# Patient Record
Sex: Male | Born: 1935 | Race: White | Hispanic: No | Marital: Married | Smoking: Never smoker
Health system: Southern US, Community
[De-identification: ages and names within clinical notes are randomized; demographics above are authoritative.]

## PROBLEM LIST (undated history)

## (undated) DIAGNOSIS — IMO0001 Reserved for inherently not codable concepts without codable children: Secondary | ICD-10-CM

## (undated) DIAGNOSIS — I429 Cardiomyopathy, unspecified: Secondary | ICD-10-CM

## (undated) DIAGNOSIS — I351 Nonrheumatic aortic (valve) insufficiency: Secondary | ICD-10-CM

## (undated) DIAGNOSIS — E785 Hyperlipidemia, unspecified: Secondary | ICD-10-CM

## (undated) DIAGNOSIS — Z95810 Presence of automatic (implantable) cardiac defibrillator: Secondary | ICD-10-CM

## (undated) DIAGNOSIS — N289 Disorder of kidney and ureter, unspecified: Secondary | ICD-10-CM

## (undated) DIAGNOSIS — J3801 Paralysis of vocal cords and larynx, unilateral: Secondary | ICD-10-CM

## (undated) DIAGNOSIS — J45909 Unspecified asthma, uncomplicated: Secondary | ICD-10-CM

## (undated) DIAGNOSIS — E039 Hypothyroidism, unspecified: Secondary | ICD-10-CM

## (undated) DIAGNOSIS — I509 Heart failure, unspecified: Secondary | ICD-10-CM

## (undated) DIAGNOSIS — C801 Malignant (primary) neoplasm, unspecified: Secondary | ICD-10-CM

## (undated) DIAGNOSIS — I4891 Unspecified atrial fibrillation: Secondary | ICD-10-CM

## (undated) DIAGNOSIS — I950 Idiopathic hypotension: Secondary | ICD-10-CM

## (undated) HISTORY — PX: BLADDER SURGERY: SHX569

## (undated) HISTORY — PX: EYE SURGERY: SHX253

## (undated) HISTORY — PX: CARDIAC DEFIBRILLATOR PLACEMENT: SHX171

---

## 2003-10-07 ENCOUNTER — Other Ambulatory Visit: Payer: Self-pay

## 2003-10-12 ENCOUNTER — Other Ambulatory Visit: Payer: Self-pay

## 2005-06-28 ENCOUNTER — Inpatient Hospital Stay: Payer: Self-pay | Admitting: Cardiology

## 2005-06-28 ENCOUNTER — Other Ambulatory Visit: Payer: Self-pay

## 2005-06-29 ENCOUNTER — Other Ambulatory Visit: Payer: Self-pay

## 2005-09-18 ENCOUNTER — Inpatient Hospital Stay (HOSPITAL_COMMUNITY): Admission: AD | Admit: 2005-09-18 | Discharge: 2005-09-25 | Payer: Self-pay | Admitting: Internal Medicine

## 2005-09-18 ENCOUNTER — Ambulatory Visit: Payer: Self-pay | Admitting: Internal Medicine

## 2005-09-29 ENCOUNTER — Ambulatory Visit: Payer: Self-pay | Admitting: Internal Medicine

## 2005-09-29 ENCOUNTER — Ambulatory Visit (HOSPITAL_COMMUNITY): Admission: RE | Admit: 2005-09-29 | Discharge: 2005-09-29 | Payer: Self-pay | Admitting: Internal Medicine

## 2005-10-09 ENCOUNTER — Ambulatory Visit: Payer: Self-pay | Admitting: Internal Medicine

## 2005-10-18 ENCOUNTER — Ambulatory Visit: Payer: Self-pay | Admitting: Internal Medicine

## 2005-10-27 ENCOUNTER — Ambulatory Visit: Payer: Self-pay | Admitting: Internal Medicine

## 2006-08-25 ENCOUNTER — Other Ambulatory Visit: Payer: Self-pay

## 2006-08-25 ENCOUNTER — Inpatient Hospital Stay: Payer: Self-pay | Admitting: Internal Medicine

## 2007-08-10 IMAGING — CT CT CHEST W/ CM
1 series · 15 of 33 positions shown, 19 images · non-contrast
Comparison: none

REASON FOR EXAM: previous CXR in [HOSPITAL] showed possible lesion in the
lingula left side
COMMENTS:

[Series 2: soft tissue · axial · 0.67mm/px · z∈[-664,-324]mm · 15 of 80 slices shown, 19 images]
[im 6/80  mediastinal]
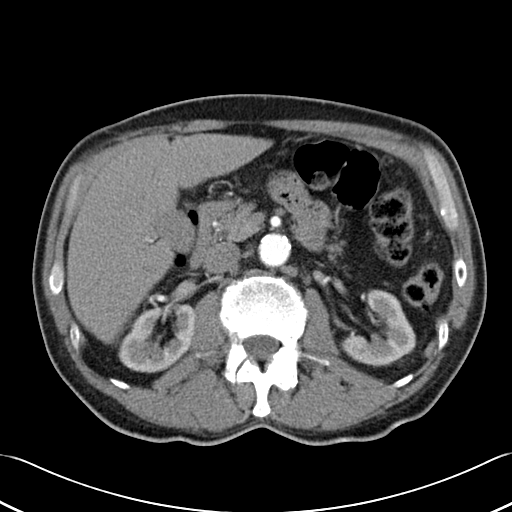
[im 6/80  lung]
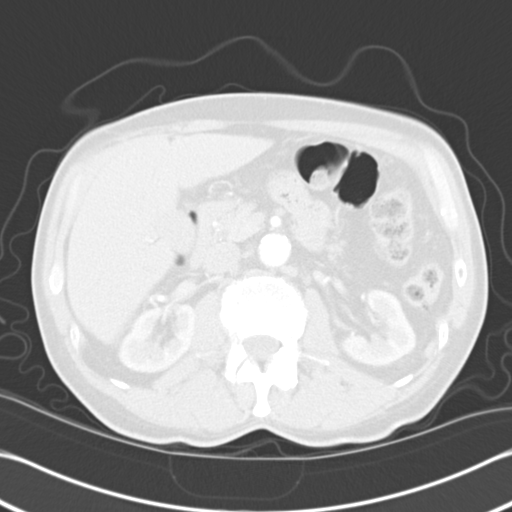
[im 12/80  lung]
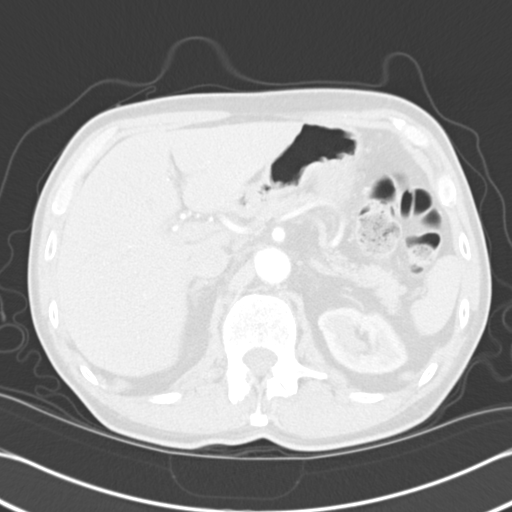
[im 16/80  lung]
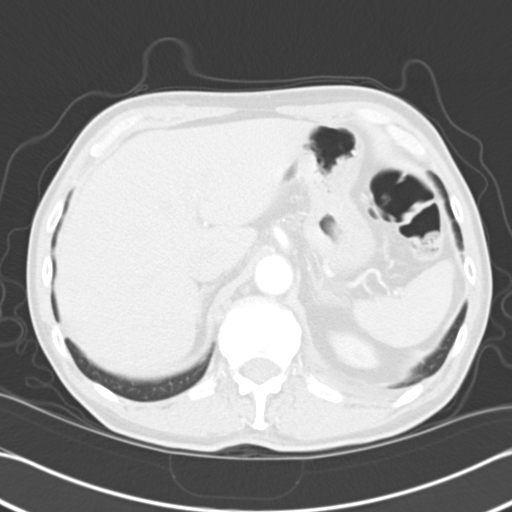
[im 21/80  lung]
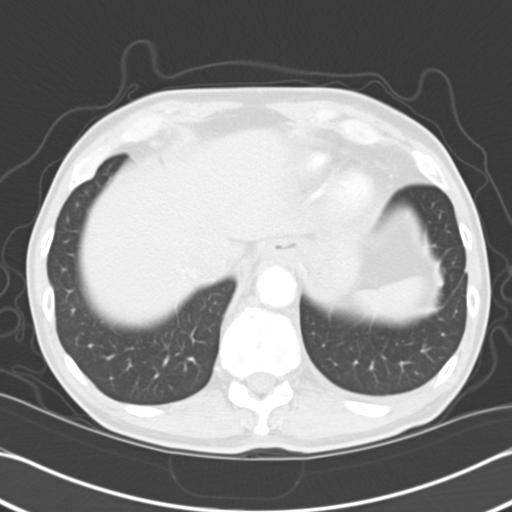
[im 27/80  mediastinal]
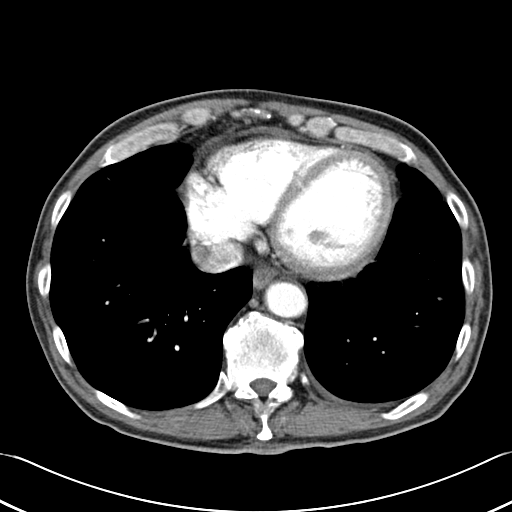
[im 27/80  lung]
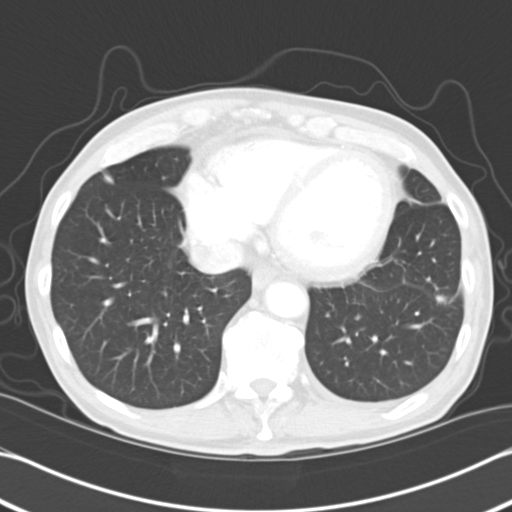
[im 32/80  lung]
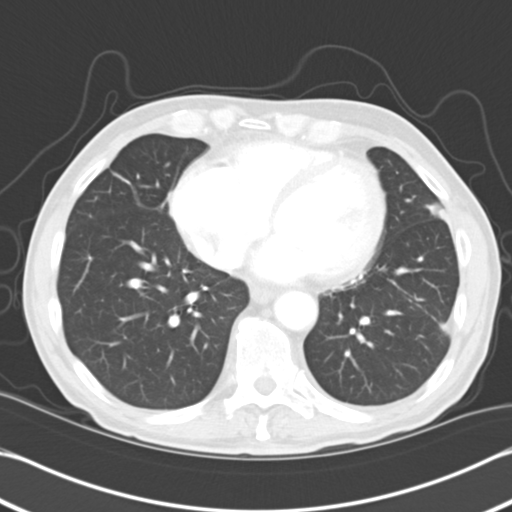
[im 36/80  lung]
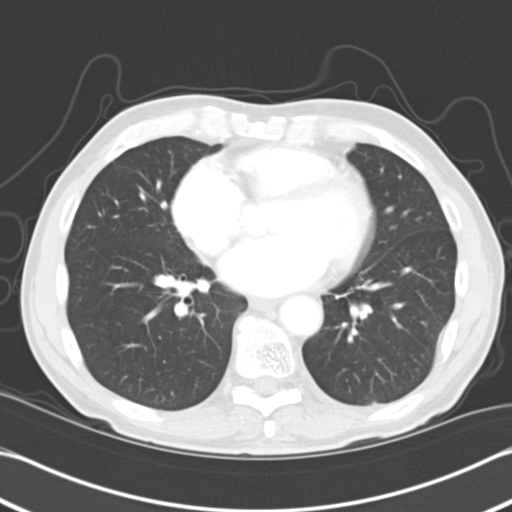
[im 41/80  lung]
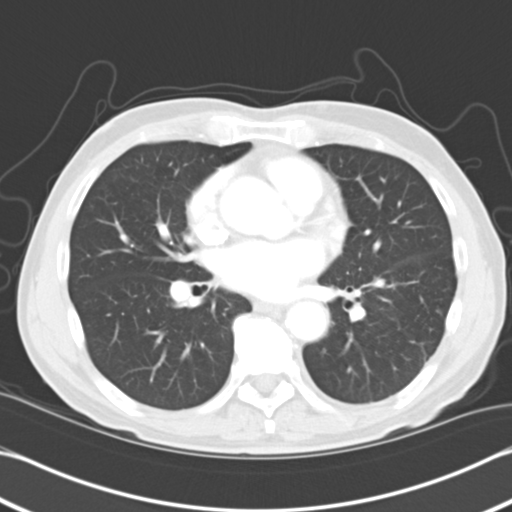
[im 44/80  mediastinal]
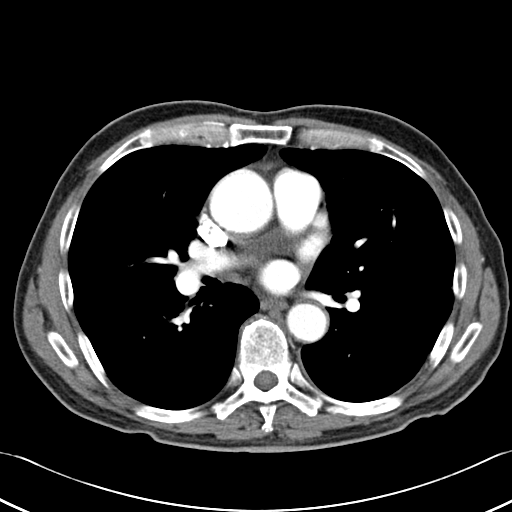
[im 44/80  lung]
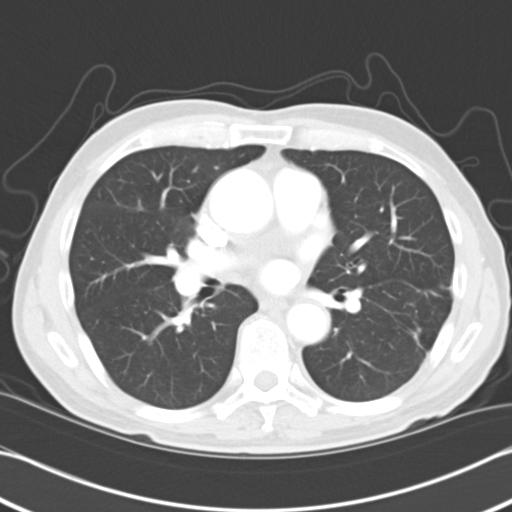
[im 48/80  lung]
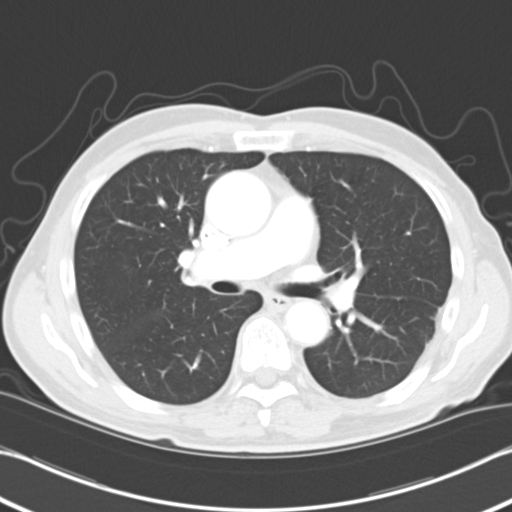
[im 53/80  lung]
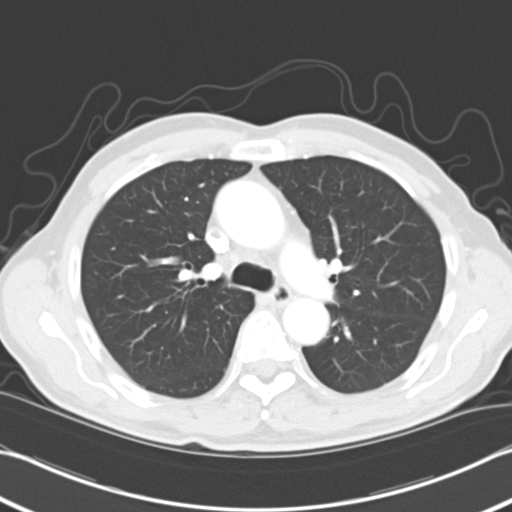
[im 59/80  lung]
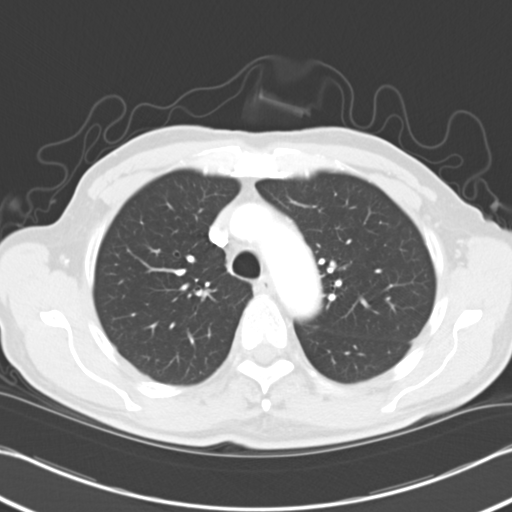
[im 64/80  mediastinal]
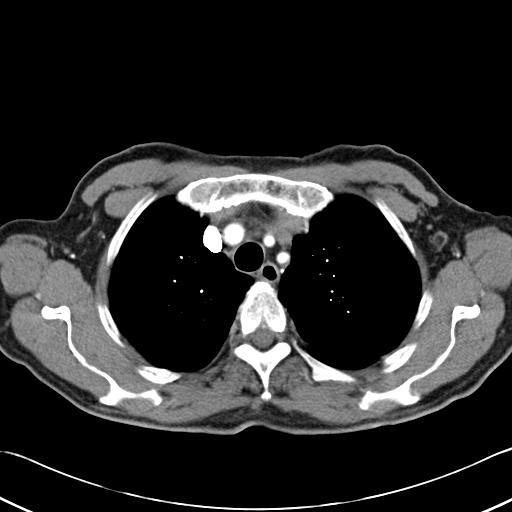
[im 64/80  lung]
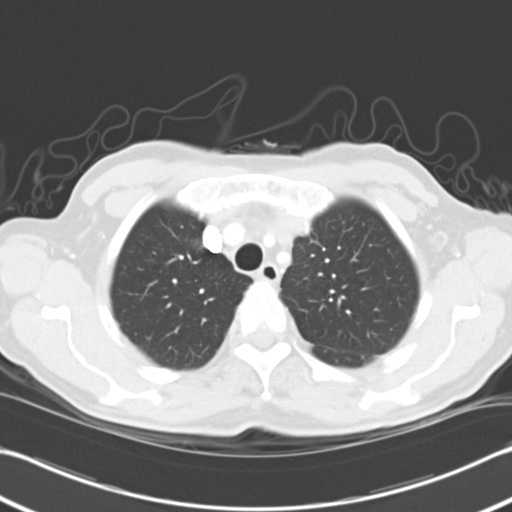
[im 68/80  lung]
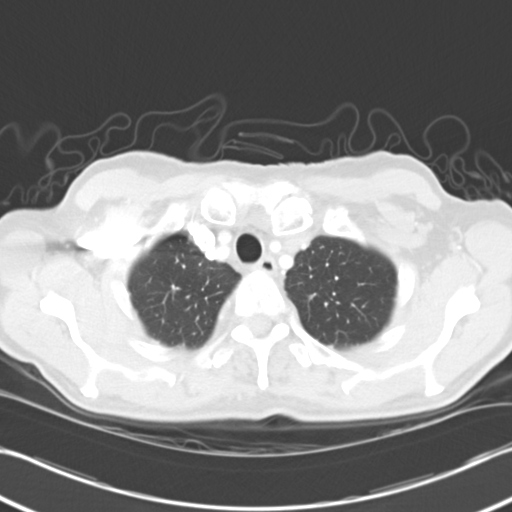
[im 74/80  lung]
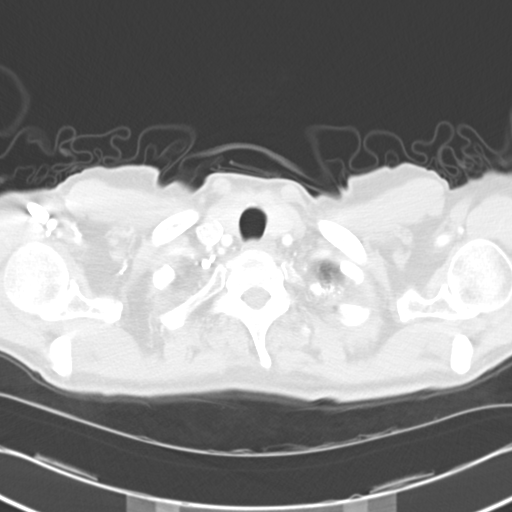

[15 of 33 positions shown; findings below may reference images not displayed]

PROCEDURE:     CT  - CT CHEST WITH CONTRAST  - October 09, 2005  [DATE]

RESULT:     IV contrast enhanced CT scan of the chest is performed.  The
previous chest x-ray from [HOSPITAL] is not available for comparison. There
are some areas of density at the lung apices suggestive of calcifications in
the LEFT lung apex.  Some degenerative spurring is seen in the thoracic
spine.  Some areas of thickening of the fissures are seen but a distinct
mass is not demonstrated. There is a low density area in the posterior
aspect of the RIGHT kidney on image #79, which is too small for further
characterization on this limited study, which is performed for evaluation of
the chest.  The other portions of the upper abdominal viscera appear to
within normal limits aside from some areas of enhancement on image #77 along
the periphery of the RIGHT lobe of the liver.  This could represent a focal
underlying lesion. Correlation with triphasic CT of the abdomen may be
beneficial.  The heart is mildly enlarged. There is no pericardial or
pleural effusion.  No axillary, mediastinal or hilar mass is present.

A small hiatal hernia is present.
IMPRESSION: 1)No definite focal pulmonary parenchymal mass.  Some linear areas of
fibrosis are noted near the base and there is some thickening in the
fissures.  A discrete pulmonary nodule is not identified.

2)Small hiatal hernia.

3)Some minimal low density in the RIGHT kidney, which could be further
evaluated with ultrasound or triphasic CT.

4)Some ill-defined enhancement laterally in the RIGHT lobe of the liver as
described on image #77, which could be further evaluated with triphasic CT.

## 2008-09-15 ENCOUNTER — Ambulatory Visit: Payer: Self-pay | Admitting: Internal Medicine

## 2008-09-27 ENCOUNTER — Emergency Department: Payer: Self-pay | Admitting: Emergency Medicine

## 2008-10-01 ENCOUNTER — Ambulatory Visit: Payer: Self-pay | Admitting: Urology

## 2012-01-29 ENCOUNTER — Ambulatory Visit: Payer: Self-pay | Admitting: Cardiology

## 2012-01-29 LAB — URINALYSIS, COMPLETE
Blood: NEGATIVE
Glucose,UR: NEGATIVE mg/dL (ref 0–75)
Ph: 5 (ref 4.5–8.0)
RBC,UR: 1 /HPF (ref 0–5)
Specific Gravity: 1.021 (ref 1.003–1.030)
WBC UR: 1 /HPF (ref 0–5)

## 2012-01-29 LAB — CBC WITH DIFFERENTIAL/PLATELET
Basophil %: 0.5 %
Eosinophil %: 0.5 %
HGB: 13.9 g/dL (ref 13.0–18.0)
Lymphocyte %: 22.8 %
MCV: 92 fL (ref 80–100)
Monocyte #: 0.5 x10 3/mm (ref 0.2–1.0)
Neutrophil %: 64.5 %
Platelet: 189 10*3/uL (ref 150–440)
WBC: 4 10*3/uL (ref 3.8–10.6)

## 2012-01-29 LAB — BASIC METABOLIC PANEL
Chloride: 106 mmol/L (ref 98–107)
Creatinine: 1.29 mg/dL (ref 0.60–1.30)
Glucose: 87 mg/dL (ref 65–99)
Osmolality: 281 (ref 275–301)

## 2012-02-02 ENCOUNTER — Ambulatory Visit: Payer: Self-pay | Admitting: Cardiology

## 2014-02-16 DIAGNOSIS — I5022 Chronic systolic (congestive) heart failure: Secondary | ICD-10-CM | POA: Diagnosis present

## 2014-02-16 DIAGNOSIS — I4891 Unspecified atrial fibrillation: Secondary | ICD-10-CM | POA: Diagnosis present

## 2014-02-16 DIAGNOSIS — I5023 Acute on chronic systolic (congestive) heart failure: Secondary | ICD-10-CM | POA: Diagnosis present

## 2014-02-16 DIAGNOSIS — I429 Cardiomyopathy, unspecified: Secondary | ICD-10-CM | POA: Insufficient documentation

## 2014-02-16 DIAGNOSIS — Z95810 Presence of automatic (implantable) cardiac defibrillator: Secondary | ICD-10-CM | POA: Insufficient documentation

## 2014-12-06 NOTE — Op Note (Signed)
PATIENT NAME:  Jack Harris, Jack Harris MR#:  [id number] DATE OF BIRTH:  May 09, 1934  DATE OF PROCEDURE:  02/02/2012  ATTENDING: Lennette Bihari L. Marcello Moores, MD   PROCEDURE: Removal and replacement of implantable cardioverter defibrillator.   INDICATION: Implantable cardioverter defibrillator at elective replacement indicator.   FLUOROSCOPY TIME: None utilized.   DETAILS OF PROCEDURE: The patient was brought to the operating room in a fasting, nonsedated state. Informed consent was obtained and placed in the patient's permanent medical record. The patient was prepped and draped in the usual manner. The area of the left infraclavicular fossa was scrubbed with chlorhexidine. Using a scalpel an incision was made over the old incision site. Using a combination of electrocautery and blunt dissection, the old ICD was extracted from the pocket. The pocket was then revised to accommodate the new device. The serial number and device information was obtained from the old device. At this juncture we took the opportunity to reconstruct the pocket to ensure that it would accommodate the new device. We then hooked the right atrial and right ventricular leads to the new device and placed this in the pocket. Testing was performed through the device and showed the following findings.   The atrial lead was tested and showed appropriate sensing and impedances and the thresholds were within normal limits. Right ventricular lead as well was tested and showed adequate sensing, impedances were within normal range and a threshold was unchanged from prior to device implant. The device was programmed as follows. The serial number for the device is as follows. The device was a [id number] [id number], serial number [id number]. The right atrial lead was a Medtronic [id number], model number [id number], implanted 04/22/2007. The right ventricular lead was a 6947 Sprint Quattro, serial number [id number] implanted 04/22/2007. No DFT testing was performed.   The new  device again was implanted 02/02/2012 and programming was as follows. The VF detection was set at 200 with 24 out of 32. There was a VT monitor zone programmed on at 150 beats per minute with no therapies. VF set for ATP during charging and max output 35 joules shocks.   At this juncture the pocket was irrigated with copious amounts of gentamicin solution and adequate hemostasis was obtained. The pocket was then closed with running layers of 2-0 and 3-0 Vicryl and the subcuticular layer with 4-0 Vicryl. Steri-Strips were applied to the incision as well as sterile gauze and OpSite. There were no complications at the conclusion of the procedure and the patient was set to be discharged two hours postprocedure based on the fact if the patient has normal vital signs and there's no significant bleeding.   ____________________________ Priscille Heidelberg. Marcello Moores, MD klt:drc D: 02/02/2012 16:14:54 ET T: 02/02/2012 17:11:27 ET JOB#: [id number]  cc: Lennette Bihari L. Marcello Moores, MD, <Dictator> Marzetta Board MD ELECTRONICALLY SIGNED 03/01/2012 11:12

## 2015-11-29 ENCOUNTER — Other Ambulatory Visit: Payer: Self-pay | Admitting: Unknown Physician Specialty

## 2015-11-29 DIAGNOSIS — J3800 Paralysis of vocal cords and larynx, unspecified: Secondary | ICD-10-CM

## 2015-12-03 ENCOUNTER — Ambulatory Visit: Admission: RE | Admit: 2015-12-03 | Payer: Medicare Other | Source: Ambulatory Visit

## 2015-12-03 ENCOUNTER — Ambulatory Visit: Payer: Medicare Other

## 2015-12-03 ENCOUNTER — Ambulatory Visit
Admission: RE | Admit: 2015-12-03 | Discharge: 2015-12-03 | Disposition: A | Discharge status: Home / Self Care | Payer: Medicare Other | Source: Ambulatory Visit | Attending: Unknown Physician Specialty | Admitting: Unknown Physician Specialty

## 2015-12-03 DIAGNOSIS — Z8551 Personal history of malignant neoplasm of bladder: Secondary | ICD-10-CM | POA: Insufficient documentation

## 2015-12-03 DIAGNOSIS — N200 Calculus of kidney: Secondary | ICD-10-CM | POA: Diagnosis not present

## 2015-12-03 DIAGNOSIS — I700 Atherosclerosis of aorta: Secondary | ICD-10-CM | POA: Insufficient documentation

## 2015-12-03 DIAGNOSIS — I2510 Atherosclerotic heart disease of native coronary artery without angina pectoris: Secondary | ICD-10-CM | POA: Insufficient documentation

## 2015-12-03 DIAGNOSIS — J3800 Paralysis of vocal cords and larynx, unspecified: Secondary | ICD-10-CM | POA: Diagnosis not present

## 2015-12-03 DIAGNOSIS — J90 Pleural effusion, not elsewhere classified: Secondary | ICD-10-CM | POA: Diagnosis not present

## 2015-12-03 HISTORY — DX: Malignant (primary) neoplasm, unspecified: C80.1

## 2015-12-03 HISTORY — DX: Heart failure, unspecified: I50.9

## 2015-12-03 LAB — POCT I-STAT CREATININE: CREATININE: 1.6 mg/dL — AB (ref 0.61–1.24)

## 2015-12-03 MED ORDER — IOPAMIDOL (ISOVUE-300) INJECTION 61%
60.0000 mL | Freq: Once | INTRAVENOUS | Status: AC | PRN
  Administered 2015-12-03: 60 mL via INTRAVENOUS

## 2016-02-29 DIAGNOSIS — Z01810 Encounter for preprocedural cardiovascular examination: Secondary | ICD-10-CM | POA: Diagnosis present

## 2016-02-29 DIAGNOSIS — Z01812 Encounter for preprocedural laboratory examination: Secondary | ICD-10-CM | POA: Diagnosis present

## 2016-02-29 LAB — POTASSIUM: Potassium: 4.5 mmol/L (ref 3.5–5.1)

## 2016-02-29 NOTE — Patient Instructions (Signed)
  Your procedure is scheduled on: March 02, 2016 (Thursday) Report to Same Day Surgery 2nd floor [facility] To find out your arrival time please call [phone] between 1PM - 3PM on March 01, 2016 (Wednesday)  Remember: Instructions that are not followed completely may result in serious medical risk, up to and including death, or upon the discretion of your surgeon and anesthesiologist your surgery may need to be rescheduled.    _x___ 1. Do not eat food or drink liquids after midnight. No gum chewing or hard candies.     _x__ 2. No Alcohol for 24 hours before or after surgery.   _x___3. No Smoking for 24 prior to surgery.   ____  4. Bring all medications with you on the day of surgery if instructed.    __x__ 5. Notify your doctor if there is any change in your medical condition     (cold, fever, infections).     Do not wear jewelry, make-up, hairpins, clips or nail polish.  Do not wear lotions, powders, or perfumes. You may wear deodorant.  Do not shave 48 hours prior to surgery. Men may shave face and neck.  Do not bring valuables to the hospital.    [facility] is not responsible for any belongings or valuables.               Contacts, dentures or bridgework may not be worn into surgery.  Leave your suitcase in the car. After surgery it may be brought to your room.  For patients admitted to the hospital, discharge time is determined by your treatment team.   Patients discharged the day of surgery will not be allowed to drive home.    Please read over the following fact sheets that you were given:   [facility]paring for [facility] and or MRSA Information   _x___ Take these medicines the morning of surgery with A SIP OF WATER:    1. Amiodarone  2.Metoprolol  3.  4.  5.  6.  ____ Fleet Enema (as directed)   ___ Use CHG Soap or sage wipes as directed on instruction sheet   ____ Use inhalers on the day of surgery and bring to hospital day of surgery  ____ Stop  metformin 2 days prior to surgery    ____ Take 1/2 of usual insulin dose the night before surgery and none on the morning of  Surgery          _x___ Stop aspirin or coumadin, or plavix (Patient stated has stopped warfarin on July 14)  _x__ Stop Anti-inflammatories such as Advil, Aleve, Ibuprofen, Motrin, Naproxen,          Naprosyn, Goodies powders or aspirin products. Ok to take Tylenol.   ____ Stop supplements until after surgery.    ____ Bring C-Pap to the hospital.

## 2016-02-29 NOTE — Pre-Procedure Instructions (Signed)
10/04/2015  [place]  Component Name Value Range  LV Ejection Fraction (%) 25   Aortic Valve Stenosis Grade none   Aortic Valve Regurgitation Grade moderate   Mitral Valve Stenosis Grade none   Mitral Valve Regurgitation Grade mild   Tricuspid Valve Regurgitation Grade mild   Tricuspid Valve Regurgitation Max Velocity (m/s) 3.3 m/sec   Right Ventricle Systolic Pressure (mmHg) XX123456 mmHg   LV End Diastolic Diameter (cm) 5.2 cm  LV End Systolic Diameter (cm) 4.4 cm  LV Septum Wall Thickness (cm) 0.7 cm  LV Posterior Wall Thickness (cm) 1 cm  Left Atrium Diameter (cm) 5.1 cm   Result Narrative                  INTERNAL MEDICINE DEPARTMENT                    [facility]                           [id number]                   [facility]                      Acct #: [id number]        [address], [place],  [place] [address]            Date: 10/04/2015 10:19 AM                                                                 Adult Male Age: 82 yrs                    ECHOCARDIOGRAM REPORT                        Outpatient          STUDY:CHEST WALL         TAPE:0000:00: 0:00:00         KC::KCWI           ECHO:Yes   DOPPLER:Yes  FILE:0000-000-000             MD1:  Gilford Rile, JOHN BARRETT          COLOR:Yes  CONTRAST:No       MACHINE:Philips           Height: 72 in      RV BIOPSY:No         3D:No    SOUND QLTY:Moderate          Weight: 165 lb         MEDIUM:None                                                                 BSA: 2.0  m2 ___________________________________________________________________________________________      HISTORY:DOE       REASON:Assess, LV function   INDICATION:Sleeps in sitting position due to orthopnea [R06.01 (ICD-10-CM)]  ___________________________________________________________________________________________ ECHOCARDIOGRAPHIC MEASUREMENTS 2D DIMENSIONS AORTA             Values       Normal Range      MAIN PA          Values      Normal Range           Annulus:  nm*       [2.3 - 2.9]                PA Main:  nm*       [1.5 - 2.1]         Aorta Sin:  nm*       [3.1 - 3.7]       RIGHT VENTRICLE       ST Junction:  nm*       [2.6 - 3.2]                RV Base:  nm*       [ < 4.2]         Asc.Aorta:  nm*       [2.6 - 3.4]                 RV Mid:  nm*       [ < 3.5]  LEFT VENTRICLE                                        RV Length:  nm*       [ < 8.6]             LVIDd:  5.2 cm    [4.2 - 5.9]       INFERIOR VENA CAVA             LVIDs:  4.4 cm                              Max. IVC:  nm*       [ <= 2.1]                FS:  15.4 %    [> 25]                    Min. IVC:  nm*               SWT:  0.70 cm   [0.6 - 1.0]                   ------------------               PWT:  1.0 cm    [0.6 - 1.0]                   nm* - not measured  LEFT ATRIUM           LA Diam:  5.1 cm    [3.0 - 4.0]       LA A4C Area:  nm*       [ < 20]         LA Volume:  nm*       Roice.Felt - 58]  ___________________________________________________________________________________________ ECHOCARDIOGRAPHIC DESCRIPTIONS  AORTIC ROOT         Size:Normal   Dissection:INDETERM FOR DISSECTION  AORTIC VALVE     Leaflets:Tricuspid            Morphology:Normal     Mobility:Fully mobile  LEFT VENTRICLE         Size:Normal                 Anterior:Normal  Contraction:Normal                  Lateral:Normal   Closest EF:25% (Estimated)          Septal:Normal    LV Masses:No Masses                Apical:Normal          [id number]                   Inferior:Normal                                    Posterior:Normal Dias.FxClass:can't be determined  MITRAL VALVE     Leaflets:Normal                 Mobility:Fully mobile   Morphology:Normal  LEFT ATRIUM         Size:MODERATELY ENLARGED   LA Masses:No masses    IA Septum:Normal IAS  MAIN PA         Size:Normal  PULMONIC VALVE   Morphology:Normal                  Mobility:Fully mobile  RIGHT VENTRICLE    RV Masses:No Masses                  Size:MODERATELY ENLARGED    Free Wall:HYPOCONTRACTILE     Contraction:Normal  TRICUSPID VALVE     Leaflets:Normal                 Mobility:Fully mobile   Morphology:Normal  RIGHT ATRIUM         Size:MODERATELY ENLARGED    RA Other:CATHETER      RA Mass:No masses  PERICARDIUM        Fluid:No effusion  INFERIOR VENACAVA         Size:DILATED Normal respiratory collapse   ________________________________________________________________ DOPPLER ECHO and OTHER SPECIAL PROCEDURES    Aortic:MODERATE AR                No AS     Mitral:MILD MR                    No MS           MV Inflow E Vel=nm*        MV Annulus E'Vel=nm*           E/E'Ratio=nm*  Tricuspid:MILD TR                    No TS           331.0 cm/sec peak TR vel   53.8 mmHg peak RV pressure  Pulmonary:TRIVIAL PR                 No PS     ___________________________________________________________________________________________ INTERPRETATION SEVERE LV SYSTOLIC DYSFUNCTION (See above) MILD RV SYSTOLIC DYSFUNCTION (See above) MODERATE VALVULAR REGURGITATION (See above) NO VALVULAR STENOSIS   ___________________________________________________________________________________________ Electronically signed  by: MD Miquel Dunn on 10/05/2015 04:16 PM             Performed By: Scherrie November, RCS       Ordering Physician: Myrla Halsted  ___________________________________________________________________________________________   Status Results Details   Encounter Summary   Copy of echo

## 2016-02-29 NOTE — Pre-Procedure Instructions (Signed)
PREOP EKG FAXED TO DR Nehemiah Massed FOR REVIEW. HAD ALREADY RECEIVED CLEARANCE

## 2016-03-01 ENCOUNTER — Encounter
Admission: RE | Admit: 2016-03-01 | Discharge: 2016-03-01 | Disposition: A | Discharge status: Home / Self Care | Payer: 59 | Source: Ambulatory Visit | Attending: Unknown Physician Specialty | Admitting: Unknown Physician Specialty

## 2016-03-01 DIAGNOSIS — Z01810 Encounter for preprocedural cardiovascular examination: Secondary | ICD-10-CM | POA: Insufficient documentation

## 2016-03-01 DIAGNOSIS — Z01812 Encounter for preprocedural laboratory examination: Secondary | ICD-10-CM | POA: Insufficient documentation

## 2016-03-01 HISTORY — DX: Nonrheumatic aortic (valve) insufficiency: I35.1

## 2016-03-01 HISTORY — DX: Hyperlipidemia, unspecified: E78.5

## 2016-03-01 HISTORY — DX: Paralysis of vocal cords and larynx, unilateral: J38.01

## 2016-03-01 HISTORY — DX: Hypothyroidism, unspecified: E03.9

## 2016-03-01 HISTORY — DX: Reserved for inherently not codable concepts without codable children: IMO0001

## 2016-03-01 HISTORY — DX: Unspecified atrial fibrillation: I48.91

## 2016-03-01 HISTORY — DX: Unspecified asthma, uncomplicated: J45.909

## 2016-03-01 HISTORY — DX: Presence of automatic (implantable) cardiac defibrillator: Z95.810

## 2016-03-01 HISTORY — DX: Cardiomyopathy, unspecified: I42.9

## 2016-03-01 LAB — PROTIME-INR
INR: 1.69
PROTHROMBIN TIME: 19.9 s — AB (ref 11.4–15.0)

## 2016-03-02 ENCOUNTER — Ambulatory Visit: Payer: Medicare Other | Admitting: Anesthesiology

## 2016-03-02 ENCOUNTER — Ambulatory Visit
Admission: RE | Admit: 2016-03-02 | Discharge: 2016-03-02 | Disposition: A | Discharge status: Home / Self Care | Payer: Medicare Other | Source: Ambulatory Visit | Attending: Unknown Physician Specialty | Admitting: Unknown Physician Specialty

## 2016-03-02 ENCOUNTER — Encounter: Payer: Self-pay | Admitting: *Deleted

## 2016-03-02 ENCOUNTER — Encounter
Admission: RE | Disposition: A | Discharge status: Home / Self Care | Payer: Self-pay | Source: Ambulatory Visit | Attending: Unknown Physician Specialty

## 2016-03-02 DIAGNOSIS — I429 Cardiomyopathy, unspecified: Secondary | ICD-10-CM | POA: Diagnosis not present

## 2016-03-02 DIAGNOSIS — I351 Nonrheumatic aortic (valve) insufficiency: Secondary | ICD-10-CM | POA: Insufficient documentation

## 2016-03-02 DIAGNOSIS — Z8709 Personal history of other diseases of the respiratory system: Secondary | ICD-10-CM | POA: Insufficient documentation

## 2016-03-02 DIAGNOSIS — I509 Heart failure, unspecified: Secondary | ICD-10-CM | POA: Diagnosis not present

## 2016-03-02 DIAGNOSIS — I4891 Unspecified atrial fibrillation: Secondary | ICD-10-CM | POA: Diagnosis not present

## 2016-03-02 DIAGNOSIS — Z95810 Presence of automatic (implantable) cardiac defibrillator: Secondary | ICD-10-CM | POA: Insufficient documentation

## 2016-03-02 DIAGNOSIS — E039 Hypothyroidism, unspecified: Secondary | ICD-10-CM | POA: Diagnosis not present

## 2016-03-02 DIAGNOSIS — R490 Dysphonia: Secondary | ICD-10-CM | POA: Insufficient documentation

## 2016-03-02 DIAGNOSIS — J3801 Paralysis of vocal cords and larynx, unilateral: Secondary | ICD-10-CM | POA: Diagnosis not present

## 2016-03-02 DIAGNOSIS — E785 Hyperlipidemia, unspecified: Secondary | ICD-10-CM | POA: Insufficient documentation

## 2016-03-02 DIAGNOSIS — Z85828 Personal history of other malignant neoplasm of skin: Secondary | ICD-10-CM | POA: Diagnosis not present

## 2016-03-02 DIAGNOSIS — R0602 Shortness of breath: Secondary | ICD-10-CM | POA: Diagnosis not present

## 2016-03-02 DIAGNOSIS — Z8551 Personal history of malignant neoplasm of bladder: Secondary | ICD-10-CM | POA: Diagnosis not present

## 2016-03-02 HISTORY — PX: THYROPLASTY: SHX2512

## 2016-03-02 SURGERY — THYROPLASTY
Anesthesia: General | Laterality: Left

## 2016-03-02 MED ORDER — FAMOTIDINE 20 MG PO TABS
20.0000 mg | ORAL_TABLET | Freq: Once | ORAL | Status: AC
  Administered 2016-03-02: 20 mg via ORAL

## 2016-03-02 MED ORDER — FENTANYL CITRATE (PF) 100 MCG/2ML IJ SOLN
INTRAMUSCULAR | Status: DC | PRN
  Administered 2016-03-02: 50 ug via INTRAVENOUS

## 2016-03-02 MED ORDER — EPINEPHRINE HCL 1 MG/ML IJ SOLN
INTRAMUSCULAR | Status: AC
  Filled 2016-03-02: qty 1

## 2016-03-02 MED ORDER — FENTANYL CITRATE (PF) 100 MCG/2ML IJ SOLN: 25.0000 ug | INTRAMUSCULAR | Status: DC | PRN

## 2016-03-02 MED ORDER — FAMOTIDINE 20 MG PO TABS
ORAL_TABLET | ORAL | Status: AC
  Filled 2016-03-02: qty 1

## 2016-03-02 MED ORDER — PROPOFOL 10 MG/ML IV BOLUS
INTRAVENOUS | Status: DC | PRN
  Administered 2016-03-02: 20 mg via INTRAVENOUS

## 2016-03-02 MED ORDER — OXYCODONE HCL 5 MG/5ML PO SOLN: 5.0000 mg | Freq: Once | ORAL | Status: DC | PRN

## 2016-03-02 MED ORDER — LIDOCAINE 2% (20 MG/ML) 5 ML SYRINGE
INTRAMUSCULAR | Status: DC | PRN
  Administered 2016-03-02: 50 mg via INTRAVENOUS

## 2016-03-02 MED ORDER — LACTATED RINGERS IV SOLN
INTRAVENOUS | Status: DC
  Administered 2016-03-02: 06:00:00 via INTRAVENOUS

## 2016-03-02 MED ORDER — OXYCODONE HCL 5 MG PO TABS: 5.0000 mg | ORAL_TABLET | Freq: Once | ORAL | Status: DC | PRN

## 2016-03-02 MED ORDER — LIDOCAINE-EPINEPHRINE 1 %-1:100000 IJ SOLN
INTRAMUSCULAR | Status: AC
  Filled 2016-03-02: qty 1

## 2016-03-02 MED ORDER — LIDOCAINE-EPINEPHRINE 1 %-1:100000 IJ SOLN
INTRAMUSCULAR | Status: DC | PRN
  Administered 2016-03-02: 4 mL

## 2016-03-02 MED ORDER — BACITRACIN ZINC 500 UNIT/GM EX OINT
TOPICAL_OINTMENT | CUTANEOUS | Status: AC
  Filled 2016-03-02: qty 28.35

## 2016-03-02 MED ORDER — OXYCODONE-ACETAMINOPHEN 5-325 MG PO TABS: 1.0000 | ORAL_TABLET | ORAL | Status: DC | PRN

## 2016-03-02 MED ORDER — CEPHALEXIN 500 MG PO CAPS: 500.0000 mg | ORAL_CAPSULE | Freq: Three times a day (TID) | ORAL | Status: DC

## 2016-03-02 MED ORDER — MIDAZOLAM HCL 5 MG/5ML IJ SOLN
INTRAMUSCULAR | Status: DC | PRN
  Administered 2016-03-02: 1 mg via INTRAVENOUS

## 2016-03-02 SURGICAL SUPPLY — 43 items
BLADE OSC/SAGITTAL MD 5.5X18 (BLADE) ×2 IMPLANT
BLADE SAW 2.5X18X0.5 (BLADE) ×2 IMPLANT
BLADE SURG 15 STRL LF DISP TIS (BLADE) ×1 IMPLANT
BLADE SURG 15 STRL SS (BLADE) ×3
CANISTER SUCT 1200ML W/VALVE (MISCELLANEOUS) ×3 IMPLANT
CORD BIP STRL DISP 12FT (MISCELLANEOUS) ×3 IMPLANT
DRAPE MAG INST 16X20 L/F (DRAPES) ×3 IMPLANT
ELECT REM PT RETURN 9FT ADLT (ELECTROSURGICAL) ×3
ELECTRODE REM PT RTRN 9FT ADLT (ELECTROSURGICAL) ×1 IMPLANT
FORCEPS JEWEL BIP 4-3/4 STR (INSTRUMENTS) ×3 IMPLANT
GLOVE BIO SURGEON STRL SZ7.5 (GLOVE) ×3 IMPLANT
GOWN STRL REUS W/ TWL LRG LVL3 (GOWN DISPOSABLE) ×2 IMPLANT
GOWN STRL REUS W/TWL LRG LVL3 (GOWN DISPOSABLE) ×6
HARMONIC SCALPEL FOCUS (MISCELLANEOUS) ×3 IMPLANT
JACKSON PRATT 10 (INSTRUMENTS) ×1 IMPLANT
LABEL OR SOLS (LABEL) ×1 IMPLANT
LIQUID BAND (GAUZE/BANDAGES/DRESSINGS) ×3 IMPLANT
LOOP RED MAXI  1X406MM (MISCELLANEOUS)
LOOP VESSEL MAXI 1X406 RED (MISCELLANEOUS) ×1 IMPLANT
MARKER SKIN DUAL TIP RULER LAB (MISCELLANEOUS) ×3 IMPLANT
MONTOGOMERY THYROPLASTY IMPLANT SYSTEM MEASURING DEVICE KIT ×4 IMPLANT
NDL FILTER BLUNT 18X1 1/2 (NEEDLE) ×1 IMPLANT
NEEDLE FILTER BLUNT 18X 1/2SAF (NEEDLE) ×2
NEEDLE FILTER BLUNT 18X1 1/2 (NEEDLE) ×1 IMPLANT
NS IRRIG 500ML POUR BTL (IV SOLUTION) ×3 IMPLANT
PACK HEAD/NECK (MISCELLANEOUS) ×3 IMPLANT
PATTIES SURGICAL .5 X.5 (GAUZE/BANDAGES/DRESSINGS) ×3 IMPLANT
RETRACTOR STAY HOOK 5MM (MISCELLANEOUS) ×1 IMPLANT
SPONGE XRAY 4X4 16PLY STRL (MISCELLANEOUS) ×3 IMPLANT
STAPLER SKIN PROX 35W (STAPLE) ×1 IMPLANT
SUCTION FRAZIER HANDLE 10FR (MISCELLANEOUS) ×2
SUCTION TUBE FRAZIER 10FR DISP (MISCELLANEOUS) ×1 IMPLANT
SUT SILK 0 (SUTURE)
SUT SILK 0 30XBRD TIE 6 (SUTURE) ×1 IMPLANT
SUT SILK 2 0 (SUTURE) ×3
SUT SILK 2-0 30XBRD TIE 12 (SUTURE) ×1 IMPLANT
SUT VIC AB 3-0 SH 27 (SUTURE)
SUT VIC AB 3-0 SH 27X BRD (SUTURE) ×2 IMPLANT
SUT VIC AB 4-0 RB1 18 (SUTURE) ×6 IMPLANT
SYR 3ML LL SCALE MARK (SYRINGE) ×3 IMPLANT
SYSTEM CHEST DRAIN TLS 7FR (DRAIN) ×3 IMPLANT
THYROPLASTY /INST/S (MISCELLANEOUS) ×3 IMPLANT
Thyroplasty Implant Male 9 ×2 IMPLANT

## 2016-03-02 NOTE — Anesthesia Postprocedure Evaluation (Signed)
Anesthesia Post Note  Patient: Jack Harris [facility]  Procedure(s) Performed: Procedure(s) (LRB): THYROPLASTY (Left)  Patient location during evaluation: PACU Anesthesia Type: General Level of consciousness: awake and alert Pain management: pain level controlled Vital Signs Assessment: post-procedure vital signs reviewed and stable Respiratory status: spontaneous breathing, nonlabored ventilation, respiratory function stable and patient connected to nasal cannula oxygen Cardiovascular status: blood pressure returned to baseline and stable Postop Assessment: no signs of nausea or vomiting Anesthetic complications: no    Last Vitals:  Filed Vitals:   03/02/16 0852 03/02/16 0900  BP: 116/91 118/96  Pulse: 91 91  Temp: 36.6 C 36.4 C  Resp: 22 18    Last Pain:  Filed Vitals:   03/02/16 0906  PainSc: 0-No pain                 Jack Harris

## 2016-03-02 NOTE — H&P (Signed)
  H+P  Reviewed and will be scanned in later. No changes noted. 

## 2016-03-02 NOTE — Op Note (Signed)
03/02/2016  8:23 AM    Latanya Maudlin  AS:[id number]   Pre-Op Dx: HOARSENESS, VOCAL CORD PARALYSIS  Post-op Dx: SAME  Proc: Left thyroplasty using the [employer] thyroplasty implant system   Surg:  Tamanna Whitson T   Asst.: Vaught  Anes:  GOT  EBL:  Less than 10 cc  Comp:  None  Findings:  Normal neck anatomy  Procedure: Mr. Kump was identified in the holding area taken the operating room placed in supine position. The anterior neck was prepped and draped sterilely. Incision line was marked over the left lateral aspect of the thyroid cartilage. A local anesthetic of 1% lidocaine with 1 100,000 epinephrine was used to inject along the incision line a total of 4 cc was used. Approximate 5 minutes a 15 blade was used to incise down to and through the platysma muscle. Hemostasis was achieved using the bipolar cautery. The strap muscles were divided in the midline and retracted over the left lateral aspect of the thyroid cartilage the thyrohyoid muscle was identified and transected inferiorly allowing access to the left lateral alae of the thyroid cartilage. Using the [facility] thyroplasty implant system the key point was identified on the lateral aspect of the thyroid cartilage. The window was then marked and then using the Stryker saw the window was created. The perichondrium on the inner surface of the thyroid a left was gently elevated. Using the sizing tools starting at a #8 and going up to a #12 were all inserted into the window it was felt that the #9 gave the best voice with the least stridor. Therefore #9 implant was placed through the window and snapped nicely into position. This gave excellent voice with minimal stridor. The wounds and copiously irrigated with saline with no active bleeding a #7 TLS drain was brought of the wound inferiorly the thyrohyoid muscle was reattached using 4-0 Vicryl strap muscles were reapproximated in the midline platysma layer was closed using 4-0  Vicryl subcutaneous later closed using 4-0 Vicryl and the skin closed using skin glue. The patient was in return anesthesia where he was awakened in the operating room taken recovery room in stable condition  Cultures: None  Specimen: None  Dispo:   Good  Plan:  Discharge to home follow-up 1 day for drain removal  Latacha Texeira T  03/02/2016 8:23 AM

## 2016-03-02 NOTE — Discharge Instructions (Signed)
Change Drain every 6 to 8 hours   AMBULATORY SURGERY  DISCHARGE INSTRUCTIONS   1) The drugs that you were given will stay in your system until tomorrow so for the next 24 hours you should not:  A) Drive an automobile B) Make any legal decisions C) Drink any alcoholic beverage   2) You may resume regular meals tomorrow.  Today it is better to start with liquids and gradually work up to solid foods.  You may eat anything you prefer, but it is better to start with liquids, then soup and crackers, and gradually work up to solid foods.   3) Please notify your doctor immediately if you have any unusual bleeding, trouble breathing, redness and pain at the surgery site, drainage, fever, or pain not relieved by medication.    4) Additional Instructions:        Please contact your physician with any problems or Same Day Surgery at [phone], Monday through Friday 6 am to 4 pm, or [place] at [facility] number at [phone].

## 2016-03-02 NOTE — Transfer of Care (Signed)
Immediate Anesthesia Transfer of Care Note  Patient: Jack Harris [facility]  Procedure(s) Performed: Procedure(s): THYROPLASTY (Left)  Patient Location: PACU  Anesthesia Type:MAC  Level of Consciousness: awake and alert   Airway & Oxygen Therapy: Patient Spontanous Breathing  Post-op Assessment: Report given to RN and Post -op Vital signs reviewed and stable  Post vital signs: Reviewed  Last Vitals:  Filed Vitals:   03/02/16 0610 03/02/16 0823  BP: 120/79 119/88  Pulse: 96 91  Temp: 36.6 C 36.7 C  Resp: 16 10    Last Pain: There were no vitals filed for this visit.       Complications: No apparent anesthesia complications

## 2016-03-02 NOTE — Anesthesia Preprocedure Evaluation (Signed)
Anesthesia Evaluation  Patient identified by MRN, date of birth, ID band Patient awake    Reviewed: Allergy & Precautions, H&P , NPO status , Patient's Chart, lab work & pertinent test results  History of Anesthesia Complications Negative for: history of anesthetic complications  Airway Mallampati: II  TM Distance: >3 FB Neck ROM: limited    Dental  (+) Poor Dentition, Chipped, Missing, Upper Dentures, Lower Dentures   Pulmonary shortness of breath, asthma ,    Pulmonary exam normal breath sounds clear to auscultation       Cardiovascular Exercise Tolerance: Poor (-) angina+CHF and + DOE  (-) Past MI Normal cardiovascular exam+ pacemaker + Cardiac Defibrillator III Rhythm:regular Rate:Normal     Neuro/Psych negative neurological ROS  negative psych ROS   GI/Hepatic negative GI ROS, Neg liver ROS, neg GERD  ,  Endo/Other  Hypothyroidism   Renal/GU negative Renal ROS  negative genitourinary   Musculoskeletal   Abdominal   Peds  Hematology negative hematology ROS (+)   Anesthesia Other Findings Past Medical History:   CHF (congestive heart failure) (HCC)                         Aortic insufficiency                                         Cardiomyopathy (HCC)                                         Atrial fibrillation (HCC)                                    Shortness of breath dyspnea                                  Asthma                                                         Comment:childhood asthma   Hypothyroidism                                               Paralysis of vocal cords and larynx, unilateral              Cancer ([place])                                                   Comment:bladder, 10 yrs ago   Cancer ([place])  Comment:squamous cell carcinoma   Hyperlipidemia                                               AICD (automatic  cardioverter/defibrillator) pr*                Comment:MEDTRONIC  Past Surgical History:   BLADDER SURGERY                                                 Comment:Surgeon:Dr. Bernardo Heater, Location [facility]    EYE SURGERY                                     Bilateral                Comment:Cataract Extraction with [place]     Reproductive/Obstetrics negative OB ROS                             Anesthesia Physical Anesthesia Plan  ASA: IV  Anesthesia Plan: MAC   Post-op Pain Management:    Induction:   Airway Management Planned:   Additional Equipment:   Intra-op Plan:   Post-operative Plan:   Informed Consent: I have reviewed the patients History and Physical, chart, labs and discussed the procedure including the risks, benefits and alternatives for the proposed anesthesia with the patient or authorized representative who has indicated his/her understanding and acceptance.   Dental Advisory Given  Plan Discussed with: Anesthesiologist, CRNA and Surgeon  Anesthesia Plan Comments:         Anesthesia Quick Evaluation

## 2016-03-06 ENCOUNTER — Encounter: Payer: Self-pay | Admitting: Unknown Physician Specialty

## 2016-05-02 ENCOUNTER — Ambulatory Visit: Payer: 59 | Attending: Unknown Physician Specialty | Admitting: Speech Pathology

## 2016-05-02 DIAGNOSIS — R490 Dysphonia: Secondary | ICD-10-CM | POA: Insufficient documentation

## 2016-05-03 ENCOUNTER — Encounter: Payer: Self-pay | Admitting: Speech Pathology

## 2016-05-03 NOTE — Therapy (Signed)
[place] [facility] MAIN [facility] SERVICES [address] [place], [place], [address] Phone: [phone]   Fax:  [phone]  Speech Language Pathology Evaluation  Patient Details  Name: Jack Harris MRN: [id number] Date of Birth: April 03, 1934 Referring Provider: Dr. Jenne Campus  Encounter Date: 05/02/2016      End of Session - 05/03/16 1317    Visit Number 1   Number of Visits 17   Date for SLP Re-Evaluation 07/02/16   SLP Start Time 1400   SLP Stop Time  1500   SLP Time Calculation (min) 60 min   Activity Tolerance Patient tolerated treatment well      Past Medical History:  Diagnosis Date  . AICD (automatic cardioverter/defibrillator) present    MEDTRONIC  . Aortic insufficiency   . Asthma    childhood asthma  . Atrial fibrillation (HCC)   . Cancer (HCC)    bladder, 10 yrs ago  . Cancer (HCC)    squamous cell carcinoma  . Cardiomyopathy (HCC)   . CHF (congestive heart failure) (HCC)   . Hyperlipidemia   . Hypothyroidism   . Paralysis of vocal cords and larynx, unilateral   . Shortness of breath dyspnea     Past Surgical History:  Procedure Laterality Date  . BLADDER SURGERY     Surgeon:Dr. Lonna Cobb, Location [facility]   . CARDIAC DEFIBRILLATOR PLACEMENT     [facility]  . EYE SURGERY Bilateral    Cataract Extraction with IOL  . THYROPLASTY Left 03/02/2016   Procedure: THYROPLASTY;  Surgeon: Linus Salmons, MD;  Location: [facility] ORS;  Service: ENT;  Laterality: Left;    There were no vitals filed for this visit.          SLP Evaluation [facility] - 05/03/16 0001      SLP Visit Information   SLP Received On 05/02/16   Referring Provider Dr. Jenne Campus   Onset Date 04/19/2016   Medical Diagnosis Left vocal cord paralysis     Subjective   Subjective "I just want to be able to talk"     General Information   HPI 11/08/2015 the patient was seen by his internist secondary "lost his voice back in mid feb. states he was given lasix for  increased fluid and it affected his voice. states it has never returned. denies cough or cold symptoms"  He was referred to ENT (Dr. Jenne Campus) and seen 12/20/2015 and was found to have left vocal cord paralysis.  Dr. Jenne Campus performed left thyroplasty 03/02/2016.  The patient states he voice has never returned.     Prior Functional Status   Cognitive/Linguistic Baseline Within functional limits     Oral Motor/Sensory Function   Overall Oral Motor/Sensory Function Appears within functional limits for tasks assessed     Motor Speech   Overall Motor Speech Impaired   Respiration Impaired   Level of Impairment Conversation   Phonation Aphonic;Breathy;Hoarse;Low vocal intensity   Resonance Within functional limits   Articulation Within functional limitis   Intelligibility Intelligible   Phonation Impaired   Vocal Abuses --  Chronic whispering   Tension Present Jaw;Neck;Shoulder   Volume Soft   Pitch High     Standardized Assessments   Standardized Assessments  Other Assessment  Perceptual Voice Evaluation      Perceptual Voice Evaluation  Voice checklist: . Health risks: does not identify health risks . Characteristic voice use: speaks in large groups (Sunday School, giving reports to groups he belongs to) . Environmental risks: wife is  hard of hearing with loud TV . Misuse: strained voice . Abuse: whsipering . Vocal characteristics: breathy, aphonic, hypophonic, limited voice range  Maximum phonation time for sustained "ah": 4 seconds  Average fundamental frequency during sustained "ah": 170 Hz (1.5 STD above average for age and gender)   Average time patient was able to sustain /s/: 10 seconds  Average time patient was able to sustain /z/: Not able to generate voiced fricative  s/z ratio : N/A  Visi-Pitch: Multi-Dimensional Voice Program (MDVP)  MDVPT extracts objective quantitative values (Relative Average Perturbation, Shimmer, Voice Turbulence Index, and Noise to Harmonic  Ratio) on sustained phonation, which are displayed graphically and numerically in comparison to a built-in normative database.  The patient exhibited values outside the norm for Relative Average Perturbation, Shimmer, Voice Turbulence Index, and Noise to Harmonic Ratio.  Average fundamental frequency was 1.5 STD above the average for age and gender. The patient improved all parameters when cued to alter voicing (whooping, siren sound)   Stimulability: Improved voicing with whooping/siren sound; strong voluntary cough      SLP Education - May 17, 2016 1315    Education provided Yes   Education Details Role of speech therapy in voice remediation   Person(s) Educated Patient   Methods Explanation   Comprehension Verbalized understanding            SLP Long Term Goals - May 17, 2016 1320      SLP LONG TERM GOAL #1   Title The patient will demonstrate independent understanding of vocal hygiene concepts and neck, shoulder, lingual stretching exercises.   Time 8   Period Weeks   Status New     SLP LONG TERM GOAL #2   Title The patient will be independent for abdominal breathing and breath support exercises.   Time 8   Period Weeks   Status New     SLP LONG TERM GOAL #3   Title The patient will minimize vocal tension via Yawn-Sigh approach (or comparable technique) with min SLP cues with 80% accuracy.   Time 8   Period Weeks   Status New     SLP LONG TERM GOAL #4   Title The patient will maintain relaxed phonation / oral resonance for paragraph length recitation with 80% accuracy.   Time 8   Period Weeks   Status New          Plan - 2016-05-17 1318    Clinical Impression Statement This 82 year old man with left vocal fold paralysis, S/P left thyroplasty 03/02/2016, is presenting with severe dysphonia.  The patient demonstrates hoarse/breathy vocal quality, reduced breath control for speech, strained/tense phonation, limited pitch range, and laryngeal tension. He will benefit from voice  therapy for education, to improve breath support, improve tone focus, promote easy flow phonation, and learn techniques to increase loudness and pitch range without strain.   Speech Therapy Frequency 2x / week   Duration Other (comment)  8 weeks   Treatment/Interventions Patient/family education;SLP instruction and feedback;Other (comment)  Voice therapy   Potential to Achieve Goals Good   Potential Considerations Ability to learn/carryover information;Co-morbidities;Cooperation/participation level;Previous level of function;Severity of impairments;Family/community support   SLP Home Exercise Plan Breath support exercises, trills   Consulted and Agree with Plan of Care Patient      Patient will benefit from skilled therapeutic intervention in order to improve the following deficits and impairments:   Dysphonia - Plan: SLP plan of care cert/re-cert      G-Codes - May 17, 2016 1322  Functional Assessment Tool Used Perceptual Voice Evaluation, clinical judgment   Functional Limitations Voice   Voice Current Status [phone]) At least 60 percent but less than 80 percent impaired, limited or restricted   Voice Goal Status ([id number]) At least 20 percent but less than 40 percent impaired, limited or restricted      Problem List There are no active problems to display for this patient.  Dollene Primrose, MS/CCC- SLP  Leandrew Koyanagi 05/03/2016, Reece Leader PM  [place] [facility] MAIN [facility] SERVICES [address] [place], [place], [address] Phone: [phone]   Fax:  [phone]  Name: MERCURY GATCHELL MRN: [id number] Date of Birth: April 19, 1934

## 2016-05-09 ENCOUNTER — Ambulatory Visit: Payer: 59 | Admitting: Speech Pathology

## 2016-05-18 ENCOUNTER — Ambulatory Visit: Payer: 59 | Attending: Unknown Physician Specialty | Admitting: Speech Pathology

## 2016-05-18 ENCOUNTER — Encounter: Payer: Self-pay | Admitting: Speech Pathology

## 2016-05-18 DIAGNOSIS — R490 Dysphonia: Secondary | ICD-10-CM | POA: Diagnosis present

## 2016-05-18 NOTE — Therapy (Signed)
[place] MAIN [facility] SERVICES [address] [place], [place], [address] Phone: [phone]   Fax:  [phone]  Speech Language Pathology Treatment  Patient Details  Name: Jack Harris MRN: [id number] Date of Birth: August 14, 1934 Referring Provider: Dr. Tami Ribas  Encounter Date: 05/18/2016      End of Session - 05/18/16 1017    Visit Number 2   Number of Visits 17   Date for SLP Re-Evaluation 07/02/16   SLP Start Time 0900   SLP Stop Time  0957   SLP Time Calculation (min) 57 min      Past Medical History:  Diagnosis Date  . AICD (automatic cardioverter/defibrillator) present    MEDTRONIC  . Aortic insufficiency   . Asthma    childhood asthma  . Atrial fibrillation ([place])   . Cancer ([place])    bladder, 10 yrs ago  . Cancer (HCC)    squamous cell carcinoma  . Cardiomyopathy ([place])   . CHF (congestive heart failure) ([place])   . Hyperlipidemia   . Hypothyroidism   . Paralysis of vocal cords and larynx, unilateral   . Shortness of breath dyspnea     Past Surgical History:  Procedure Laterality Date  . BLADDER SURGERY     Surgeon:Dr. Bernardo Heater, Location [place]   . [place]  . EYE SURGERY Bilateral    Cataract Extraction with IOL  . THYROPLASTY Left 03/02/2016   Procedure: THYROPLASTY;  Surgeon: Beverly Gust, MD;  Location: [facility] ORS;  Service: ENT;  Laterality: Left;    There were no vitals filed for this visit.      Subjective Assessment - 05/18/16 1016    Subjective Patient reports practicing the exercises given at the time of his evaluation   Currently in Pain? No/denies               ADULT SLP TREATMENT - 05/18/16 0001      General Information   Behavior/Cognition Alert;Cooperative;Pleasant mood     Treatment Provided   Treatment provided Cognitive-Linquistic     Pain Assessment   Pain Assessment No/denies pain     Cognitive-Linquistic Treatment   Treatment  focused on Voice   Skilled Treatment The patient was provided with written and verbal teaching for supplement vocal tract relaxation exercises (tongue trills).  The patient achieves true voice at elevated pitch with tongue trill and is able to alter pitch (limited).  Patient was provided with written and verbal teaching regarding breath support exercises.  The patient demonstrates improving strength of abdominal breathing and breath support.  Patient instructed in relaxed phonation / oral resonance.  Patient able to achieve more relaxed phonation with hum and hum+vowel production.     Assessment / Recommendations / Plan   Plan Continue with current plan of care     Progression Toward Goals   Progression toward goals Progressing toward goals          SLP Education - 05/18/16 1016    Education provided Yes   Education Details Voice production   Person(s) Educated Patient   Methods Explanation   Comprehension Verbalized understanding            SLP Long Term Goals - 05/03/16 1320      SLP LONG TERM GOAL #1   Title The patient will demonstrate independent understanding of vocal hygiene concepts and neck, shoulder, lingual stretching exercises.   Time 8   Period Weeks   Status  New     SLP LONG TERM GOAL #2   Title The patient will be independent for abdominal breathing and breath support exercises.   Time 8   Period Weeks   Status New     SLP LONG TERM GOAL #3   Title The patient will minimize vocal tension via Yawn-Sigh approach (or comparable technique) with min SLP cues with 80% accuracy.   Time 8   Period Weeks   Status New     SLP LONG TERM GOAL #4   Title The patient will maintain relaxed phonation / oral resonance for paragraph length recitation with 80% accuracy.   Time 8   Period Weeks   Status New          Plan - 05/18/16 1017    Clinical Impression Statement Patient able to improve vocal quality with nasality to improve oral resonance but inconsistent  loudness to decrease laryngeal strain.   Speech Therapy Frequency 2x / week   Duration Other (comment)   Treatment/Interventions Patient/family education;SLP instruction and feedback;Other (comment)  Voice therapy   Potential to Achieve Goals Good   Potential Considerations Ability to learn/carryover information;Co-morbidities;Cooperation/participation level;Previous level of function;Severity of impairments;Family/community support   SLP Home Exercise Plan Breath support exercises, trills, oral resoance / relaxed phonation   Consulted and Agree with Plan of Care Patient      Patient will benefit from skilled therapeutic intervention in order to improve the following deficits and impairments:   Dysphonia    Problem List There are no active problems to display for this patient.  [name], MS/CCC- SLP  Lou Miner 05/18/2016, 10:18 AM  [place] MAIN [facility] SERVICES [address] [place], [place], [address] Phone: [phone]   Fax:  [phone]   Name: Jack Harris MRN: [id number] Date of Birth: 09-02-1933

## 2016-05-25 ENCOUNTER — Ambulatory Visit: Payer: 59 | Admitting: Speech Pathology

## 2016-05-25 ENCOUNTER — Encounter: Payer: Self-pay | Admitting: Speech Pathology

## 2016-05-25 DIAGNOSIS — R490 Dysphonia: Secondary | ICD-10-CM

## 2016-05-25 NOTE — Therapy (Signed)
[place] MAIN [facility] SERVICES [address] [place], [place], [address] Phone: [phone]   Fax:  [phone]  Speech Language Pathology Treatment  Patient Details  Name: Jack Harris MRN: [id number] Date of Birth: 08-11-1934 Referring Provider: Dr. Tami Ribas  Encounter Date: 05/25/2016      End of Session - 05/25/16 1002    Visit Number 3   Number of Visits 17   Date for SLP Re-Evaluation 07/02/16   SLP Start Time 0900   SLP Stop Time  0947   SLP Time Calculation (min) 47 min   Activity Tolerance Patient tolerated treatment well      Past Medical History:  Diagnosis Date  . AICD (automatic cardioverter/defibrillator) present    MEDTRONIC  . Aortic insufficiency   . Asthma    childhood asthma  . Atrial fibrillation ([place])   . Cancer ([place])    bladder, 10 yrs ago  . Cancer (HCC)    squamous cell carcinoma  . Cardiomyopathy ([place])   . CHF (congestive heart failure) ([place])   . Hyperlipidemia   . Hypothyroidism   . Paralysis of vocal cords and larynx, unilateral   . Shortness of breath dyspnea     Past Surgical History:  Procedure Laterality Date  . BLADDER SURGERY     Surgeon:Dr. Bernardo Heater, Location [place]   . [place]  . EYE SURGERY Bilateral    Cataract Extraction with IOL  . THYROPLASTY Left 03/02/2016   Procedure: THYROPLASTY;  Surgeon: Beverly Gust, MD;  Location: [facility] ORS;  Service: ENT;  Laterality: Left;    There were no vitals filed for this visit.      Subjective Assessment - 05/25/16 1001    Subjective The patient reports that he and his family have noted that his voice is stronger.   Currently in Pain? No/denies               ADULT SLP TREATMENT - 05/25/16 0001      General Information   Behavior/Cognition Alert;Cooperative;Pleasant mood     Treatment Provided   Treatment provided Cognitive-Linquistic     Pain Assessment   Pain Assessment  No/denies pain     Cognitive-Linquistic Treatment   Treatment focused on Voice   Skilled Treatment The patient was provided with written and verbal teaching for supplement vocal tract relaxation exercises (tongue trills).  The patient achieves true voice at elevated pitch with tongue trill and demonstrates improved pitch control with the trill.  Patient was provided with written and verbal teaching regarding breath support exercises.  Added loudness practice using voiceless fricatives (s,f,th).  The patient demonstrates improving strength of abdominal breathing and breath support.  Patient instructed in relaxed phonation / oral resonance.  Patient able to achieve more relaxed phonation with hum and f-initial words.     Assessment / Recommendations / Plan   Plan Continue with current plan of care     Progression Toward Goals   Progression toward goals Progressing toward goals          SLP Education - 05/25/16 1001    Education provided Yes   Education Details Loudness via breath support   Person(s) Educated Patient   Methods Explanation   Comprehension Verbalized understanding            SLP Long Term Goals - 05/03/16 1320      SLP LONG TERM GOAL #1   Title The patient will demonstrate independent  understanding of vocal hygiene concepts and neck, shoulder, lingual stretching exercises.   Time 8   Period Weeks   Status New     SLP LONG TERM GOAL #2   Title The patient will be independent for abdominal breathing and breath support exercises.   Time 8   Period Weeks   Status New     SLP LONG TERM GOAL #3   Title The patient will minimize vocal tension via Yawn-Sigh approach (or comparable technique) with min SLP cues with 80% accuracy.   Time 8   Period Weeks   Status New     SLP LONG TERM GOAL #4   Title The patient will maintain relaxed phonation / oral resonance for paragraph length recitation with 80% accuracy.   Time 8   Period Weeks   Status New           Plan - 05/25/16 1002    Clinical Impression Statement Patient able to improve vocal quality with nasality and increased pitch with some carry over into words.  Patient has inconsistent control of loudness via increased breath support.   Speech Therapy Frequency 2x / week   Duration Other (comment)   Treatment/Interventions Patient/family education;SLP instruction and feedback;Other (comment)  Voice therapy   Potential to Achieve Goals Good   Potential Considerations Ability to learn/carryover information;Co-morbidities;Cooperation/participation level;Previous level of function;Severity of impairments;Family/community support   SLP Home Exercise Plan Breath support exercises, trills, oral resonance / relaxed phonation, loudness exercises   Consulted and Agree with Plan of Care Patient      Patient will benefit from skilled therapeutic intervention in order to improve the following deficits and impairments:   Dysphonia    Problem List There are no active problems to display for this patient.  [name], MS/CCC- SLP  Lou Miner 05/25/2016, 10:04 AM  [place] MAIN [facility] SERVICES [address] [place], [place], [address] Phone: [phone]   Fax:  [phone]   Name: MEKI SUSTAITA MRN: [id number] Date of Birth: [birth year]

## 2016-05-29 ENCOUNTER — Encounter: Payer: Self-pay | Admitting: Speech Pathology

## 2016-05-29 ENCOUNTER — Ambulatory Visit: Payer: 59 | Admitting: Speech Pathology

## 2016-05-29 DIAGNOSIS — R490 Dysphonia: Secondary | ICD-10-CM | POA: Diagnosis not present

## 2016-05-29 NOTE — Therapy (Signed)
[place] MAIN [facility] SERVICES [address] [place], [place], [address] Phone: [phone]   Fax:  [phone]  Speech Language Pathology Treatment  Patient Details  Name: Jack Harris MRN: [id number] Date of Birth: 01-Aug-1934 Referring Provider: Dr. Tami Ribas  Encounter Date: 05/29/2016      End of Session - 05/29/16 1249    Visit Number 4   Number of Visits 17   Date for SLP Re-Evaluation 07/02/16   SLP Start Time 1000   SLP Stop Time  1055   SLP Time Calculation (min) 55 min   Activity Tolerance Patient tolerated treatment well      Past Medical History:  Diagnosis Date  . AICD (automatic cardioverter/defibrillator) present    MEDTRONIC  . Aortic insufficiency   . Asthma    childhood asthma  . Atrial fibrillation ([place])   . Cancer ([place])    bladder, 10 yrs ago  . Cancer (HCC)    squamous cell carcinoma  . Cardiomyopathy ([place])   . CHF (congestive heart failure) ([place])   . Hyperlipidemia   . Hypothyroidism   . Paralysis of vocal cords and larynx, unilateral   . Shortness of breath dyspnea     Past Surgical History:  Procedure Laterality Date  . BLADDER SURGERY     Surgeon:Dr. Bernardo Heater, Location [place]   . [place]  . EYE SURGERY Bilateral    Cataract Extraction with IOL  . THYROPLASTY Left 03/02/2016   Procedure: THYROPLASTY;  Surgeon: Beverly Gust, MD;  Location: [facility] ORS;  Service: ENT;  Laterality: Left;    There were no vitals filed for this visit.      Subjective Assessment - 05/29/16 1248    Subjective The patient reports that others have commented on the improved quality/audibility of his voice.    Currently in Pain? No/denies               ADULT SLP TREATMENT - 05/29/16 0001      General Information   Behavior/Cognition Alert;Cooperative;Pleasant mood     Treatment Provided   Treatment provided Cognitive-Linquistic     Pain Assessment   Pain  Assessment No/denies pain     Cognitive-Linquistic Treatment   Treatment focused on Voice   Skilled Treatment The patient education regarding breath support was reviewed and demonstrated. Patient demonstrated independence in breath support exercises. Average maximum /s/ production time was 16 sec. Average trill duration was 6 sec. Patient required models and feedback during loudness practice with voiceless fricative (/s/, /f/, th); the cue "tongue out" was helpful for "th." With a high pitched voice, the patient was able to produce words and phrases with improved vocal quality at an average of 70 dB. The patient demonstrated improved ability to maintain good voicing while altering pitch during hum. After lowering target pitch in humming, the patient was able to maintain this pitch in phrases while maintaining improved vocal quality. Moderate cues and models to maintain good breath support were required during reading aloud of phrases. Patient education was provided regarding throat clearing and cough distracting techniques. At this time, the patient demonstrates emerging awareness of his own frequent throat-clearing and benefits from clinician drawing his attention to his throat clearing so that he can choose an alternative behavior.        Assessment / Recommendations / Plan   Plan Continue with current plan of care     Progression Toward Goals   Progression toward  goals Progressing toward goals          SLP Education - 05/29/16 1249    Education provided Yes   Education Details Breath support, cough distraction   Person(s) Educated Patient   Methods Explanation;Demonstration   Comprehension Verbalized understanding;Returned demonstration            SLP Long Term Goals - 05/03/16 1320      SLP LONG TERM GOAL #1   Title The patient will demonstrate independent understanding of vocal hygiene concepts and neck, shoulder, lingual stretching exercises.   Time 8   Period Weeks   Status  New     SLP LONG TERM GOAL #2   Title The patient will be independent for abdominal breathing and breath support exercises.   Time 8   Period Weeks   Status New     SLP LONG TERM GOAL #3   Title The patient will minimize vocal tension via Yawn-Sigh approach (or comparable technique) with min SLP cues with 80% accuracy.   Time 8   Period Weeks   Status New     SLP LONG TERM GOAL #4   Title The patient will maintain relaxed phonation / oral resonance for paragraph length recitation with 80% accuracy.   Time 8   Period Weeks   Status New          Plan - 05/29/16 1249    Clinical Impression Statement Patient able to improve vocal quality with improved breath support and warm-up humming/trilling to adjust pitch. Patient demonstrates growing awareness of breath support and vocal quality and emerging awareness of throat clearing behavior.    Speech Therapy Frequency 2x / week   Duration Other (comment)   Treatment/Interventions Patient/family education;SLP instruction and feedback;Other (comment)   Potential to Achieve Goals Good   SLP Home Exercise Plan Breath support exercises, trills, oral resonance / relaxed phonation, loudness exercises, cough distraction   Consulted and Agree with Plan of Care Patient      Patient will benefit from skilled therapeutic intervention in order to improve the following deficits and impairments:   Dysphonia    Problem List There are no active problems to display for this patient.   Rickard Rhymes  Graduate Student Clinician 05/29/2016, 12:53 PM  [place] MAIN [facility] SERVICES [address] [place], [place], [address] Phone: [phone]   Fax:  [phone]   Name: Jack Harris MRN: [id number] Date of Birth: [birth year]

## 2016-05-31 ENCOUNTER — Ambulatory Visit: Payer: 59 | Admitting: Speech Pathology

## 2016-06-05 ENCOUNTER — Ambulatory Visit: Payer: 59 | Admitting: Speech Pathology

## 2016-06-05 ENCOUNTER — Encounter: Payer: Self-pay | Admitting: Speech Pathology

## 2016-06-05 DIAGNOSIS — R490 Dysphonia: Secondary | ICD-10-CM | POA: Diagnosis not present

## 2016-06-05 NOTE — Therapy (Signed)
[place] MAIN [facility] SERVICES [address] [place], [place], [address] Phone: [phone]   Fax:  [phone]  Speech Language Pathology Treatment  Patient Details  Name: Jack Harris MRN: [id number] Date of Birth: 1934/05/09 Referring Provider: Dr. Tami Ribas  Encounter Date: 06/05/2016      End of Session - 06/05/16 1234    Visit Number 5   Number of Visits 17   Date for SLP Re-Evaluation 07/02/16   SLP Start Time 11   SLP Stop Time  1200   SLP Time Calculation (min) 60 min   Activity Tolerance Patient tolerated treatment well      Past Medical History:  Diagnosis Date  . AICD (automatic cardioverter/defibrillator) present    MEDTRONIC  . Aortic insufficiency   . Asthma    childhood asthma  . Atrial fibrillation ([place])   . Cancer ([place])    bladder, 10 yrs ago  . Cancer (HCC)    squamous cell carcinoma  . Cardiomyopathy ([place])   . CHF (congestive heart failure) ([place])   . Hyperlipidemia   . Hypothyroidism   . Paralysis of vocal cords and larynx, unilateral   . Shortness of breath dyspnea     Past Surgical History:  Procedure Laterality Date  . BLADDER SURGERY     Surgeon:Dr. Bernardo Heater, Location [place]   . [place]  . EYE SURGERY Bilateral    Cataract Extraction with IOL  . THYROPLASTY Left 03/02/2016   Procedure: THYROPLASTY;  Surgeon: Beverly Gust, MD;  Location: [facility] ORS;  Service: ENT;  Laterality: Left;    There were no vitals filed for this visit.      Subjective Assessment - 06/05/16 1232    Subjective The patient reports that he did not practice breath support exercises over the weekend.    Currently in Pain? No/denies               ADULT SLP TREATMENT - 06/05/16 0001      General Information   Behavior/Cognition Alert;Cooperative;Pleasant mood     Treatment Provided   Treatment provided Cognitive-Linquistic     Pain Assessment   Pain  Assessment No/denies pain     Cognitive-Linquistic Treatment   Treatment focused on Voice   Skilled Treatment The patient education regarding breath support was reviewed and demonstrated. Patient requested some support in models/feedback during breath support exercises. Patient required models during loudness practice with voiceless fricative (/s/, /f/, th). With a high pitched voice, the patient was able to produce words and phrases with improved vocal quality at an average of 70 dB. The patient demonstrated ability to maintain good voicing while altering pitch during hum. After lowering target pitch in humming, the patient was able to maintain this pitch in phrases while maintaining improved vocal quality. During two-syllable word and short phrase intonation exercises, the patient was consistently able to transition pitch from A3/B3 (~220-245Hz ) to G3 (~195 Hz) while maintaining good vocal quality and intensity of 70 dB. The patient self-identified and corrected errors with min-mod need for models and cues to help him correct errors. When vocal quality deteriorated, improving breath support and practicing pitch targets while humming helped regain target vocal quality. Moderate cues to maintain good breath support were required during reading aloud of phrases. The patient demonstrated less throat clearing than in previous session and succeeded in independently implementing cough distraction techniques on several occasions. Moderate cues still required to alert him to throat-clearing  behavior.      Assessment / Recommendations / Plan   Plan Continue with current plan of care     Progression Toward Goals   Progression toward goals Progressing toward goals          SLP Education - 06/05/16 1233    Education provided Yes   Education Details breath support, intonation exercises   Person(s) Educated Patient   Methods Explanation;Demonstration   Comprehension Verbalized understanding;Returned  demonstration            SLP Long Term Goals - 05/03/16 1320      SLP LONG TERM GOAL #1   Title The patient will demonstrate independent understanding of vocal hygiene concepts and neck, shoulder, lingual stretching exercises.   Time 8   Period Weeks   Status New     SLP LONG TERM GOAL #2   Title The patient will be independent for abdominal breathing and breath support exercises.   Time 8   Period Weeks   Status New     SLP LONG TERM GOAL #3   Title The patient will minimize vocal tension via Yawn-Sigh approach (or comparable technique) with min SLP cues with 80% accuracy.   Time 8   Period Weeks   Status New     SLP LONG TERM GOAL #4   Title The patient will maintain relaxed phonation / oral resonance for paragraph length recitation with 80% accuracy.   Time 8   Period Weeks   Status New          Plan - 06/05/16 1235    Clinical Impression Statement Patient able to improve vocal quality with improved breath support and warm-up humming/trilling to adjust pitch. Patient demonstrates growing awareness of breath support and vocal quality and emerging ability to monitor & correct throat clearing behavior.    Speech Therapy Frequency 2x / week   Duration Other (comment)  8 weeks   Treatment/Interventions Patient/family education;SLP instruction and feedback;Other (comment)   Potential to Achieve Goals Good   Potential Considerations Ability to learn/carryover information;Co-morbidities;Cooperation/participation level;Previous level of function;Severity of impairments;Family/community support   SLP Home Exercise Plan breath support exercises, trills, intonation exercises, oral resonance / relaxed phonation, loudness exercises, cough distraction   Consulted and Agree with Plan of Care Patient      Patient will benefit from skilled therapeutic intervention in order to improve the following deficits and impairments:   Dysphonia    Problem List There are no active  problems to display for this patient.   Rickard Rhymes  Graduate Student Clinician 06/05/2016, 12:37 PM  [place] MAIN [facility] SERVICES [address] [place], [place], [address] Phone: [phone]   Fax:  [phone]   Name: Jack Harris MRN: [id number] Date of Birth: [birth year]

## 2016-06-06 ENCOUNTER — Encounter: Payer: 59 | Admitting: Speech Pathology

## 2016-06-09 ENCOUNTER — Encounter: Payer: Self-pay | Admitting: Speech Pathology

## 2016-06-09 ENCOUNTER — Ambulatory Visit: Payer: 59 | Admitting: Speech Pathology

## 2016-06-09 DIAGNOSIS — R490 Dysphonia: Secondary | ICD-10-CM

## 2016-06-09 NOTE — Therapy (Signed)
[place] MAIN [facility] SERVICES [address] [place], [place], [address] Phone: [phone]   Fax:  [phone]  Speech Language Pathology Treatment  Patient Details  Name: Jack Harris MRN: [id number] Date of Birth: 1934/03/26 Referring Provider: Dr. Tami Ribas  Encounter Date: 06/09/2016      End of Session - 06/09/16 1303    Visit Number 6   Number of Visits 17   Date for SLP Re-Evaluation 07/02/16   SLP Start Time 0900   SLP Stop Time  1000   SLP Time Calculation (min) 60 min   Activity Tolerance Patient tolerated treatment well      Past Medical History:  Diagnosis Date  . AICD (automatic cardioverter/defibrillator) present    MEDTRONIC  . Aortic insufficiency   . Asthma    childhood asthma  . Atrial fibrillation ([place])   . Cancer ([place])    bladder, 10 yrs ago  . Cancer (HCC)    squamous cell carcinoma  . Cardiomyopathy ([place])   . CHF (congestive heart failure) ([place])   . Hyperlipidemia   . Hypothyroidism   . Paralysis of vocal cords and larynx, unilateral   . Shortness of breath dyspnea     Past Surgical History:  Procedure Laterality Date  . BLADDER SURGERY     Surgeon:Dr. Bernardo Heater, Location [place]   . [place]  . EYE SURGERY Bilateral    Cataract Extraction with IOL  . THYROPLASTY Left 03/02/2016   Procedure: THYROPLASTY;  Surgeon: Beverly Gust, MD;  Location: [facility] ORS;  Service: ENT;  Laterality: Left;    There were no vitals filed for this visit.      Subjective Assessment - 06/09/16 1302    Subjective The patient reports that he did not practice breath support exercises at home   Currently in Pain? No/denies               ADULT SLP TREATMENT - 06/09/16 0001      General Information   Behavior/Cognition Alert;Cooperative;Pleasant mood     Treatment Provided   Treatment provided Cognitive-Linquistic     Pain Assessment   Pain Assessment No/denies  pain     Cognitive-Linquistic Treatment   Treatment focused on Voice   Skilled Treatment Instruction, modeling, and verbal and tactile feedback provided regarding diaphragmatic breathing. Patient demonstrated improved awareness of correct/incorrect behaviors for breath support by end of session and began to self-correct. Education provided regarding neck tension and retroflection contributing to poor vocal quality; clinician provided verbal cues when patient engaged in these behaviors. When humming descending pitch glides, the patient succeeded in maintaining good vocal quality to 87 Hz. The patient was able to provide nasal words with good vocal quality at around 100 Hz & 58 dB and easy onset words at approximately 145 Hz, 68 dB. The patient was instructed and practiced using descending pitch glide hum and deep breathing exercises to regain better vocal quality when speech became gravelly or resumed high pitch.     Assessment / Recommendations / Plan   Plan Continue with current plan of care     Progression Toward Goals   Progression toward goals Progressing toward goals          SLP Education - 06/09/16 1302    Education provided Yes   Education Details breath support and tension reduction   Person(s) Educated Patient   Methods Explanation   Comprehension Verbalized understanding  SLP Long Term Goals - 05/03/16 1320      SLP LONG TERM GOAL #1   Title The patient will demonstrate independent understanding of vocal hygiene concepts and neck, shoulder, lingual stretching exercises.   Time 8   Period Weeks   Status New     SLP LONG TERM GOAL #2   Title The patient will be independent for abdominal breathing and breath support exercises.   Time 8   Period Weeks   Status New     SLP LONG TERM GOAL #3   Title The patient will minimize vocal tension via Yawn-Sigh approach (or comparable technique) with min SLP cues with 80% accuracy.   Time 8   Period Weeks   Status  New     SLP LONG TERM GOAL #4   Title The patient will maintain relaxed phonation / oral resonance for paragraph length recitation with 80% accuracy.   Time 8   Period Weeks   Status New          Plan - 06/09/16 1303    Clinical Impression Statement The patient demonstrated marked improvements in control and awareness of breath support during treatment session. He expressed that he was pleased with his ability to produce a lower pitch, good quality voice during the session today. He also demonstrated better awareness of throat clearing behavior.    Speech Therapy Frequency 2x / week   Duration Other (comment)  8 weeks   Treatment/Interventions Patient/family education;SLP instruction and feedback;Other (comment)   Potential to Achieve Goals Good   Potential Considerations Ability to learn/carryover information;Co-morbidities;Cooperation/participation level;Previous level of function;Severity of impairments;Family/community support   SLP Home Exercise Plan Write 10 short, functional phrases for use in therapy. Practice breath support, relaxation, and descending pitch glides with humming.    Consulted and Agree with Plan of Care Patient      Patient will benefit from skilled therapeutic intervention in order to improve the following deficits and impairments:   Dysphonia    Problem List There are no active problems to display for this patient.   Rickard Rhymes  Graduate Student Clinician 06/09/2016, 1:06 PM  [place] MAIN [facility] SERVICES [address] [place], [place], [address] Phone: [phone]   Fax:  [phone]   Name: Jack Harris MRN: [id number] Date of Birth: [birth year]

## 2016-06-12 ENCOUNTER — Ambulatory Visit: Payer: 59 | Admitting: Speech Pathology

## 2016-06-12 ENCOUNTER — Encounter: Payer: Self-pay | Admitting: Speech Pathology

## 2016-06-12 DIAGNOSIS — R490 Dysphonia: Secondary | ICD-10-CM | POA: Diagnosis not present

## 2016-06-12 NOTE — Therapy (Signed)
[place] MAIN [facility] SERVICES [address] [place], [place], [address] Phone: [phone]   Fax:  [phone]  Speech Language Pathology Treatment  Patient Details  Name: ANTHEM BUTIKOFER MRN: [id number] Date of Birth: 02-Sep-1933 Referring Provider: Dr. Tami Ribas  Encounter Date: 06/12/2016      End of Session - 06/12/16 1215    Visit Number 7   Number of Visits 17   Date for SLP Re-Evaluation 07/02/16   SLP Start Time 72   SLP Stop Time  1155   SLP Time Calculation (min) 55 min   Activity Tolerance Patient tolerated treatment well      Past Medical History:  Diagnosis Date  . AICD (automatic cardioverter/defibrillator) present    MEDTRONIC  . Aortic insufficiency   . Asthma    childhood asthma  . Atrial fibrillation ([place])   . Cancer ([place])    bladder, 10 yrs ago  . Cancer (HCC)    squamous cell carcinoma  . Cardiomyopathy ([place])   . CHF (congestive heart failure) ([place])   . Hyperlipidemia   . Hypothyroidism   . Paralysis of vocal cords and larynx, unilateral   . Shortness of breath dyspnea     Past Surgical History:  Procedure Laterality Date  . BLADDER SURGERY     Surgeon:Dr. Bernardo Heater, Location [place]   . [place]  . EYE SURGERY Bilateral    Cataract Extraction with IOL  . THYROPLASTY Left 03/02/2016   Procedure: THYROPLASTY;  Surgeon: Beverly Gust, MD;  Location: [facility];  Service: ENT;  Laterality: Left;    There were no vitals filed for this visit.      Subjective Assessment - 06/12/16 1214    Subjective The patient reports practicing breath support exercises in car, shower, and lying down. He completed list of 10 functional phrases. During greeting and conversation, his pitch was notably lower than in previous weeks.    Currently in Pain? No/denies               ADULT SLP TREATMENT - 06/12/16 0001      General Information   Behavior/Cognition  Alert;Cooperative;Pleasant mood     Treatment Provided   Treatment provided Cognitive-Linquistic     Pain Assessment   Pain Assessment No/denies pain     Cognitive-Linquistic Treatment   Treatment focused on Voice   Skilled Treatment Patient completed breath support exercises without retroflexing, arching, elevating shoulders, or demonstrating neck or jaw tension independently with only 2-3 verbal cues - the most independence in this exercise to date. Patient then performed decreasing pitch glides with humming with min cues. Lowest pitch attained using glide was 98 Hz. Patient was able to produce /m/ plus vowel ("ma," "may," "me," etc.) with good vocal quality at approximately 120 Hz. Patient used hum as needed to lead into one-word spontaneous responses and two-word functional phrases with good vocal quality and targeting lower pitch. Intensity of two-word phrases was 67-70 dB. Patient increased intensity using good breath support and relaxed neck and jaw musculature while maintained constant pitch with moderate cues and models - maximum intensity achieved 73 dB.  Throughout session, patient demonstrated awareness of throat clearing and implemented distraction strategies.      Assessment / Recommendations / Plan   Plan Continue with current plan of care     Progression Toward Goals   Progression toward goals Progressing toward goals  SLP Education - 06/12/16 1215    Education provided Yes   Education Details breath support, tension reduction   Person(s) Educated Patient   Methods Explanation   Comprehension Verbalized understanding            SLP Long Term Goals - 05/03/16 1320      SLP LONG TERM GOAL #1   Title The patient will demonstrate independent understanding of vocal hygiene concepts and neck, shoulder, lingual stretching exercises.   Time 8   Period Weeks   Status New     SLP LONG TERM GOAL #2   Title The patient will be independent for abdominal  breathing and breath support exercises.   Time 8   Period Weeks   Status New     SLP LONG TERM GOAL #3   Title The patient will minimize vocal tension via Yawn-Sigh approach (or comparable technique) with min SLP cues with 80% accuracy.   Time 8   Period Weeks   Status New     SLP LONG TERM GOAL #4   Title The patient will maintain relaxed phonation / oral resonance for paragraph length recitation with 80% accuracy.   Time 8   Period Weeks   Status New          Plan - 06/12/16 1215    Clinical Impression Statement The patient demonstrated increased independence and awareness in implementation of breath support. Throat clearing was reduced and patient demonstrated monitoring & distraction techniques. The patient is able to easily achieve a pitch that was the low end of his range a week ago; his range has increased considerably. Requires humming, breathing for relaxation, and cues to consistently achieve target lower pitches with good vocal quality and without tension.   Speech Therapy Frequency 2x / week   Duration Other (comment)  8 weeks   Treatment/Interventions Patient/family education;SLP instruction and feedback;Other (comment)   Potential to Achieve Goals Good   Potential Considerations Ability to learn/carryover information;Co-morbidities;Cooperation/participation level;Previous level of function;Severity of impairments;Family/community support   SLP Home Exercise Plan practice breath support, relaxation, descending pitch glides with humming, increasing loudness with humming, 2 shortest functional phrases   Consulted and Agree with Plan of Care Patient      Patient will benefit from skilled therapeutic intervention in order to improve the following deficits and impairments:   Dysphonia    Problem List There are no active problems to display for this patient.   Rickard Rhymes  Graduate Student Clinician 06/12/2016, 12:20 PM  [place] MAIN [facility] SERVICES [address] [place], [place], [address] Phone: [phone]   Fax:  [phone]   Name: SION GINSBURG MRN: [id number] Date of Birth: [birth year]

## 2016-06-15 ENCOUNTER — Ambulatory Visit: Payer: 59 | Attending: Unknown Physician Specialty | Admitting: Speech Pathology

## 2016-06-15 DIAGNOSIS — R490 Dysphonia: Secondary | ICD-10-CM | POA: Insufficient documentation

## 2016-06-16 ENCOUNTER — Encounter: Payer: Self-pay | Admitting: Speech Pathology

## 2016-06-16 NOTE — Therapy (Signed)
[place] MAIN [facility] SERVICES [address] [place], [place], [address] Phone: [phone]   Fax:  [phone]  Speech Language Pathology Treatment  Patient Details  Name: Jack Harris MRN: [id number] Date of Birth: Jan 30, 1934 Referring Provider: Dr. Tami Ribas  Encounter Date: 06/15/2016      End of Session - 06/16/16 0912    Visit Number 8   Number of Visits 17   Date for SLP Re-Evaluation 07/02/16   SLP Start Time 74   SLP Stop Time  1200   SLP Time Calculation (min) 60 min   Activity Tolerance Patient tolerated treatment well      Past Medical History:  Diagnosis Date  . AICD (automatic cardioverter/defibrillator) present    MEDTRONIC  . Aortic insufficiency   . Asthma    childhood asthma  . Atrial fibrillation ([place])   . Cancer ([place])    bladder, 10 yrs ago  . Cancer (HCC)    squamous cell carcinoma  . Cardiomyopathy ([place])   . CHF (congestive heart failure) ([place])   . Hyperlipidemia   . Hypothyroidism   . Paralysis of vocal cords and larynx, unilateral   . Shortness of breath dyspnea     Past Surgical History:  Procedure Laterality Date  . BLADDER SURGERY     Surgeon:Dr. Bernardo Heater, Location [place]   . [place]  . EYE SURGERY Bilateral    Cataract Extraction with IOL  . THYROPLASTY Left 03/02/2016   Procedure: THYROPLASTY;  Surgeon: Beverly Gust, MD;  Location: [facility];  Service: ENT;  Laterality: Left;    There were no vitals filed for this visit.      Subjective Assessment - 06/16/16 0909    Subjective The patient reports that more people are telling his "I can hear you"   Currently in Pain? No/denies               ADULT SLP TREATMENT - 06/16/16 0001      General Information   Behavior/Cognition Alert;Cooperative;Pleasant mood     Treatment Provided   Treatment provided Cognitive-Linquistic     Pain Assessment   Pain Assessment No/denies pain      Cognitive-Linquistic Treatment   Treatment focused on Voice   Skilled Treatment The patient was provided with written and verbal teaching for supplement vocal tract relaxation exercises (tongue trills).  The patient achieves true voice at elevated pitch with tongue trill and demonstrates improved pitch control with the trill.  Patient was provided with written and verbal teaching regarding breath support exercises.  Continues with loudness practice using voiceless fricatives (s,f,th).  The patient demonstrates improving strength of abdominal breathing and breath support, as well as awareness of poor breathing patterns.  Patient instructed in relaxed phonation / oral resonance.  Patient given written and verbal teaching in "chant talk" technique to promote relaxed phonation / oral resonance.     Assessment / Recommendations / Plan   Plan Continue with current plan of care     Progression Toward Goals   Progression toward goals Progressing toward goals          SLP Education - 06/16/16 0910    Education provided Yes   Education Details Jenean [place] talk and techniques to lower pitch while practicing.  Reminded patient to spend frequent short periods of practice to reduce vocal fatigue   Person(s) Educated Patient   Methods Explanation   Comprehension Verbalized understanding  SLP Long Term Goals - 05/03/16 1320      SLP LONG TERM GOAL #1   Title The patient will demonstrate independent understanding of vocal hygiene concepts and neck, shoulder, lingual stretching exercises.   Time 8   Period Weeks   Status New     SLP LONG TERM GOAL #2   Title The patient will be independent for abdominal breathing and breath support exercises.   Time 8   Period Weeks   Status New     SLP LONG TERM GOAL #3   Title The patient will minimize vocal tension via Yawn-Sigh approach (or comparable technique) with min SLP cues with 80% accuracy.   Time 8   Period Weeks   Status New     SLP  LONG TERM GOAL #4   Title The patient will maintain relaxed phonation / oral resonance for paragraph length recitation with 80% accuracy.   Time 8   Period Weeks   Status New          Plan - 06/16/16 0912    Clinical Impression Statement Patient able to improve vocal quality with nasality and chanting.  The patient continues to have difficulty with maintaining the clarity when lowering pitch.       Speech Therapy Frequency 2x / week   Duration Other (comment)   Treatment/Interventions Patient/family education;SLP instruction and feedback;Other (comment)   Potential to Achieve Goals Good   Potential Considerations Ability to learn/carryover information;Co-morbidities;Cooperation/participation level;Previous level of function;Severity of impairments;Family/community support   SLP Home Exercise Plan practice breath support, relaxation, descending pitch glides with humming, increasing loudness with humming, 2 shortest functional phrases, chant talk   Consulted and Agree with Plan of Care Patient      Patient will benefit from skilled therapeutic intervention in order to improve the following deficits and impairments:   Dysphonia    Problem List There are no active problems to display for this patient.  [name], MS/CCC- SLP  Lou Miner 06/16/2016, 9:14 AM  [place] MAIN [facility] SERVICES [address] [place], [place], [address] Phone: [phone]   Fax:  [phone]   Name: Jack Harris MRN: [id number] Date of Birth: 02-01-1934

## 2016-06-19 ENCOUNTER — Ambulatory Visit: Payer: 59 | Admitting: Speech Pathology

## 2016-06-19 ENCOUNTER — Encounter: Payer: Self-pay | Admitting: Speech Pathology

## 2016-06-19 DIAGNOSIS — R490 Dysphonia: Secondary | ICD-10-CM | POA: Diagnosis not present

## 2016-06-19 NOTE — Therapy (Signed)
[place] MAIN [facility] SERVICES [address] [place], [place], [address] Phone: [phone]   Fax:  [phone]  Speech Language Pathology Treatment  Patient Details  Name: Jack Harris MRN: [id number] Date of Birth: 1933/12/19 Referring Provider: Dr. Tami Ribas  Encounter Date: 06/19/2016      End of Session - 06/19/16 1507    Visit Number 9   Number of Visits 17   Date for SLP Re-Evaluation 07/02/16   SLP Start Time 1055   SLP Stop Time  1155   SLP Time Calculation (min) 60 min   Activity Tolerance Patient tolerated treatment well      Past Medical History:  Diagnosis Date  . AICD (automatic cardioverter/defibrillator) present    MEDTRONIC  . Aortic insufficiency   . Asthma    childhood asthma  . Atrial fibrillation ([place])   . Cancer ([place])    bladder, 10 yrs ago  . Cancer (HCC)    squamous cell carcinoma  . Cardiomyopathy ([place])   . CHF (congestive heart failure) ([place])   . Hyperlipidemia   . Hypothyroidism   . Paralysis of vocal cords and larynx, unilateral   . Shortness of breath dyspnea     Past Surgical History:  Procedure Laterality Date  . BLADDER SURGERY     Surgeon:Dr. Bernardo Heater, Location [place]   . [place]  . EYE SURGERY Bilateral    Cataract Extraction with IOL  . THYROPLASTY Left 03/02/2016   Procedure: THYROPLASTY;  Surgeon: Beverly Gust, MD;  Location: [facility] ORS;  Service: ENT;  Laterality: Left;    There were no vitals filed for this visit.      Subjective Assessment - 06/19/16 1506    Subjective The clinician commented on patient's improved intensity / audibility at the beginning of the session. The patient reported that friends had also commented on his improved audibility, both in person and on the phone. He also reported being able to sing at a lower frequency that "sounded good" at church.    Currently in Pain? No/denies               ADULT SLP  TREATMENT - 06/19/16 0001      General Information   Behavior/Cognition Alert;Cooperative;Pleasant mood     Treatment Provided   Treatment provided Cognitive-Linquistic     Pain Assessment   Pain Assessment No/denies pain     Cognitive-Linquistic Treatment   Treatment focused on Voice   Skilled Treatment The patient completed diaphragmatic breathing exercises with minimal cues. Initial intensity in spontaneous conversation 56-63 dB. In hummed, descending pitch glides, frequency ranged from 200 Hz to 100 Hz with good vocal quality; intensity ranged from 76 dB at highest frequency to 65 dB at lowest frequency. Instruction provided in neck stretches; clinician completed stretches with clinician model. Patient completed chant talk at average 185 Hz; inconsistently achieved intensity up to 76-77 dB with good vocal quality. Patient produced functional phrases with decreasing intonation, highest intensity achieved with good vocal quality 72 dB. The patient then produced short spontaneous responses - frequent cues required to maintain good vocal quality. The patient also received instruction in and practiced straw phonation exercises targeting simultaneous control of breath and phonation - able to complete straw air blowing in water with and without phonation.      Assessment / Recommendations / Plan   Plan Continue with current plan of care     Progression Toward Goals  Progression toward goals Progressing toward goals          SLP Education - 06/19/16 1506    Education provided Yes   Education Details Jenean [place] talk, techniques to lower pitch while practicing, humming before speaking to "warm up" good vocal quality   Person(s) Educated Patient   Methods Explanation   Comprehension Verbalized understanding            SLP Long Term Goals - 05/03/16 1320      SLP LONG TERM GOAL #1   Title The patient will demonstrate independent understanding of vocal hygiene concepts and neck, shoulder,  lingual stretching exercises.   Time 8   Period Weeks   Status New     SLP LONG TERM GOAL #2   Title The patient will be independent for abdominal breathing and breath support exercises.   Time 8   Period Weeks   Status New     SLP LONG TERM GOAL #3   Title The patient will minimize vocal tension via Yawn-Sigh approach (or comparable technique) with min SLP cues with 80% accuracy.   Time 8   Period Weeks   Status New     SLP LONG TERM GOAL #4   Title The patient will maintain relaxed phonation / oral resonance for paragraph length recitation with 80% accuracy.   Time 8   Period Weeks   Status New          Plan - 06/19/16 1508    Clinical Impression Statement Patient gaining independence in diaphragmatic breathing in structured activities. Patient able to speak with improved vocal quality and intensity in short, structured activities. Requires reminders to use strategies for better vocal quality during spontaneous speech. Continues to have difficulty maintaining good vocal quality and adequate intensity at lower pitches in speech.    Speech Therapy Frequency 2x / week   Duration Other (comment)  8 weeks   Treatment/Interventions Patient/family education;SLP instruction and feedback;Other (comment)   Potential to Achieve Goals Good   Potential Considerations Ability to learn/carryover information;Co-morbidities;Cooperation/participation level;Previous level of function;Severity of impairments;Family/community support   SLP Home Exercise Plan practice breath support, relaxation, descending pitch glides with humming, increasing loudness with humming, functional phrases, chant talk, neck stretches, straw phonation   Consulted and Agree with Plan of Care Patient      Patient will benefit from skilled therapeutic intervention in order to improve the following deficits and impairments:   Dysphonia    Problem List There are no active problems to display for this  patient.   Rickard Rhymes  Graduate Student Clinician 06/19/2016, 3:14 PM  [place] MAIN [facility] SERVICES [address] [place], [place], [address] Phone: [phone]   Fax:  [phone]   Name: VITOLD LAPIETRA MRN: [id number] Date of Birth: [birth year]

## 2016-06-23 ENCOUNTER — Encounter: Payer: Self-pay | Admitting: Speech Pathology

## 2016-06-23 ENCOUNTER — Ambulatory Visit: Payer: 59 | Admitting: Speech Pathology

## 2016-06-23 DIAGNOSIS — R490 Dysphonia: Secondary | ICD-10-CM | POA: Diagnosis not present

## 2016-06-23 NOTE — Therapy (Signed)
[place] MAIN [facility] SERVICES [address] [place], [place], [address] Phone: [phone]   Fax:  [phone]  Speech Language Pathology Treatment  Patient Details  Name: Jack Harris MRN: [id number] Date of Birth: 11-26-1933 Referring Provider: Dr. Tami Ribas  Encounter Date: 06/23/2016      End of Session - 06/23/16 1555    Visit Number 10   Number of Visits 17   Date for SLP Re-Evaluation 07/02/16   SLP Start Time 1410   SLP Stop Time  1500   SLP Time Calculation (min) 50 min   Activity Tolerance Patient tolerated treatment well      Past Medical History:  Diagnosis Date  . AICD (automatic cardioverter/defibrillator) present    MEDTRONIC  . Aortic insufficiency   . Asthma    childhood asthma  . Atrial fibrillation ([place])   . Cancer ([place])    bladder, 10 yrs ago  . Cancer (HCC)    squamous cell carcinoma  . Cardiomyopathy ([place])   . CHF (congestive heart failure) ([place])   . Hyperlipidemia   . Hypothyroidism   . Paralysis of vocal cords and larynx, unilateral   . Shortness of breath dyspnea     Past Surgical History:  Procedure Laterality Date  . BLADDER SURGERY     Surgeon:Dr. Bernardo Heater, Location [place]   . [place]  . EYE SURGERY Bilateral    Cataract Extraction with IOL  . THYROPLASTY Left 03/02/2016   Procedure: THYROPLASTY;  Surgeon: Beverly Gust, MD;  Location: [facility] ORS;  Service: ENT;  Laterality: Left;    There were no vitals filed for this visit.      Subjective Assessment - 06/23/16 1554    Subjective The patient reports that friends have remarked on the continuous improvement in his voice. He reports that singing helps expand his lower pitch range.   Patient is accompained by: Family member   Currently in Pain? No/denies               ADULT SLP TREATMENT - 06/23/16 0001      General Information   Behavior/Cognition Alert;Cooperative;Pleasant mood      Treatment Provided   Treatment provided Cognitive-Linquistic     Pain Assessment   Pain Assessment No/denies pain     Cognitive-Linquistic Treatment   Treatment focused on Voice   Skilled Treatment The patient reports practicing breath support; descending, hummed pitch glides; and /m/-loaded phrases with chant-like talk. Instruction provided in straw phonation; the patient was encouraged to practice maintaining breath stream while phonating using straw at home - it is currently difficult for him to complete this task. The patient achieved intensity of 79 dB and good phonatory quality while humming at approximately 195 Hz. The patient executed hummed, descending pitch glides from approx. 220 Hz, 77 dB to 115 Hz, 55 dB with no cues. The patient read sentences aloud while implementing breath support, continuous phonation, and maintaining a lower pitch without compromising vocal quality. The patient demonstrated consistent, independent use of good breath support in this structured activity and self-monitored and self-corrected for vocal quality. The patient was consistently able to achieve an intensity of 68 dB at 175-195 Hz with fairly stable vocal quality; he was inconsistently able to maintain good vocal quality at 165-175 Hz and 67 dB. The patient demonstrated frequent throat-clearing behavior; although he was aware of the behavior after the fact, he was infrequently successful in implementing distraction techniques to  prevent it.       Assessment / Recommendations / Plan   Plan Continue with current plan of care     Progression Toward Goals   Progression toward goals Progressing toward goals          SLP Education - 06/23/16 1555    Education provided Yes   Education Details straw phonation   Person(s) Educated Patient   Methods Explanation;Demonstration   Comprehension Verbalized understanding;Returned demonstration            SLP Long Term Goals - 06/23/16 1558      SLP  LONG TERM GOAL #1   Title The patient will demonstrate independent understanding of vocal hygiene concepts and neck, shoulder, lingual stretching exercises.   Time 8   Period Weeks   Status Achieved     SLP LONG TERM GOAL #2   Title The patient will be independent for abdominal breathing and breath support exercises.   Time 8   Period Weeks   Status Partially Met  Met in structured activities     SLP LONG TERM GOAL #3   Title The patient will minimize vocal tension via Yawn-Sigh approach (or comparable technique) with min SLP cues with 80% accuracy.   Time 8   Period Weeks   Status Partially Met     SLP LONG TERM GOAL #4   Title The patient will maintain relaxed phonation / oral resonance for paragraph length recitation with 80% accuracy.   Time 8   Period Weeks   Status Partially Met  Successful at certain pitches in structured activity, sentence length          Plan - 06/23/16 1556    Clinical Impression Statement The patient is demonstrating independence in diaphragmatic breathing in structured activities. He is able to achieve phonatory intensity of up to 79 dB in humming at 195 Hz, a pitch that was at the low end of his range when he began treatment and which is now easily achievable for him. In hummed, descending pitch glides, the patient can maintain good vocal quality while descending to 115 Hz, although intensity dwindles to 55 dB at the low end of his range. Speaking at lower pitches while maintaining good vocal quality and audible intensity is more difficult, but the patient is making gradual progress. Currently in structured speech activities at the sentence level, the patient consistently maintains an intensity of 68 dB at 175-195 Hz with fairly stable vocal quality. He would like to achieve a slightly lower pitch in speaking while maintaining good vocal quality and adequate intensity. The patient recognizes the influence of breath support on vocal quality and reports  moderately consistent practice of breathing and voice exercises at home. Although the patient continues throat clearing behavior in response to moments of "growly" vocal quality, he is aware of the behavior and demonstrates emerging ability to implement distraction techniques. The patient reports that friends have commented on the improvements in his voice.     Speech Therapy Frequency 2x / week   Duration Other (comment)  8 weeks   Treatment/Interventions Patient/family education;SLP instruction and feedback;Other (comment)   Potential to Achieve Goals Good   Potential Considerations Ability to learn/carryover information;Co-morbidities;Cooperation/participation level;Previous level of function;Severity of impairments;Family/community support   SLP Home Exercise Plan practice breath support, relaxation, descending pitch glides with humming, increasing loudness with humming, functional phrases, chant talk, neck stretches, straw phonation   Consulted and Agree with Plan of Care Patient      Patient  will benefit from skilled therapeutic intervention in order to improve the following deficits and impairments:   Dysphonia    Problem List There are no active problems to display for this patient.   Rickard Rhymes  Graduate Student Clinician 06/23/2016, 4:01 PM  [place] [facility] MAIN [facility] SERVICES [address] [place], [place], [address] Phone: [phone]   Fax:  [phone]   Name: Jack Harris MRN: [id number] Date of Birth: May 14, 1934

## 2016-06-26 ENCOUNTER — Encounter: Payer: Self-pay | Admitting: Speech Pathology

## 2016-06-26 ENCOUNTER — Ambulatory Visit: Payer: 59 | Admitting: Speech Pathology

## 2016-06-26 DIAGNOSIS — R490 Dysphonia: Secondary | ICD-10-CM | POA: Diagnosis not present

## 2016-06-26 NOTE — Therapy (Signed)
[place] MAIN [facility] SERVICES [address] [place], [place], [address] Phone: [phone]   Fax:  [phone]  Speech Language Pathology Treatment  Patient Details  Name: Jack Harris MRN: [id number] Date of Birth: 1934/01/10 Referring Provider: Dr. Tami Ribas  Encounter Date: 06/26/2016      End of Session - 06/26/16 1153    Visit Number 11   Number of Visits 17   Date for SLP Re-Evaluation 07/02/16   SLP Start Time 1000   SLP Stop Time  1100   SLP Time Calculation (min) 60 min   Activity Tolerance Patient tolerated treatment well      Past Medical History:  Diagnosis Date  . AICD (automatic cardioverter/defibrillator) present    MEDTRONIC  . Aortic insufficiency   . Asthma    childhood asthma  . Atrial fibrillation ([place])   . Cancer ([place])    bladder, 10 yrs ago  . Cancer (HCC)    squamous cell carcinoma  . Cardiomyopathy ([place])   . CHF (congestive heart failure) ([place])   . Hyperlipidemia   . Hypothyroidism   . Paralysis of vocal cords and larynx, unilateral   . Shortness of breath dyspnea     Past Surgical History:  Procedure Laterality Date  . BLADDER SURGERY     Surgeon:Dr. Bernardo Heater, Location [place]   . [place]  . EYE SURGERY Bilateral    Cataract Extraction with IOL  . THYROPLASTY Left 03/02/2016   Procedure: THYROPLASTY;  Surgeon: Beverly Gust, MD;  Location: [facility] ORS;  Service: ENT;  Laterality: Left;    There were no vitals filed for this visit.      Subjective Assessment - 06/26/16 1152    Subjective The patient reports practicing breath support exercises and straw phonation at home. He reports improvement in ability to maintain both air stream and phonation simultaneously when using straw.    Currently in Pain? No/denies               ADULT SLP TREATMENT - 06/26/16 0001      General Information   Behavior/Cognition Alert;Cooperative;Pleasant mood      Treatment Provided   Treatment provided Cognitive-Linquistic     Pain Assessment   Pain Assessment No/denies pain     Cognitive-Linquistic Treatment   Treatment focused on Voice   Skilled Treatment The patient completed neck stretches and warmed up with humming. He achieved approximately 75 dB in humming at approximately 195 Hz. The patient then completed /m/ plus vowels at same pitch using forward resonance, achieving average 73-74 dB. He continued this pattern with chant-talk while practicing functional phrases and generating brief descriptions of pictures. During these tasks, he achieved 72-74 dB at approximately 195 Hz and required intermittent cues and models to implement continuous phonation. Without continuous phonation / chant-like quality, vocal quality deteriorated. The patient practiced descending pitch glides in humming, attaining lowest pitch of 98 Hz with good vocal quality. The patient was able to consistently maintain good vocal quality at 175 Hz when implementing chant-talk / continuous phonation; intensity at this pitch was approximately 67 dB. The lowest pitch the patient was able to attain in speech was 138 Hz, intensity 60-61 dB. Education was provided regarding sounds that the patient can prolong and focus on to help maintain good vocal quality in chant-talk: /m, n, j, w/ and vowels. The cues "use the vowels" and "keep it sing-song" and intermittent models were helpful today.  Assessment / Recommendations / Plan   Plan Continue with current plan of care     Progression Toward Goals   Progression toward goals Progressing toward goals          SLP Education - 06/26/16 1152    Education provided Yes   Education Details Education was provided regarding sounds that the patient can prolong and focus on to help maintain good vocal quality in chant-talk: /m, n, j, w/ and vowels. Helpful strategies were reviewed.    Person(s) Educated Patient   Methods Explanation    Comprehension Verbalized understanding            SLP Long Term Goals - 06/23/16 1558      SLP LONG TERM GOAL #1   Title The patient will demonstrate independent understanding of vocal hygiene concepts and neck, shoulder, lingual stretching exercises.   Time 8   Period Weeks   Status Achieved     SLP LONG TERM GOAL #2   Title The patient will be independent for abdominal breathing and breath support exercises.   Time 8   Period Weeks   Status Partially Met  Met in structured activities     SLP LONG TERM GOAL #3   Title The patient will minimize vocal tension via Yawn-Sigh approach (or comparable technique) with min SLP cues with 80% accuracy.   Time 8   Period Weeks   Status Partially Met     SLP LONG TERM GOAL #4   Title The patient will maintain relaxed phonation / oral resonance for paragraph length recitation with 80% accuracy.   Time 8   Period Weeks   Status Partially Met  Successful at certain pitches in structured activity, sentence length          Plan - 06/26/16 1153    Clinical Impression Statement The patient continues to demonstrate gradual but steady improvement in intensity and lower pitch range while maintaining good vocal quality. He is demonstrating independent and consistent use of breath support in structured activities and beginning to generalize to structured conversation. The patient demonstrates generalization of strategies to conversation in structured activities with intermittent cues.    Speech Therapy Frequency 2x / week   Duration Other (comment)  8 weeks   Treatment/Interventions Patient/family education;SLP instruction and feedback;Other (comment)   Potential to Achieve Goals Good   Potential Considerations Ability to learn/carryover information;Co-morbidities;Cooperation/participation level;Previous level of function;Severity of impairments;Family/community support   SLP Home Exercise Plan practice breath support, relaxation, descending  pitch glides with humming, increasing loudness with humming, functional phrases, chant talk, neck stretches, straw phonation   Consulted and Agree with Plan of Care Patient      Patient will benefit from skilled therapeutic intervention in order to improve the following deficits and impairments:   Dysphonia    Problem List There are no active problems to display for this patient.   Rickard Rhymes  Graduate Student Clinician 06/26/2016, 11:56 AM  [place] MAIN [facility] SERVICES [address] [place], [place], [address] Phone: [phone]   Fax:  [phone]   Name: BOWEN KIA MRN: [id number] Date of Birth: 11-05-1933

## 2016-06-30 ENCOUNTER — Ambulatory Visit: Payer: 59 | Admitting: Speech Pathology

## 2016-06-30 ENCOUNTER — Encounter: Payer: Self-pay | Admitting: Speech Pathology

## 2016-06-30 DIAGNOSIS — R490 Dysphonia: Secondary | ICD-10-CM

## 2016-06-30 NOTE — Therapy (Signed)
[place] MAIN [facility] SERVICES [address] [place], [place], [address] Phone: [phone]   Fax:  [phone]  Speech Language Pathology Treatment  Patient Details  Name: Jack Harris MRN: [id number] Date of Birth: 02-22-1934 Referring Provider: Dr. Tami Ribas  Encounter Date: 06/30/2016      End of Session - 06/30/16 1621    Visit Number 12   Number of Visits 17   Date for SLP Re-Evaluation 07/02/16   SLP Start Time 6962   SLP Stop Time  1540   SLP Time Calculation (min) 55 min   Activity Tolerance Patient tolerated treatment well      Past Medical History:  Diagnosis Date  . AICD (automatic cardioverter/defibrillator) present    MEDTRONIC  . Aortic insufficiency   . Asthma    childhood asthma  . Atrial fibrillation ([place])   . Cancer ([place])    bladder, 10 yrs ago  . Cancer (HCC)    squamous cell carcinoma  . Cardiomyopathy ([place])   . CHF (congestive heart failure) ([place])   . Hyperlipidemia   . Hypothyroidism   . Paralysis of vocal cords and larynx, unilateral   . Shortness of breath dyspnea     Past Surgical History:  Procedure Laterality Date  . BLADDER SURGERY     Surgeon:Dr. Bernardo Heater, Location [place]   . [place]  . EYE SURGERY Bilateral    Cataract Extraction with IOL  . THYROPLASTY Left 03/02/2016   Procedure: THYROPLASTY;  Surgeon: Beverly Gust, MD;  Location: [facility] ORS;  Service: ENT;  Laterality: Left;    There were no vitals filed for this visit.      Subjective Assessment - 06/30/16 1620    Subjective The patient reports daily practice of his exercises and believes that he is improving. The patient used his strategies to make his voice audible in a comment to his spouse.   Patient is accompained by: Family member   Currently in Pain? No/denies               ADULT SLP TREATMENT - 06/30/16 0001      General Information   Behavior/Cognition  Alert;Cooperative;Pleasant mood     Treatment Provided   Treatment provided Cognitive-Linquistic     Pain Assessment   Pain Assessment No/denies pain     Cognitive-Linquistic Treatment   Treatment focused on Voice   Skilled Treatment The patient warmed up with humming, descending pitch glides with hum, and /m/ plus vowel. He was able to produce lowest pitch 123 Hz with good vocal quality and 62 dB intensity. The patient read short phrases at ~205 Hz/average intensity 73 dB and at ~185 Hz/average intensity 72 dB. The patient read sentences and produced short spontaneous responses at average 69 dB, using sing-song intonation at ~205 Hz. He maintained good vocal quality in conversation when provided moderate cues. The patient demonstrated excellent breath support in all structured activities.      Assessment / Recommendations / Plan   Plan Continue with current plan of care     Progression Toward Goals   Progression toward goals Progressing toward goals          SLP Education - 06/30/16 1621    Education provided Yes   Education Details Use /h/ after /t, k, p/ to aid in transition to vowel.   Person(s) Educated Patient   Methods Explanation;Demonstration   Comprehension Verbalized understanding;Returned demonstration  SLP Long Term Goals - 06/23/16 1558      SLP LONG TERM GOAL #1   Title The patient will demonstrate independent understanding of vocal hygiene concepts and neck, shoulder, lingual stretching exercises.   Time 8   Period Weeks   Status Achieved     SLP LONG TERM GOAL #2   Title The patient will be independent for abdominal breathing and breath support exercises.   Time 8   Period Weeks   Status Partially Met  Met in structured activities     SLP LONG TERM GOAL #3   Title The patient will minimize vocal tension via Yawn-Sigh approach (or comparable technique) with min SLP cues with 80% accuracy.   Time 8   Period Weeks   Status Partially Met      SLP LONG TERM GOAL #4   Title The patient will maintain relaxed phonation / oral resonance for paragraph length recitation with 80% accuracy.   Time 8   Period Weeks   Status Partially Met  Successful at certain pitches in structured activity, sentence length          Plan - 06/30/16 1622    Clinical Impression Statement The patient is demonstrating consistent use of breath support in structured activities. His pitch range has increased in the lower pitches, and his max intensity improves slightly each session. The patient requires frequent cues and models in less structured activities, but shows emerging generalization of strategies to maintain intensity and good vocal quality in conversational speech.    Speech Therapy Frequency 2x / week   Duration Other (comment)  8 weeks   Treatment/Interventions Patient/family education;SLP instruction and feedback;Other (comment)   Potential to Achieve Goals Good   Potential Considerations Ability to learn/carryover information;Co-morbidities;Cooperation/participation level;Previous level of function;Severity of impairments;Family/community support   SLP Home Exercise Plan practice breath support, relaxation, descending pitch glides with humming, increasing loudness with humming, functional phrases, chant talk, neck stretches, straw phonation. Use good vocal quality in conversation!   Consulted and Agree with Plan of Care Patient      Patient will benefit from skilled therapeutic intervention in order to improve the following deficits and impairments:   Dysphonia    Problem List There are no active problems to display for this patient.   Rickard Rhymes  Graduate Student Clinician 06/30/2016, 4:23 PM  [place] MAIN [facility] SERVICES [address] [place], [place], [address] Phone: [phone]   Fax:  [phone]   Name: ERMON SAGAN MRN: [id number] Date of Birth: 1934-07-26

## 2016-07-03 ENCOUNTER — Ambulatory Visit: Payer: 59 | Admitting: Speech Pathology

## 2016-07-03 ENCOUNTER — Encounter: Payer: Self-pay | Admitting: Speech Pathology

## 2016-07-03 DIAGNOSIS — R490 Dysphonia: Secondary | ICD-10-CM

## 2016-07-03 NOTE — Therapy (Signed)
[place] MAIN [facility] SERVICES [address] [place], [place], [address] Phone: [phone]   Fax:  [phone]  Speech Language Pathology Treatment  Patient Details  Name: Jack Harris MRN: [id number] Date of Birth: 02/29/1934 Referring Provider: Dr. Tami Ribas  Encounter Date: 07/03/2016      End of Session - 07/03/16 1321    Visit Number 13   Number of Visits 17   Date for SLP Re-Evaluation 07/02/16   SLP Start Time 1000   SLP Stop Time  1055   SLP Time Calculation (min) 55 min   Activity Tolerance Patient tolerated treatment well      Past Medical History:  Diagnosis Date  . AICD (automatic cardioverter/defibrillator) present    MEDTRONIC  . Aortic insufficiency   . Asthma    childhood asthma  . Atrial fibrillation ([place])   . Cancer ([place])    bladder, 10 yrs ago  . Cancer (HCC)    squamous cell carcinoma  . Cardiomyopathy ([place])   . CHF (congestive heart failure) ([place])   . Hyperlipidemia   . Hypothyroidism   . Paralysis of vocal cords and larynx, unilateral   . Shortness of breath dyspnea     Past Surgical History:  Procedure Laterality Date  . BLADDER SURGERY     Surgeon:Dr. Bernardo Heater, Location [place]   . [place]  . EYE SURGERY Bilateral    Cataract Extraction with IOL  . THYROPLASTY Left 03/02/2016   Procedure: THYROPLASTY;  Surgeon: Beverly Gust, MD;  Location: [facility] ORS;  Service: ENT;  Laterality: Left;    There were no vitals filed for this visit.      Subjective Assessment - 07/03/16 1320    Subjective The patient reported some cold symptoms over the weekend and the possibility that they might affect his vocal performance today. The patient agrees with the observation that he uses good breath support consistently in structured activities but forgets in conversation.     Currently in Pain? No/denies               ADULT SLP TREATMENT - 07/03/16 0001       General Information   Behavior/Cognition Alert;Cooperative;Pleasant mood     Treatment Provided   Treatment provided Cognitive-Linquistic     Pain Assessment   Pain Assessment No/denies pain     Cognitive-Linquistic Treatment   Treatment focused on Voice   Skilled Treatment The patient warmed up with humming, 77 dB. /m/ plus vowel produced intensity up to 79 dB with good vocal quality and no tension. He completed descending glides with humming, from 220 Hz at 77 dB to 139 Hz at 67 dB. He completed ascending glides with humming, from 131 Hz at 65 dB to 220 Hz at 70 dB. His vocal quality when beginning at the low pitch of 131 Hz was good - great progress! The patient completed extended straw phonation exercise with the following results: maintain steady bubble stream & phonation at 175 Hz, maintain steady bubble stream & phonation during descending pitch glide from 196 Hz to 104 Hz (great!), maintain steady ripple & phonation during descending pitch glide from 196 Hz to 110 Hz (great!), maintain steady airflow through partially occluded straw during descending pitch glide from 196 Hz to 110 Hz, maintain steady airflow through unoccluded straw during descending pitch glide from 196 Hz to 100 Hz, produce continuous air flow and phonation through rounded lips ("w-v") at 220 Hz.  Overall, the patient's performance in straw phonation tasks demonstrated great progress in simultaneous control of breath support and phonation over the last few weeks. In reading phrases, the patient regularly achieved 71-73 dB when using sing-song technique ("chant talk"). In longer spontaneous responses moving toward more natural speech quality, the patient maintained good vocal quality with moderate cueing; intensity was usually 65-67 dB but occasionally up to 73 dB. The patient independently identified 11 of 17 throat clearing events during the session.       Assessment / Recommendations / Plan   Plan Continue with current  plan of care     Progression Toward Goals   Progression toward goals Progressing toward goals          SLP Education - 07/03/16 1320    Education Details Extended straw phonation exercises, using improved vocal quality and intensity in reading headlines aloud, use /h/ before phrase beginning with vowel    Person(s) Educated Patient   Methods Explanation;Demonstration   Comprehension Verbalized understanding            SLP Long Term Goals - 06/23/16 1558      SLP LONG TERM GOAL #1   Title The patient will demonstrate independent understanding of vocal hygiene concepts and neck, shoulder, lingual stretching exercises.   Time 8   Period Weeks   Status Achieved     SLP LONG TERM GOAL #2   Title The patient will be independent for abdominal breathing and breath support exercises.   Time 8   Period Weeks   Status Partially Met  Met in structured activities     SLP LONG TERM GOAL #3   Title The patient will minimize vocal tension via Yawn-Sigh approach (or comparable technique) with min SLP cues with 80% accuracy.   Time 8   Period Weeks   Status Partially Met     SLP LONG TERM GOAL #4   Title The patient will maintain relaxed phonation / oral resonance for paragraph length recitation with 80% accuracy.   Time 8   Period Weeks   Status Partially Met  Successful at certain pitches in structured activity, sentence length          Plan - 07/03/16 1322    Clinical Impression Statement The patient is demonstrating significantly improved control of breath support and phonation in structured activities. His pitch range has increased in the lower pitches, and his max intensity improves slightly each session. The patient requires frequent cues and models in less structured activities, but shows emerging generalization of strategies to maintain intensity and good vocal quality in conversational speech.    Speech Therapy Frequency 2x / week   Duration Other (comment)  8 weeks    Treatment/Interventions Patient/family education;SLP instruction and feedback;Other (comment)   Potential to Achieve Goals Good   Potential Considerations Ability to learn/carryover information;Co-morbidities;Cooperation/participation level;Previous level of function;Severity of impairments;Family/community support   SLP Home Exercise Plan practice breath support, relaxation, descending pitch glides with humming, increasing loudness with humming, functional phrases, chant talk, neck stretches, straw phonation. Use good vocal quality in conversation!   Consulted and Agree with Plan of Care Patient      Patient will benefit from skilled therapeutic intervention in order to improve the following deficits and impairments:   Dysphonia    Problem List There are no active problems to display for this patient.   Rickard Rhymes  Graduate Student Clinician 07/03/2016, 1:23 PM  [place] MAIN [facility] SERVICES [phone]  [place], [place], [address] Phone: [phone]   Fax:  [phone]   Name: Jack Harris MRN: [id number] Date of Birth: 06-16-1934

## 2016-07-10 ENCOUNTER — Ambulatory Visit: Payer: 59 | Admitting: Speech Pathology

## 2016-07-10 ENCOUNTER — Encounter: Payer: Self-pay | Admitting: Speech Pathology

## 2016-07-10 DIAGNOSIS — R490 Dysphonia: Secondary | ICD-10-CM

## 2016-07-10 NOTE — Therapy (Signed)
[place] MAIN [facility] SERVICES [address] [place], [place], [address] Phone: [phone]   Fax:  [phone]  Speech Language Pathology Treatment  Patient Details  Name: Jack Harris MRN: [id number] Date of Birth: 04/14/1934 Referring Provider: Dr. Tami Ribas  Encounter Date: 07/10/2016      End of Session - 07/10/16 1151    Visit Number 14   Number of Visits 17   Date for SLP Re-Evaluation 07/02/16   SLP Start Time 1007   SLP Stop Time  1100   SLP Time Calculation (min) 53 min   Activity Tolerance Patient tolerated treatment well      Past Medical History:  Diagnosis Date  . AICD (automatic cardioverter/defibrillator) present    MEDTRONIC  . Aortic insufficiency   . Asthma    childhood asthma  . Atrial fibrillation ([place])   . Cancer ([place])    bladder, 10 yrs ago  . Cancer (HCC)    squamous cell carcinoma  . Cardiomyopathy ([place])   . CHF (congestive heart failure) ([place])   . Hyperlipidemia   . Hypothyroidism   . Paralysis of vocal cords and larynx, unilateral   . Shortness of breath dyspnea     Past Surgical History:  Procedure Laterality Date  . BLADDER SURGERY     Surgeon:Dr. Bernardo Heater, Location [place]   . [place]  . EYE SURGERY Bilateral    Cataract Extraction with IOL  . THYROPLASTY Left 03/02/2016   Procedure: THYROPLASTY;  Surgeon: Beverly Gust, MD;  Location: [facility] ORS;  Service: ENT;  Laterality: Left;    There were no vitals filed for this visit.      Subjective Assessment - 07/10/16 1149    Subjective The patient reported that family members were able to hear and understand him over Thanksgiving meal and time spent together.    Currently in Pain? No/denies               ADULT SLP TREATMENT - 07/10/16 0001      General Information   Behavior/Cognition Alert;Cooperative;Pleasant mood     Treatment Provided   Treatment provided Cognitive-Linquistic      Pain Assessment   Pain Assessment No/denies pain     Cognitive-Linquistic Treatment   Treatment focused on Voice   Skilled Treatment The patient produced intensity of 77 dB while maintaining good vocal quality in humming at 175-196 Hz. He achieved 70 dB while humming at 147 Hz. The patient completed neck stretches and descending pitch glides. He produced short phrases at 175-185 Hz and up to 70 dB while maintaining good vocal quality. The patient self-monitored and self-corrected vocal quality with moderate cues while reading sentences aloud and producing short to medium-length spontaneous responses. Average intensity during these exercises was mid 60s dB, occasionally up to 70 dB. The patient required frequent cues to apply his "good voice" to spontaneous conversation but was able to succeed in generalizing when cued and when encouraged to pause to correct poor vocal quality.      Assessment / Recommendations / Plan   Plan Continue with current plan of care     Progression Toward Goals   Progression toward goals Progressing toward goals          SLP Education - 07/10/16 1150    Education provided Yes   Education Details generalizing behaviors to all speaking contexts, use /h/ before vowels to help maintain good vocal quality   Person(s) Educated  Patient   Methods Explanation   Comprehension Verbalized understanding            SLP Long Term Goals - 06/23/16 1558      SLP LONG TERM GOAL #1   Title The patient will demonstrate independent understanding of vocal hygiene concepts and neck, shoulder, lingual stretching exercises.   Time 8   Period Weeks   Status Achieved     SLP LONG TERM GOAL #2   Title The patient will be independent for abdominal breathing and breath support exercises.   Time 8   Period Weeks   Status Partially Met  Met in structured activities     SLP LONG TERM GOAL #3   Title The patient will minimize vocal tension via Yawn-Sigh approach (or  comparable technique) with min SLP cues with 80% accuracy.   Time 8   Period Weeks   Status Partially Met     SLP LONG TERM GOAL #4   Title The patient will maintain relaxed phonation / oral resonance for paragraph length recitation with 80% accuracy.   Time 8   Period Weeks   Status Partially Met  Successful at certain pitches in structured activity, sentence length          Plan - 07/10/16 1151    Clinical Impression Statement The patient is demonstrating significantly improved control of breath support and phonation in structured activities. His pitch range has increased in the lower pitches, and his max intensity improves slightly each session. The patient requires frequent cues and models in less structured activities, but shows emerging generalization of strategies to maintain intensity and good vocal quality in conversational speech.  The patient demonstrated markedly reduced throat clearing today - 5 or fewer instances throughout therapy session.   Speech Therapy Frequency 2x / week   Duration Other (comment)  8 weeks   Treatment/Interventions Patient/family education;SLP instruction and feedback;Other (comment)   Potential to Achieve Goals Good   Potential Considerations Ability to learn/carryover information;Co-morbidities;Cooperation/participation level;Previous level of function;Severity of impairments;Family/community support   SLP Home Exercise Plan practice breath support, relaxation, descending pitch glides with humming, increasing loudness with humming, functional phrases, chant talk, neck stretches, straw phonation. Use good vocal quality in conversation!   Consulted and Agree with Plan of Care Patient      Patient will benefit from skilled therapeutic intervention in order to improve the following deficits and impairments:   Dysphonia    Problem List There are no active problems to display for this patient.   Rickard Rhymes  Graduate Student  Clinician 07/10/2016, 11:53 AM  [place] MAIN [facility] SERVICES [address] [place], [place], [address] Phone: [phone]   Fax:  [phone]   Name: Jack Harris MRN: [id number] Date of Birth: [birth year]

## 2016-07-13 ENCOUNTER — Ambulatory Visit: Payer: 59 | Admitting: Speech Pathology

## 2016-07-17 ENCOUNTER — Ambulatory Visit: Payer: 59 | Admitting: Speech Pathology

## 2016-07-19 ENCOUNTER — Ambulatory Visit: Payer: 59 | Admitting: Speech Pathology

## 2016-07-21 ENCOUNTER — Ambulatory Visit: Payer: 59 | Admitting: Speech Pathology

## 2016-07-26 ENCOUNTER — Ambulatory Visit: Payer: 59 | Admitting: Speech Pathology

## 2016-07-27 ENCOUNTER — Ambulatory Visit: Payer: 59 | Attending: Unknown Physician Specialty | Admitting: Speech Pathology

## 2016-07-27 DIAGNOSIS — R490 Dysphonia: Secondary | ICD-10-CM | POA: Diagnosis present

## 2016-07-28 ENCOUNTER — Encounter: Payer: Self-pay | Admitting: Speech Pathology

## 2016-07-28 NOTE — Therapy (Signed)
[place] MAIN [facility] SERVICES [address] [place], [place], [address] Phone: [phone]   Fax:  [phone]  Speech Language Pathology Treatment  Patient Details  Name: Jack Harris MRN: [id number] Date of Birth: [birth year] Referring Provider: Dr. Tami Ribas  Encounter Date: 07/27/2016      End of Session - 07/28/16 0751    Visit Number 15   Number of Visits 17   Date for SLP Re-Evaluation 07/02/16   SLP Start Time 1415   SLP Stop Time  1500   SLP Time Calculation (min) 45 min      Past Medical History:  Diagnosis Date  . AICD (automatic cardioverter/defibrillator) present    MEDTRONIC  . Aortic insufficiency   . Asthma    childhood asthma  . Atrial fibrillation ([place])   . Cancer ([place])    bladder, 10 yrs ago  . Cancer (HCC)    squamous cell carcinoma  . Cardiomyopathy ([place])   . CHF (congestive heart failure) ([place])   . Hyperlipidemia   . Hypothyroidism   . Paralysis of vocal cords and larynx, unilateral   . Shortness of breath dyspnea     Past Surgical History:  Procedure Laterality Date  . BLADDER SURGERY     Surgeon:Dr. Bernardo Heater, Location [place]   . [place]  . EYE SURGERY Bilateral    Cataract Extraction with IOL  . THYROPLASTY Left 03/02/2016   Procedure: THYROPLASTY;  Surgeon: Beverly Gust, MD;  Location: [facility] ORS;  Service: ENT;  Laterality: Left;    There were no vitals filed for this visit.      Subjective Assessment - 07/28/16 0749    Subjective The patient agrees that his voice is much improved but he would like a lower pitch and more clarity               ADULT SLP TREATMENT - 07/28/16 0001      General Information   Behavior/Cognition Alert;Cooperative;Pleasant mood     Treatment Provided   Treatment provided Cognitive-Linquistic     Pain Assessment   Pain Assessment No/denies pain     Cognitive-Linquistic Treatment   Treatment focused  on Voice   Skilled Treatment The patient was provided with written and verbal teaching for supplement vocal tract relaxation exercises (tongue trills).  The patient achieves true voice at elevated pitch with tongue trill and demonstrates improved pitch control with the trill.  Patient was provided with written and verbal teaching regarding breath support exercises.  Continues with loudness practice using voiceless fricatives (s,f,th).  The patient demonstrates improving strength of abdominal breathing and breath support, as well as awareness of poor breathing patterns.  Patient instructed in relaxed phonation / oral resonance.  Patient instructed in resonant voice therapy techniques with variable success.  Patient was able to generate true oral focus on "ah" using a lax tongue, slightly forward.     Assessment / Recommendations / Plan   Plan Continue with current plan of care     Progression Toward Goals   Progression toward goals Progressing toward goals          SLP Education - 07/28/16 0750    Education provided Yes   Education Details Resonant voice therapy   Person(s) Educated Patient   Methods Explanation   Comprehension Verbalized understanding            SLP Long Term Goals - 06/23/16 1558  SLP LONG TERM GOAL #1   Title The patient will demonstrate independent understanding of vocal hygiene concepts and neck, shoulder, lingual stretching exercises.   Time 8   Period Weeks   Status Achieved     SLP LONG TERM GOAL #2   Title The patient will be independent for abdominal breathing and breath support exercises.   Time 8   Period Weeks   Status Partially Met  Met in structured activities     SLP LONG TERM GOAL #3   Title The patient will minimize vocal tension via Yawn-Sigh approach (or comparable technique) with min SLP cues with 80% accuracy.   Time 8   Period Weeks   Status Partially Met     SLP LONG TERM GOAL #4   Title The patient will maintain relaxed  phonation / oral resonance for paragraph length recitation with 80% accuracy.   Time 8   Period Weeks   Status Partially Met  Successful at certain pitches in structured activity, sentence length          Plan - 07/28/16 0751    Clinical Impression Statement Patient was able to generate true oral focus on "ah" using a lax tongue, slightly forward.  The patient continues to have difficulty with maintaining the clarity when lowering pitch.       Speech Therapy Frequency 2x / week   Duration Other (comment)   Treatment/Interventions Patient/family education;SLP instruction and feedback;Other (comment)   Potential to Achieve Goals Good   Potential Considerations Ability to learn/carryover information;Co-morbidities;Cooperation/participation level;Previous level of function;Severity of impairments;Family/community support   SLP Home Exercise Plan practice breath support, relaxation, descending pitch glides with humming, increasing loudness with humming, functional phrases, chant talk, neck stretches, straw phonation. Use good vocal quality in conversation!   Consulted and Agree with Plan of Care Patient      Patient will benefit from skilled therapeutic intervention in order to improve the following deficits and impairments:   Dysphonia    Problem List There are no active problems to display for this patient.  Jack Sea, MS/CCC- SLP  Lou Miner 07/28/2016, 7:53 AM  [place] MAIN [facility] SERVICES [address] [place], [place], [address] Phone: [phone]   Fax:  [phone]   Name: Jack Harris MRN: [id number] Date of Birth: 1934-04-19

## 2016-08-02 ENCOUNTER — Ambulatory Visit: Payer: 59 | Admitting: Speech Pathology

## 2016-08-04 ENCOUNTER — Ambulatory Visit: Payer: 59 | Admitting: Speech Pathology

## 2016-08-09 ENCOUNTER — Encounter: Payer: Self-pay | Admitting: Speech Pathology

## 2016-08-09 ENCOUNTER — Ambulatory Visit: Payer: 59 | Admitting: Speech Pathology

## 2016-08-09 DIAGNOSIS — R490 Dysphonia: Secondary | ICD-10-CM

## 2016-08-09 NOTE — Therapy (Signed)
[place] MAIN [facility] SERVICES [address] [place], [place], [address] Phone: [phone]   Fax:  [phone]  Speech Language Pathology Treatment  Patient Details  Name: Jack Harris MRN: [id number] Date of Birth: 25-Oct-1933 Referring Provider: Dr. Tami Ribas  Encounter Date: 08/09/2016      End of Session - 08/09/16 1144    Visit Number 16   Number of Visits 17   Date for SLP Re-Evaluation 07/02/16   SLP Start Time 0900   SLP Stop Time  1000   SLP Time Calculation (min) 60 min      Past Medical History:  Diagnosis Date  . AICD (automatic cardioverter/defibrillator) present    MEDTRONIC  . Aortic insufficiency   . Asthma    childhood asthma  . Atrial fibrillation ([place])   . Cancer ([place])    bladder, 10 yrs ago  . Cancer (HCC)    squamous cell carcinoma  . Cardiomyopathy ([place])   . CHF (congestive heart failure) ([place])   . Hyperlipidemia   . Hypothyroidism   . Paralysis of vocal cords and larynx, unilateral   . Shortness of breath dyspnea     Past Surgical History:  Procedure Laterality Date  . BLADDER SURGERY     Surgeon:Dr. Bernardo Heater, Location [place]   . [place]  . EYE SURGERY Bilateral    Cataract Extraction with IOL  . THYROPLASTY Left 03/02/2016   Procedure: THYROPLASTY;  Surgeon: Beverly Gust, MD;  Location: [facility] ORS;  Service: ENT;  Laterality: Left;    There were no vitals filed for this visit.      Subjective Assessment - 08/09/16 1143    Subjective The patient agrees that his voice is much improved but he would like a lower pitch and more clarity   Currently in Pain? No/denies               ADULT SLP TREATMENT - 08/09/16 0001      General Information   Behavior/Cognition Alert;Cooperative;Pleasant mood     Treatment Provided   Treatment provided Cognitive-Linquistic     Pain Assessment   Pain Assessment No/denies pain     Cognitive-Linquistic  Treatment   Treatment focused on Voice   Skilled Treatment The patient was provided with written and verbal teaching for supplement vocal tract relaxation exercises (tongue trills).  Patient was provided with written and verbal teaching regarding breath support exercises.  Continues with loudness practice using voiceless fricatives (s,f,th).  Overall, the patient demonstrates improving strength of abdominal breathing and breath support, as well as awareness of poor breathing patterns.  He benefited from re-education of abdominal breathing.  Patient instructed in relaxed phonation / oral resonance.  Patient instructed in resonant voice therapy techniques with success using s/z contrast to maintain oral focus.  Patient was able to generate improved oral focus on initial /z/ words after practice of /z/ in isolation.     Assessment / Recommendations / Plan   Plan Continue with current plan of care     Progression Toward Goals   Progression toward goals Progressing toward goals          SLP Education - 08/09/16 1143    Education provided Yes   Education Details Slowly/thoughtfully  complete exercises, but do not dwell on errors   Person(s) Educated Patient   Methods Explanation   Comprehension Verbalized understanding            SLP Long  Term Goals - 06/23/16 1558      SLP LONG TERM GOAL #1   Title The patient will demonstrate independent understanding of vocal hygiene concepts and neck, shoulder, lingual stretching exercises.   Time 8   Period Weeks   Status Achieved     SLP LONG TERM GOAL #2   Title The patient will be independent for abdominal breathing and breath support exercises.   Time 8   Period Weeks   Status Partially Met  Met in structured activities     SLP LONG TERM GOAL #3   Title The patient will minimize vocal tension via Yawn-Sigh approach (or comparable technique) with min SLP cues with 80% accuracy.   Time 8   Period Weeks   Status Partially Met     SLP  LONG TERM GOAL #4   Title The patient will maintain relaxed phonation / oral resonance for paragraph length recitation with 80% accuracy.   Time 8   Period Weeks   Status Partially Met  Successful at certain pitches in structured activity, sentence length          Plan - 08/09/16 1145    Clinical Impression Statement Patient was able to generate improved oral focus using s/z contrast to maintain oral focus.  The patient was instructed to go through his exercises without trying to repair errors (he tends to simply practice the wrong technique rather than improve).  The patient continues to have difficulty with maintaining the clarity when lowering pitch.       Speech Therapy Frequency 2x / week   Duration Other (comment)   Treatment/Interventions Patient/family education;SLP instruction and feedback;Other (comment)   Potential to Achieve Goals Good   Potential Considerations Ability to learn/carryover information;Co-morbidities;Cooperation/participation level;Previous level of function;Severity of impairments;Family/community support   SLP Home Exercise Plan practice breath support, relaxation, descending pitch glides with humming, increasing loudness with humming, functional phrases, chant talk, neck stretches, straw phonation. Use good vocal quality in conversation!   Consulted and Agree with Plan of Care Patient      Patient will benefit from skilled therapeutic intervention in order to improve the following deficits and impairments:   Dysphonia    Problem List There are no active problems to display for this patient.  [name], MS/CCC- SLP  Lou Miner 08/09/2016, 11:45 AM  [place] MAIN [facility] SERVICES [address] [place], [place], [address] Phone: [phone]   Fax:  [phone]   Name: Jack Harris MRN: [id number] Date of Birth: [birth year]

## 2016-08-17 ENCOUNTER — Ambulatory Visit: Payer: Medicare Other | Admitting: Speech Pathology

## 2016-08-21 ENCOUNTER — Ambulatory Visit: Payer: Medicare Other | Attending: Unknown Physician Specialty | Admitting: Speech Pathology

## 2016-08-21 DIAGNOSIS — R490 Dysphonia: Secondary | ICD-10-CM | POA: Insufficient documentation

## 2016-08-22 ENCOUNTER — Encounter: Payer: Self-pay | Admitting: Speech Pathology

## 2016-08-22 NOTE — Therapy (Signed)
[place] MAIN [facility] SERVICES [address] [place], [place], [address] Phone: [phone]   Fax:  [phone]  Speech Language Pathology Treatment  Patient Details  Name: Jack Harris MRN: [id number] Date of Birth: 1934/03/26 Referring Provider: Dr. Tami Ribas  Encounter Date: 08/21/2016      End of Session - 08/22/16 0835    Visit Number 17   Number of Visits 17   Date for SLP Re-Evaluation 07/02/16   SLP Start Time 1100   SLP Stop Time  1200   SLP Time Calculation (min) 60 min      Past Medical History:  Diagnosis Date  . AICD (automatic cardioverter/defibrillator) present    MEDTRONIC  . Aortic insufficiency   . Asthma    childhood asthma  . Atrial fibrillation ([place])   . Cancer ([place])    bladder, 10 yrs ago  . Cancer (HCC)    squamous cell carcinoma  . Cardiomyopathy ([place])   . CHF (congestive heart failure) ([place])   . Hyperlipidemia   . Hypothyroidism   . Paralysis of vocal cords and larynx, unilateral   . Shortness of breath dyspnea     Past Surgical History:  Procedure Laterality Date  . BLADDER SURGERY     Surgeon:Dr. Bernardo Heater, Location [place]   . [place]  . EYE SURGERY Bilateral    Cataract Extraction with IOL  . THYROPLASTY Left 03/02/2016   Procedure: THYROPLASTY;  Surgeon: Beverly Gust, MD;  Location: [facility] OR[facility];  Service: ENT;  Laterality: Left;    There were no vitals filed for this visit.      Subjective Assessment - 08/22/16 0834    Subjective The patient agrees that his voice is much improved but he would like a lower pitch and more clarity               ADULT SLP TREATMENT - 08/22/16 0001      General Information   Behavior/Cognition Alert;Cooperative;Pleasant mood     Treatment Provided   Treatment provided Cognitive-Linquistic     Pain Assessment   Pain Assessment No/denies pain     Cognitive-Linquistic Treatment   Treatment focused on  Voice   Skilled Treatment The patient was provided with written and verbal teaching for supplement vocal tract relaxation exercises (tongue trills).  Patient was provided with written and verbal teaching regarding breath support exercises.  Continues with loudness practice using voiceless fricatives (s,f,th).  Overall, the patient demonstrates improving strength of abdominal breathing and breath support, as well as awareness of poor breathing patterns.  He benefited from re-education of abdominal breathing.  Patient instructed in relaxed phonation / oral resonance.  Patient instructed in resonant voice therapy techniques with success using s/z contrast to maintain oral focus.  Patient was able to generate improved oral focus on initial /z/ words after practice of /z/ in isolation. Patient able to generate improved tone focus with flow phonation and into initial /w/ words with 80% accuracy.  Attempting generalization by immediately chanting / counting 1-5 and imitating his sentences with more forward tone focus.     Assessment / Recommendations / Plan   Plan Continue with current plan of care     Progression Toward Goals   Progression toward goals Progressing toward goals          SLP Education - 08/22/16 0834    Education provided Yes   Education Details Slowly/thoughtfully complete exercises, but do not dwell  on errors.   Person(s) Educated Patient   Methods Explanation   Comprehension Verbalized understanding            SLP Long Term Goals - 06/23/16 1558      SLP LONG TERM GOAL #1   Title The patient will demonstrate independent understanding of vocal hygiene concepts and neck, shoulder, lingual stretching exercises.   Time 8   Period Weeks   Status Achieved     SLP LONG TERM GOAL #2   Title The patient will be independent for abdominal breathing and breath support exercises.   Time 8   Period Weeks   Status Partially Met  Met in structured activities     SLP LONG TERM  GOAL #3   Title The patient will minimize vocal tension via Yawn-Sigh approach (or comparable technique) with min SLP cues with 80% accuracy.   Time 8   Period Weeks   Status Partially Met     SLP LONG TERM GOAL #4   Title The patient will maintain relaxed phonation / oral resonance for paragraph length recitation with 80% accuracy.   Time 8   Period Weeks   Status Partially Met  Successful at certain pitches in structured activity, sentence length          Plan - 08/22/16 0836    Clinical Impression Statement Patient was able to generate improved oral focus using s/z contrast to maintain oral focus.  The patient was instructed to go through his exercises without trying to repair errors (he tends to simply practice the wrong technique rather than improve).  The patient was given guidance for pitch glide exercise.  The patient continues to have difficulty with maintaining the clarity when lowering pitch.       Speech Therapy Frequency 2x / week   Duration Other (comment)   Treatment/Interventions Patient/family education;SLP instruction and feedback;Other (comment)  Voice therapy   Potential to Achieve Goals Good   Potential Considerations Ability to learn/carryover information;Co-morbidities;Cooperation/participation level;Previous level of function;Severity of impairments;Family/community support   SLP Home Exercise Plan practice breath support, relaxation, descending pitch glides with humming, increasing loudness with humming, functional phrases, chant talk, neck stretches, straw phonation. Use good vocal quality in conversation!   Consulted and Agree with Plan of Care Patient      Patient will benefit from skilled therapeutic intervention in order to improve the following deficits and impairments:   Dysphonia    Problem List There are no active problems to display for this patient.  Jack Sea, MS/CCC- SLP  Jack Harris 08/22/2016, 8:37 AM  [place] MAIN [facility] SERVICES [address] [place], [place], [address] Phone: [phone]   Fax:  [phone]   Name: Jack Harris MRN: [id number] Date of Birth: 08-Jan-1934

## 2016-08-23 ENCOUNTER — Ambulatory Visit: Payer: Medicare Other | Admitting: Speech Pathology

## 2016-08-23 DIAGNOSIS — R490 Dysphonia: Secondary | ICD-10-CM | POA: Diagnosis not present

## 2016-08-24 ENCOUNTER — Encounter: Payer: Self-pay | Admitting: Speech Pathology

## 2016-08-24 NOTE — Therapy (Signed)
[place] MAIN [facility] SERVICES [address] [place], [place], [address] Phone: [phone]   Fax:  [phone]  Speech Language Pathology Treatment/Re-certification  Patient Details  Name: Jack Harris MRN: [id number] Date of Birth: 1934/03/19 Referring Provider: Dr. Tami Ribas  Encounter Date: 08/23/2016      End of Session - 08/24/16 0941    Visit Number 18   Number of Visits 24   Date for SLP Re-Evaluation 09/23/16   SLP Start Time 1500   SLP Stop Time  1555   SLP Time Calculation (min) 55 min   Activity Tolerance Patient tolerated treatment well      Past Medical History:  Diagnosis Date  . AICD (automatic cardioverter/defibrillator) present    MEDTRONIC  . Aortic insufficiency   . Asthma    childhood asthma  . Atrial fibrillation ([place])   . Cancer ([place])    bladder, 10 yrs ago  . Cancer (HCC)    squamous cell carcinoma  . Cardiomyopathy ([place])   . CHF (congestive heart failure) ([place])   . Hyperlipidemia   . Hypothyroidism   . Paralysis of vocal cords and larynx, unilateral   . Shortness of breath dyspnea     Past Surgical History:  Procedure Laterality Date  . BLADDER SURGERY     Surgeon:Dr. Bernardo Heater, Location [place]   . [place]  . EYE SURGERY Bilateral    Cataract Extraction with IOL  . THYROPLASTY Left 03/02/2016   Procedure: THYROPLASTY;  Surgeon: Beverly Gust, MD;  Location: [facility];  Service: ENT;  Laterality: Left;    There were no vitals filed for this visit.      Subjective Assessment - 08/24/16 0941    Subjective The patient agrees that his voice is much improved but he would like a lower pitch and more clarity   Currently in Pain? No/denies               ADULT SLP TREATMENT - 08/24/16 0001      General Information   Behavior/Cognition Alert;Cooperative;Pleasant mood     Treatment Provided   Treatment provided Cognitive-Linquistic     Pain  Assessment   Pain Assessment No/denies pain     Cognitive-Linquistic Treatment   Treatment focused on Voice   Skilled Treatment The patient was provided with written and verbal teaching for supplement vocal tract relaxation exercises (tongue trills).  Patient was provided with written and verbal teaching regarding breath support exercises.  Continues with loudness practice using voiceless fricatives (s,f,th).  Overall, the patient demonstrates improving strength of abdominal breathing and breath support, as well as awareness of poor breathing patterns.  He benefited from re-education of abdominal breathing.  Patient instructed in relaxed phonation / oral resonance.  Patient instructed in resonant voice therapy techniques with success using s/z contrast to maintain oral focus.  Patient was able to generate improved oral focus on initial /z/ words after practice of /z/ in isolation. Patient able to generate improved tone focus with flow phonation and into initial /w/ words with 80% accuracy.  Improved generalization by saying /w/ words with clear vocal quality (albeit muffled and high pitched) and able to maintain improved quality and loudness for 20 minutes conversation.     Assessment / Recommendations / Plan   Plan Continue with current plan of care     Progression Toward Goals   Progression toward goals Progressing toward goals  SLP Education - 08/24/16 0941    Education provided Yes   Education Details Posture exercises   Person(s) Educated Patient   Methods Explanation;Demonstration;Handout   Comprehension Verbalized understanding            SLP Long Term Goals - 08/24/16 0945      SLP LONG TERM GOAL #2   Title The patient will be independent for abdominal breathing and breath support exercises.   Time 4   Period Weeks   Status Partially Met     SLP LONG TERM GOAL #3   Title The patient will minimize vocal tension via Yawn-Sigh approach (or comparable technique)  with min SLP cues with 80% accuracy.   Time 4   Period Weeks   Status Partially Met     SLP LONG TERM GOAL #4   Title The patient will maintain relaxed phonation / oral resonance for paragraph length recitation with 80% accuracy.   Time 4   Period Weeks   Status Partially Met          Plan - 08/24/16 0943    Clinical Impression Statement Patient was able to generate improved vocal quality using s/z contrast and initial /w/ words to maintain clear vocal quality (albeit muffled and high pitched).  The patient was instructed to go through his exercises without trying to repair errors (he tends to simply practice the wrong technique rather than improve).  The patient was given guidance for pitch glide exercise.  The patient continues to have difficulty with maintaining the clarity when lowering pitch.  Although the patient reports improved voice for his daily communication needs, he has not met his goals and would like to continue speech/voice therapy at this time.   Speech Therapy Frequency 2x / week   Duration 4 weeks   Treatment/Interventions Patient/family education;SLP instruction and feedback;Other (comment)  Voice therapy   Potential to Achieve Goals Good   Potential Considerations Ability to learn/carryover information;Co-morbidities;Cooperation/participation level;Previous level of function;Severity of impairments;Family/community support   SLP Home Exercise Plan practice breath support, relaxation, descending pitch glides with humming, increasing loudness with humming, functional phrases, chant talk, neck stretches, straw phonation. Use good vocal quality in conversation! Posture exercise      Patient will benefit from skilled therapeutic intervention in order to improve the following deficits and impairments:   Dysphonia - Plan: SLP plan of care cert/re-cert    Problem List There are no active problems to display for this patient.  [name], MS/CCC-  SLP  Lou Miner 08/24/2016, 9:48 AM  [place] MAIN [facility] SERVICES [address] [place], [place], [address] Phone: [phone]   Fax:  [phone]   Name: Jack Harris MRN: [id number] Date of Birth: 1933-12-19

## 2016-08-28 ENCOUNTER — Ambulatory Visit: Payer: Medicare Other | Admitting: Speech Pathology

## 2016-08-28 DIAGNOSIS — R490 Dysphonia: Secondary | ICD-10-CM

## 2016-08-29 ENCOUNTER — Encounter: Payer: Self-pay | Admitting: Speech Pathology

## 2016-08-29 NOTE — Therapy (Signed)
[place] MAIN [facility] SERVICES [address] [place], [place], [address] Phone: [phone]   Fax:  [phone]  Speech Language Pathology Treatment  Patient Details  Name: Jack Harris MRN: [id number] Date of Birth: [birth year] Referring Provider: Dr. Tami Ribas  Encounter Date: 08/28/2016      End of Session - 08/29/16 1219    Visit Number 19   Number of Visits 24   Date for SLP Re-Evaluation 09/23/16   SLP Start Time 43   SLP Stop Time  1600   SLP Time Calculation (min) 50 min      Past Medical History:  Diagnosis Date  . AICD (automatic cardioverter/defibrillator) present    MEDTRONIC  . Aortic insufficiency   . Asthma    childhood asthma  . Atrial fibrillation ([place])   . Cancer ([place])    bladder, 10 yrs ago  . Cancer (HCC)    squamous cell carcinoma  . Cardiomyopathy ([place])   . CHF (congestive heart failure) ([place])   . Hyperlipidemia   . Hypothyroidism   . Paralysis of vocal cords and larynx, unilateral   . Shortness of breath dyspnea     Past Surgical History:  Procedure Laterality Date  . BLADDER SURGERY     Surgeon:Dr. Bernardo Heater, Location [place]   . [place]  . EYE SURGERY Bilateral    Cataract Extraction with IOL  . THYROPLASTY Left 03/02/2016   Procedure: THYROPLASTY;  Surgeon: Beverly Gust, MD;  Location: [facility];  Service: ENT;  Laterality: Left;    There were no vitals filed for this visit.      Subjective Assessment - 08/29/16 1219    Subjective The patient agrees that his voice is much improved but he would like a lower pitch and more clarity   Currently in Pain? No/denies               ADULT SLP TREATMENT - 08/29/16 0001      General Information   Behavior/Cognition Alert;Cooperative;Pleasant mood     Treatment Provided   Treatment provided Cognitive-Linquistic     Pain Assessment   Pain Assessment No/denies pain     Cognitive-Linquistic  Treatment   Treatment focused on Voice   Skilled Treatment The patient was provided with written and verbal teaching for supplement vocal tract relaxation exercises (tongue trills).  Patient was provided with written and verbal teaching regarding breath support exercises.  Continues with loudness practice using voiceless fricatives (s,f,th).  Overall, the patient demonstrates improving strength of abdominal breathing and breath support, as well as awareness of poor breathing patterns.  He benefited from re-education of abdominal breathing.  Patient instructed in relaxed phonation / oral resonance.  Patient instructed in resonant voice therapy techniques with success using s/z contrast to maintain oral focus.  Patient was able to generate improved oral focus on initial /z/ words after practice of /z/ in isolation. Patient able to generate improved tone focus with flow phonation and into initial /w/ words with 80% accuracy.  Improved generalization by saying /w/ words with clear vocal quality (albeit muffled and high pitched) and able to maintain improved quality and loudness for 20 minutes conversation.     Assessment / Recommendations / Plan   Plan Continue with current plan of care     Progression Toward Goals   Progression toward goals Progressing toward goals          SLP Education - 08/29/16 1219  Education provided Yes   Education Details Susatined vowels and unvoiced fricative to improve breath support   Person(s) Educated Patient   Methods Explanation   Comprehension Verbalized understanding            SLP Long Term Goals - 08/24/16 0945      SLP LONG TERM GOAL #2   Title The patient will be independent for abdominal breathing and breath support exercises.   Time 4   Period Weeks   Status Partially Met     SLP LONG TERM GOAL #3   Title The patient will minimize vocal tension via Yawn-Sigh approach (or comparable technique) with min SLP cues with 80% accuracy.   Time 4    Period Weeks   Status Partially Met     SLP LONG TERM GOAL #4   Title The patient will maintain relaxed phonation / oral resonance for paragraph length recitation with 80% accuracy.   Time 4   Period Weeks   Status Partially Met          Plan - 08/29/16 1220    Clinical Impression Statement Patient was able to generate improved vocal quality using s/z contrast and initial /w/ words to maintain clear vocal quality (albeit muffled and high pitched).  The patient was instructed to go through his exercises without trying to repair errors (he tends to simply practice the wrong technique rather than improve).  The patient was given guidance for pitch glide exercise.  The patient continues to have difficulty with maintaining the clarity when lowering pitch.  Although the patient reports improved voice for his daily communication needs, he has not met his goals and would like to continue speech/voice therapy at this time.   Speech Therapy Frequency 2x / week   Duration 4 weeks   Treatment/Interventions Patient/family education;SLP instruction and feedback;Other (comment)   Potential to Achieve Goals Good   SLP Home Exercise Plan practice breath support, relaxation, descending pitch glides with humming, increasing loudness with humming, functional phrases, chant talk, neck stretches, straw phonation. Use good vocal quality in conversation! Posture exercise   Consulted and Agree with Plan of Care Patient      Patient will benefit from skilled therapeutic intervention in order to improve the following deficits and impairments:   Dysphonia    Problem List There are no active problems to display for this patient.  [name], MS/CCC- SLP  Lou Miner 08/29/2016, 12:21 PM  [place] MAIN [facility] SERVICES [address] [place], [place], [address] Phone: [phone]   Fax:  [phone]   Name: Jack Harris MRN: [id number] Date of Birth:  1933/12/26

## 2016-09-01 ENCOUNTER — Ambulatory Visit: Payer: Medicare Other | Admitting: Speech Pathology

## 2016-09-05 ENCOUNTER — Ambulatory Visit: Payer: Medicare Other | Admitting: Speech Pathology

## 2016-09-05 ENCOUNTER — Encounter: Payer: Self-pay | Admitting: Speech Pathology

## 2016-09-05 DIAGNOSIS — R490 Dysphonia: Secondary | ICD-10-CM

## 2016-09-05 NOTE — Therapy (Signed)
[place] MAIN [facility] SERVICES [address] [place], [place], [address] Phone: [phone]   Fax:  [phone]  Speech Language Pathology Treatment/Progress Note  Patient Details  Name: Jack Harris MRN: [id number] Date of Birth: January 20, 1934 Referring Provider: Dr. Tami Ribas  Encounter Date: 09/05/2016      End of Session - 09/05/16 1056    Visit Number 20   Number of Visits 24   Date for SLP Re-Evaluation 09/23/16   SLP Start Time 1000   SLP Stop Time  8938   SLP Time Calculation (min) 45 min      Past Medical History:  Diagnosis Date  . AICD (automatic cardioverter/defibrillator) present    MEDTRONIC  . Aortic insufficiency   . Asthma    childhood asthma  . Atrial fibrillation ([place])   . Cancer ([place])    bladder, 10 yrs ago  . Cancer (HCC)    squamous cell carcinoma  . Cardiomyopathy ([place])   . CHF (congestive heart failure) ([place])   . Hyperlipidemia   . Hypothyroidism   . Paralysis of vocal cords and larynx, unilateral   . Shortness of breath dyspnea     Past Surgical History:  Procedure Laterality Date  . BLADDER SURGERY     Surgeon:Dr. Bernardo Heater, Location [place]   . [place]  . EYE SURGERY Bilateral    Cataract Extraction with IOL  . THYROPLASTY Left 03/02/2016   Procedure: THYROPLASTY;  Surgeon: Beverly Gust, MD;  Location: [facility] ORS;  Service: ENT;  Laterality: Left;    There were no vitals filed for this visit.      Subjective Assessment - 09/05/16 1056    Subjective The patient agrees that his voice is much improved but he would like a lower pitch and more clarity   Currently in Pain? No/denies               ADULT SLP TREATMENT - 09/05/16 0001      General Information   Behavior/Cognition Alert;Cooperative;Pleasant mood     Treatment Provided   Treatment provided Cognitive-Linquistic     Pain Assessment   Pain Assessment No/denies pain      Cognitive-Linquistic Treatment   Treatment focused on Voice   Skilled Treatment The patient was provided with written and verbal teaching for supplement vocal tract relaxation exercises (tongue trills).  Patient was provided with written and verbal teaching regarding breath support exercises.  Continues with loudness practice using voiceless fricatives (s,f,th).  Overall, the patient demonstrates improving strength of abdominal breathing and breath support, as well as awareness of poor breathing patterns.  He benefited from re-education of abdominal breathing.  Patient instructed in relaxed phonation / oral resonance.  Patient instructed in resonant voice therapy techniques with success using s/z contrast to maintain oral focus.  Patient was able to generate improved oral focus on initial /z/ words after practice of /z/ in isolation. Patient able to generate improved tone focus with flow phonation and into initial /w/ words with 80% accuracy.  Patient can typically do these well in Tx, but has difficulty with generating clear vocal quality independently.     Assessment / Recommendations / Plan   Plan Continue with current plan of care     Progression Toward Goals   Progression toward goals Progressing toward goals          SLP Education - 09/05/16 1056    Education provided Yes   Education Details  Wind instrument to improve breath support   Person(s) Educated Patient   Methods Explanation   Comprehension Verbalized understanding            SLP Long Term Goals - 08/24/16 0945      SLP LONG TERM GOAL #2   Title The patient will be independent for abdominal breathing and breath support exercises.   Time 4   Period Weeks   Status Partially Met     SLP LONG TERM GOAL #3   Title The patient will minimize vocal tension via Yawn-Sigh approach (or comparable technique) with min SLP cues with 80% accuracy.   Time 4   Period Weeks   Status Partially Met     SLP LONG TERM GOAL #4    Title The patient will maintain relaxed phonation / oral resonance for paragraph length recitation with 80% accuracy.   Time 4   Period Weeks   Status Partially Met          Plan - 20-Sep-2016 1057    Clinical Impression Statement Patient was able to generate improved vocal quality using s/z contrast and initial /w/ words to maintain clear vocal quality (albeit muffled and high pitched).  The patient was instructed to go through his exercises without trying to repair errors (he tends to simply practice the wrong technique rather than improve).   The patient tends to "over-think" when practicing independently and cannot release tension sufficiently to generate more clear voice.  Patient instructed to warm up with straw phonation.  Then sing his own songs, trying to use muscle memory to return to his usual phonation.   Speech Therapy Frequency 2x / week   Duration 4 weeks   Treatment/Interventions Patient/family education;SLP instruction and feedback;Other (comment)   Potential to Achieve Goals Good   Potential Considerations Ability to learn/carryover information;Co-morbidities;Cooperation/participation level;Previous level of function;Severity of impairments;Family/community support   SLP Home Exercise Plan practice breath support, relaxation, descending pitch glides with humming, increasing loudness with humming, functional phrases, chant talk, neck stretches, straw phonation. Use good vocal quality in conversation! Posture exercise   Consulted and Agree with Plan of Care Patient      Patient will benefit from skilled therapeutic intervention in order to improve the following deficits and impairments:   Dysphonia      G-Codes - 20-Sep-2016 1057    Functional Assessment Tool Used clinical judgment   Functional Limitations Voice   Voice Current Status (G9171) At least 40 percent but less than 60 percent impaired, limited or restricted   Voice Goal Status (G9172) At least 20 percent but less  than 40 percent impaired, limited or restricted      Problem List There are no active problems to display for this patient.  [name], MS/CCC- SLP  Lou Miner 09-20-16, 10:58 AM  [place] MAIN [facility] SERVICES [address] [place], [place], [address] Phone: [phone]   Fax:  [phone]   Name: Jack Harris MRN: [id number] Date of Birth: [birth year]

## 2016-09-07 ENCOUNTER — Ambulatory Visit: Payer: Medicare Other | Admitting: Speech Pathology

## 2016-09-07 ENCOUNTER — Encounter: Payer: Self-pay | Admitting: Speech Pathology

## 2016-09-07 DIAGNOSIS — R490 Dysphonia: Secondary | ICD-10-CM

## 2016-09-07 NOTE — Therapy (Signed)
[place] MAIN [facility] SERVICES [address] [place], [place], [address] Phone: [phone]   Fax:  [phone]  Speech Language Pathology Treatment  Patient Details  Name: Jack Harris MRN: [id number] Date of Birth: [birth year] Referring Provider: Dr. Tami Ribas  Encounter Date: 09/07/2016      End of Session - 09/07/16 1309    Visit Number 21   Number of Visits 24   Date for SLP Re-Evaluation 09/23/16   SLP Start Time 1015   SLP Stop Time  1100   SLP Time Calculation (min) 45 min      Past Medical History:  Diagnosis Date  . AICD (automatic cardioverter/defibrillator) present    MEDTRONIC  . Aortic insufficiency   . Asthma    childhood asthma  . Atrial fibrillation ([place])   . Cancer ([place])    bladder, 10 yrs ago  . Cancer (HCC)    squamous cell carcinoma  . Cardiomyopathy ([place])   . CHF (congestive heart failure) ([place])   . Hyperlipidemia   . Hypothyroidism   . Paralysis of vocal cords and larynx, unilateral   . Shortness of breath dyspnea     Past Surgical History:  Procedure Laterality Date  . BLADDER SURGERY     Surgeon:Dr. Bernardo Heater, Location [place]   . [place]  . EYE SURGERY Bilateral    Cataract Extraction with IOL  . THYROPLASTY Left 03/02/2016   Procedure: THYROPLASTY;  Surgeon: Beverly Gust, MD;  Location: [facility] ORS;  Service: ENT;  Laterality: Left;    There were no vitals filed for this visit.      Subjective Assessment - 09/07/16 1309    Subjective The patient agrees that his voice is much improved but he would like a lower pitch and more clarity   Currently in Pain? No/denies               ADULT SLP TREATMENT - 09/07/16 0001      General Information   Behavior/Cognition Alert;Cooperative;Pleasant mood     Treatment Provided   Treatment provided Cognitive-Linquistic     Pain Assessment   Pain Assessment No/denies pain     Cognitive-Linquistic  Treatment   Treatment focused on Voice   Skilled Treatment The patient was instructed in gentle laryngeal massage and focus on lowered larynx when completing a swallow.  He is instructed to find the lowest laryngeal position possible and generate voice (hum).  This successfully generates a lower pitched voice.  In the past we've tried: The patient was provided with written and verbal teaching for supplement vocal tract relaxation exercises (tongue trills).  Patient was provided with written and verbal teaching regarding breath support exercises.  Continues with loudness practice using voiceless fricatives (s,f,th).  Overall, the patient demonstrates improving strength of abdominal breathing and breath support, as well as awareness of poor breathing patterns.  He benefited from re-education of abdominal breathing.  Patient instructed in relaxed phonation / oral resonance.  Patient instructed in resonant voice therapy techniques with success using s/z contrast to maintain oral focus.  Patient was able to generate improved oral focus on initial /z/ words after practice of /z/ in isolation. Patient able to generate improved tone focus with flow phonation and into initial /w/ words with 80% accuracy.  Patient can typically do these well in Tx, but has difficulty with generating clear vocal quality independently.     Assessment / Recommendations / Plan   Plan  Continue with current plan of care     Progression Toward Goals   Progression toward goals Progressing toward goals          SLP Education - 09/07/16 1309    Education provided Yes   Education Details Gentle laryngeal massage to lower larynx   Person(s) Educated Patient   Methods Explanation   Comprehension Verbalized understanding            SLP Long Term Goals - 08/24/16 0945      SLP LONG TERM GOAL #2   Title The patient will be independent for abdominal breathing and breath support exercises.   Time 4   Period Weeks   Status  Partially Met     SLP LONG TERM GOAL #3   Title The patient will minimize vocal tension via Yawn-Sigh approach (or comparable technique) with min SLP cues with 80% accuracy.   Time 4   Period Weeks   Status Partially Met     SLP LONG TERM GOAL #4   Title The patient will maintain relaxed phonation / oral resonance for paragraph length recitation with 80% accuracy.   Time 4   Period Weeks   Status Partially Met          Plan - 09/07/16 1309    Clinical Impression Statement Patient was able to generate a lower pitch when instructed in gentle laryngeal massage coupled with finding lowest laryngeal position after completing a swallow.   Speech Therapy Frequency 2x / week   Duration 4 weeks   Treatment/Interventions Patient/family education;SLP instruction and feedback;Other (comment)   Potential to Achieve Goals Good   Potential Considerations Ability to learn/carryover information;Co-morbidities;Cooperation/participation level;Previous level of function;Severity of impairments;Family/community support   SLP Home Exercise Plan practice breath support, relaxation, descending pitch glides with humming, increasing loudness with humming, functional phrases, chant talk, neck stretches, straw phonation. Use good vocal quality in conversation! Posture exercise. Gentle laryngeal massage.    Consulted and Agree with Plan of Care Patient      Patient will benefit from skilled therapeutic intervention in order to improve the following deficits and impairments:   Dysphonia    Problem List There are no active problems to display for this patient.  [name], MS/CCC- SLP  Lou Miner 09/07/2016, 1:12 PM  [place] MAIN [facility] SERVICES [address] [place], [place], [address] Phone: [phone]   Fax:  [phone]   Name: Jack Harris MRN: [id number] Date of Birth: [birth year]

## 2016-09-15 ENCOUNTER — Ambulatory Visit: Payer: 59 | Admitting: Speech Pathology

## 2016-09-19 ENCOUNTER — Encounter: Payer: Self-pay | Admitting: Speech Pathology

## 2016-09-19 ENCOUNTER — Ambulatory Visit: Payer: 59 | Attending: Unknown Physician Specialty | Admitting: Speech Pathology

## 2016-09-19 DIAGNOSIS — R490 Dysphonia: Secondary | ICD-10-CM | POA: Insufficient documentation

## 2016-09-19 NOTE — Therapy (Signed)
[place] MAIN [facility] SERVICES [address] [place], [place], [address] Phone: [phone]   Fax:  [phone]  Speech Language Pathology Treatment  Patient Details  Name: Jack Harris MRN: [id number] Date of Birth: November 20, 1933 Referring Provider: Dr. Tami Ribas  Encounter Date: 09/19/2016      End of Session - 09/19/16 0959    Visit Number 22   Number of Visits 24   Date for SLP Re-Evaluation 09/23/16   SLP Start Time 0900   SLP Stop Time  0950   SLP Time Calculation (min) 50 min   Activity Tolerance Patient tolerated treatment well      Past Medical History:  Diagnosis Date  . AICD (automatic cardioverter/defibrillator) present    MEDTRONIC  . Aortic insufficiency   . Asthma    childhood asthma  . Atrial fibrillation ([place])   . Cancer ([place])    bladder, 10 yrs ago  . Cancer (HCC)    squamous cell carcinoma  . Cardiomyopathy ([place])   . CHF (congestive heart failure) ([place])   . Hyperlipidemia   . Hypothyroidism   . Paralysis of vocal cords and larynx, unilateral   . Shortness of breath dyspnea     Past Surgical History:  Procedure Laterality Date  . BLADDER SURGERY     Surgeon:Dr. Bernardo Heater, Location [place]   . [place]  . EYE SURGERY Bilateral    Cataract Extraction with IOL  . THYROPLASTY Left 03/02/2016   Procedure: THYROPLASTY;  Surgeon: Beverly Gust, MD;  Location: [facility] OR[facility];  Service: ENT;  Laterality: Left;    There were no vitals filed for this visit.      Subjective Assessment - 09/19/16 0958    Subjective The patient agrees that his voice is much improved but he would like a lower pitch and more clarity   Currently in Pain? No/denies               ADULT SLP TREATMENT - 09/19/16 0001      General Information   Behavior/Cognition Alert;Cooperative;Pleasant mood     Treatment Provided   Treatment provided Cognitive-Linquistic     Pain Assessment   Pain  Assessment No/denies pain     Cognitive-Linquistic Treatment   Treatment focused on Voice   Skilled Treatment The patient was instructed in gentle laryngeal massage and focus on lowered larynx when completing a swallow.  He is instructed to find the lowest laryngeal position possible and generate voice (hum).  This successfully generates a lower pitched voice.  The patient was able to maintain a lowered and louder voice to answer miscellaneous questions.  Vocal quality was hoarse, but intelligible.  The patient is instructed to practice projecting a louder and lower voice in conversation.  In the past we've tried: The patient was provided with written and verbal teaching for supplement vocal tract relaxation exercises (tongue trills).  Patient was provided with written and verbal teaching regarding breath support exercises.  Continues with loudness practice using voiceless fricatives (s,f,th).  Overall, the patient demonstrates improving strength of abdominal breathing and breath support, as well as awareness of poor breathing patterns.  He benefited from re-education of abdominal breathing.  Patient instructed in relaxed phonation / oral resonance.  Patient instructed in resonant voice therapy techniques with success using s/z contrast to maintain oral focus.  Patient was able to generate improved oral focus on initial /z/ words after practice of /z/ in isolation. Patient able  to generate improved tone focus with flow phonation and into initial /w/ words with 80% accuracy.  Patient can typically do these well in Tx, but has difficulty with generating clear vocal quality independently.     Assessment / Recommendations / Plan   Plan Continue with current plan of care     Progression Toward Goals   Progression toward goals Progressing toward goals          SLP Education - 09/19/16 0959    Education provided Yes   Education Details Lower pitch and project voice   Person(s) Educated Patient   Methods  Explanation   Comprehension Verbalized understanding            SLP Long Term Goals - 08/24/16 0945      SLP LONG TERM GOAL #2   Title The patient will be independent for abdominal breathing and breath support exercises.   Time 4   Period Weeks   Status Partially Met     SLP LONG TERM GOAL #3   Title The patient will minimize vocal tension via Yawn-Sigh approach (or comparable technique) with min SLP cues with 80% accuracy.   Time 4   Period Weeks   Status Partially Met     SLP LONG TERM GOAL #4   Title The patient will maintain relaxed phonation / oral resonance for paragraph length recitation with 80% accuracy.   Time 4   Period Weeks   Status Partially Met          Plan - 09/19/16 0959    Clinical Impression Statement Patient was able to generate a lower pitch when instructed in gentle laryngeal massage coupled with finding lowest laryngeal position after completing a swallow.  He is able to maintain both lower pitch and louder voice, albeit hoarse.   Speech Therapy Frequency 2x / week   Duration 4 weeks   Treatment/Interventions Patient/family education;SLP instruction and feedback;Other (comment)  Voice therapy   Potential Considerations Ability to learn/carryover information;Co-morbidities;Cooperation/participation level;Previous level of function;Severity of impairments;Family/community support   SLP Home Exercise Plan practice breath support, relaxation, descending pitch glides with humming, increasing loudness with humming, functional phrases, chant talk, neck stretches, straw phonation. Use good vocal quality in conversation! Posture exercise. Gentle laryngeal massage.    Consulted and Agree with Plan of Care Patient      Patient will benefit from skilled therapeutic intervention in order to improve the following deficits and impairments:   Dysphonia    Problem List There are no active problems to display for this patient.  [name], [place], Susie 09/19/2016, 10:00 AM  [place] MAIN [facility] SERVICES [address] [place], [place], [address] Phone: [phone]   Fax:  [phone]   Name: KILIAN SCHWARTZ MRN: [id number] Date of Birth: 1934-05-14

## 2016-09-22 ENCOUNTER — Encounter: Payer: Self-pay | Admitting: Speech Pathology

## 2016-09-22 ENCOUNTER — Ambulatory Visit: Payer: 59 | Admitting: Speech Pathology

## 2016-09-22 DIAGNOSIS — R490 Dysphonia: Secondary | ICD-10-CM

## 2016-09-22 NOTE — Therapy (Signed)
[place] MAIN [facility] SERVICES [address] [place], [place], [address] Phone: [phone]   Fax:  [phone]  Speech Language Pathology Treatment/Discharge Summary  Patient Details  Name: Jack Harris MRN: [id number] Date of Birth: Jan 10, 1934 Referring Provider: Dr. Tami Ribas  Encounter Date: 09/22/2016      End of Session - 09/22/16 1602    Visit Number 23   Number of Visits 24   Date for SLP Re-Evaluation 09/23/16   SLP Start Time 1345   SLP Stop Time  1445   SLP Time Calculation (min) 60 min   Activity Tolerance Patient tolerated treatment well      Past Medical History:  Diagnosis Date  . AICD (automatic cardioverter/defibrillator) present    [employer]  . Aortic insufficiency   . Asthma    childhood asthma  . Atrial fibrillation ([place])   . Cancer ([place])    bladder, 10 yrs ago  . Cancer (H[facility])    squamous cell carcinoma  . Cardiomyopathy ([place])   . CHF (congestive heart failure) ([place])   . Hyperlipidemia   . Hypothyroidism   . Paralysis of vocal cords and larynx, unilateral   . Shortness of breath dyspnea     Past Surgical History:  Procedure Laterality Date  . BLADDER SURGERY     Surgeon:Dr. Bernardo Heater, Location [place]   . [place]  . EYE SURGERY Bilateral    Cataract Extraction with IOL  . THYROPLASTY Left 03/02/2016   Procedure: THYROPLASTY;  Surgeon: Beverly Gust, MD;  Location: [facility] ORS;  Service: ENT;  Laterality: Left;    There were no vitals filed for this visit.      Subjective Assessment - 09/22/16 1600    Subjective The patient states he is happy with his current voice- it is intelligible and low enough in pitch   Currently in Pain? No/denies               ADULT SLP TREATMENT - 09/22/16 0001      General Information   Behavior/Cognition Alert;Cooperative;Pleasant mood     Treatment Provided   Treatment provided Cognitive-Linquistic     Pain  Assessment   Pain Assessment No/denies pain     Cognitive-Linquistic Treatment   Treatment focused on Voice   Skilled Treatment The patient was instructed in gentle laryngeal massage and focus on lowered larynx when completing a swallow.  He is instructed to find the lowest laryngeal position possible and generate voice (hum).  This successfully generates a lower pitched voice.  The patient was able to maintain a lowered and louder voice to participate in conversation.  Vocal quality was hoarse, but intelligible.  The patient is instructed to practice projecting a louder and lower voice in conversation.  In the past we've tried: The patient was provided with written and verbal teaching for supplement vocal tract relaxation exercises (tongue trills).  Patient was provided with written and verbal teaching regarding breath support exercises.  Continues with loudness practice using voiceless fricatives (s,f,th).  Overall, the patient demonstrates improving strength of abdominal breathing and breath support, as well as awareness of poor breathing patterns.  He benefited from re-education of abdominal breathing.  Patient instructed in relaxed phonation / oral resonance.  Patient instructed in resonant voice therapy techniques with success using s/z contrast to maintain oral focus.  Patient was able to generate improved oral focus on initial /z/ words after practice of /z/ in isolation. Patient able  to generate improved tone focus with flow phonation and into initial /w/ words with 80% accuracy.  Patient can typically do these well in [place], but has difficulty with generating clear vocal quality independently.     Assessment / Recommendations / Plan   Plan Discharge SLP treatment due to (comment)     Progression Toward Goals   Progression toward goals Goals met, education completed, patient discharged from SLP          SLP Education - 2016-10-10 1601    Education provided Yes   Education Details Laryngeal  massage and stretching extrinsic laryngeal muscles   Person(s) Educated Patient   Methods Explanation   Comprehension Verbalized understanding            SLP Long Term Goals - 2016-10-10 1605      SLP LONG TERM GOAL #1   Title The patient will demonstrate independent understanding of vocal hygiene concepts and neck, shoulder, lingual stretching exercises.   Status Achieved     SLP LONG TERM GOAL #2   Title The patient will be independent for abdominal breathing and breath support exercises.   Status Achieved     SLP LONG TERM GOAL #3   Title The patient will minimize vocal tension via Yawn-Sigh approach (or comparable technique) with min SLP cues with 80% accuracy.   Time 4   Period Weeks   Status Partially Met     SLP LONG TERM GOAL #4   Title The patient will maintain relaxed phonation / oral resonance for paragraph length recitation with 80% accuracy.   Time 4   Period Weeks   Status Partially Met          Plan - 10/10/2016 1602    Clinical Impression Statement Patient is able to generate a lower pitch when instructed in gentle laryngeal massage coupled with finding lowest laryngeal position after completing a swallow.  He is able to maintain both lower pitch and louder voice, albeit hoarse.  He is satisfied with this voice and is ready to discontinue speech therapy at this time.     Speech Therapy Frequency Other (comment)  Discharge   Treatment/Interventions Patient/family education;SLP instruction and feedback;Other (comment)  Voice therapy   Potential to Achieve Goals Good   Potential Considerations Ability to learn/carryover information;Co-morbidities;Cooperation/participation level;Previous level of function;Severity of impairments;Family/community support   SLP Home Exercise Plan Laryngeal massage, stretch extrinsic laryngeal muscles, speak and project at lowered pitch, practice breath support, relaxation,  Posture exercise.    Consulted and Agree with Plan of Care  Patient      Patient will benefit from skilled therapeutic intervention in order to improve the following deficits and impairments:   Dysphonia      G-Codes - 2016/10/10 1606    Functional Assessment Tool Used clinical judgment   Functional Limitations Voice   Voice Current Status (G9171) At least 40 percent but less than 60 percent impaired, limited or restricted   Voice Goal Status (G9172) At least 20 percent but less than 40 percent impaired, limited or restricted   Voice Discharge Status (T3428) At least 40 percent but less than 60 percent impaired, limited or restricted      Problem List There are no active problems to display for this patient.  [name], MS/CCC- SLP  Lou Miner 10-10-2016, 4:07 PM  [place] MAIN [facility] SERVICES [address] [place], [place], [address] Phone: [phone]   Fax:  [phone]   Name: Jack Harris MRN: [id number] Date of Birth: [birth year]

## 2017-10-03 IMAGING — CT CT CHEST W/ CM
1 series · 15 of 34 positions shown, 19 images · IV contrast (iopamidol)
Comparison: None.

CLINICAL DATA: Vocal cord paralysis.

EXAM:
CT CHEST WITH CONTRAST
TECHNIQUE: Multidetector CT imaging of the chest was performed during
intravenous contrast administration.
CONTRAST:  60mL [id number] IOPAMIDOL ([id number]) INJECTION 61%

[Series 2: axial st · axial · 0.69mm/px · z∈[-542,-228]mm · 15 of 185 slices shown, 19 images]
[im 14/185  mediastinal]
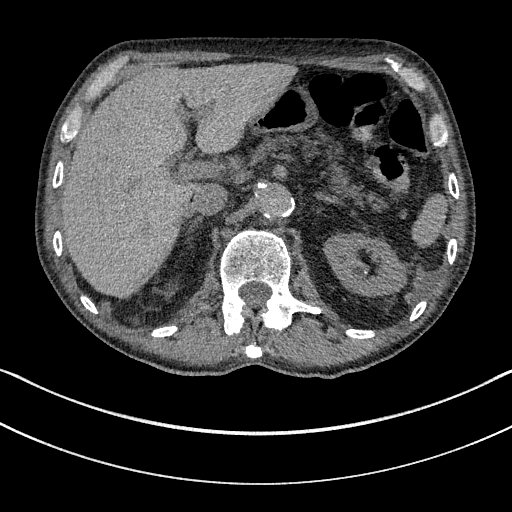
[im 14/185  lung]
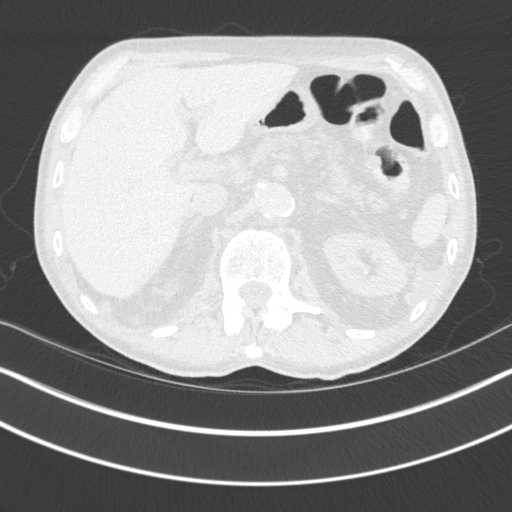
[im 28/185  lung]
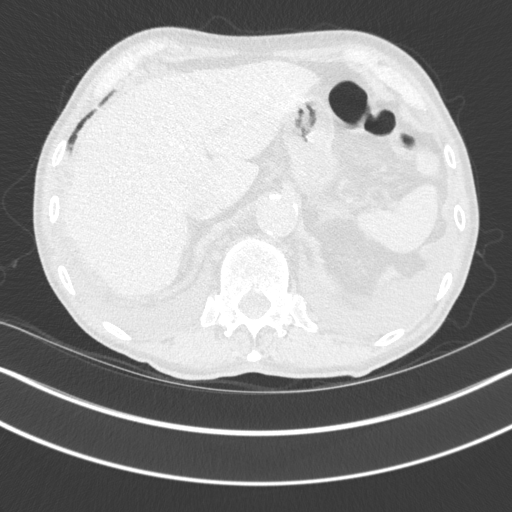
[im 37/185  lung]
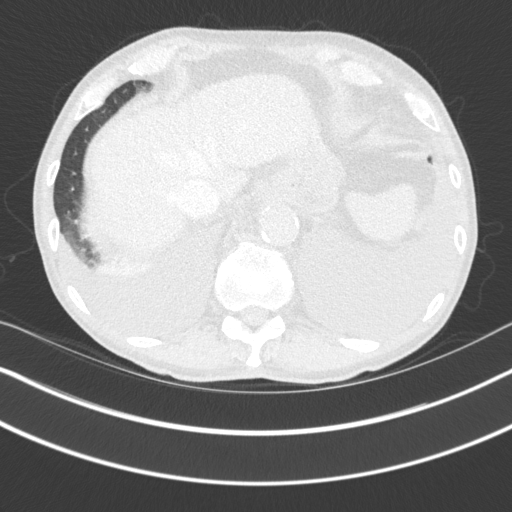
[im 48/185  lung]
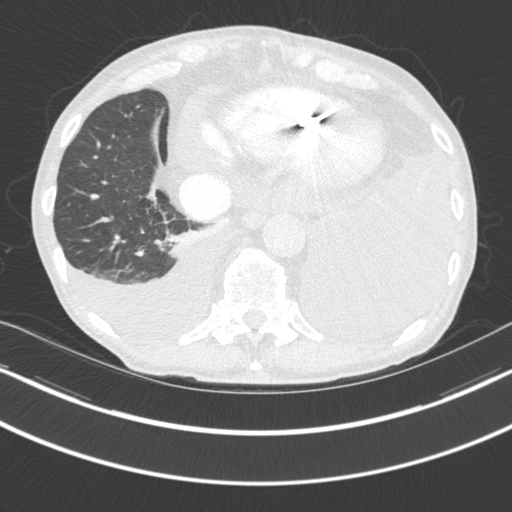
[im 62/185  mediastinal]
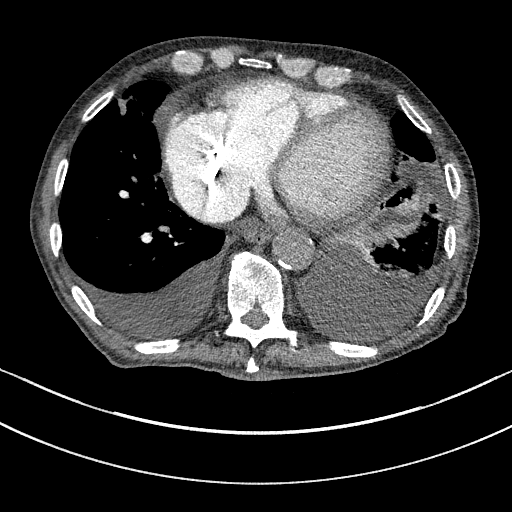
[im 62/185  lung]
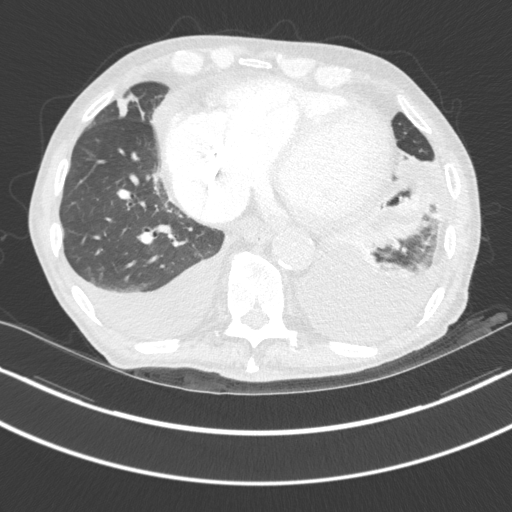
[im 74/185  lung]
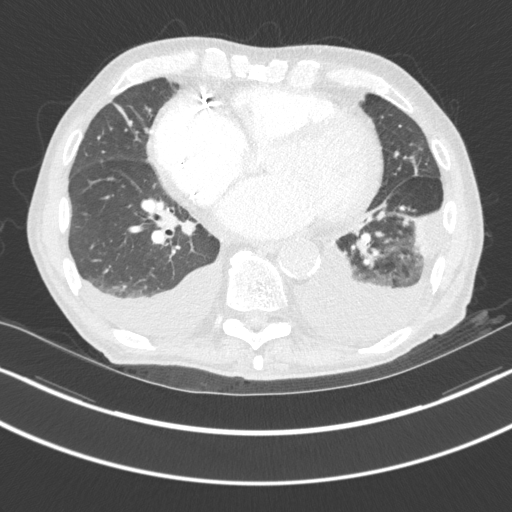
[im 82/185  lung]
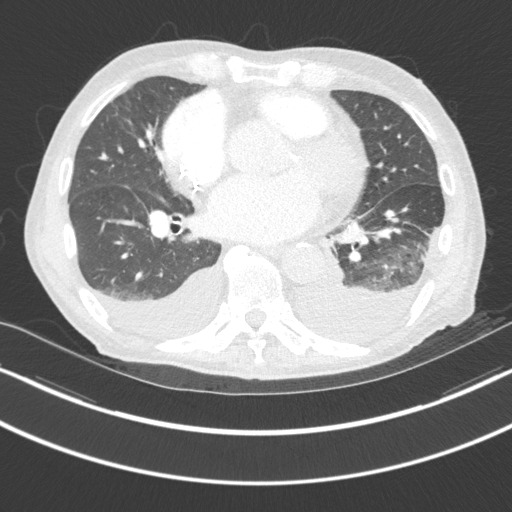
[im 96/185  lung]
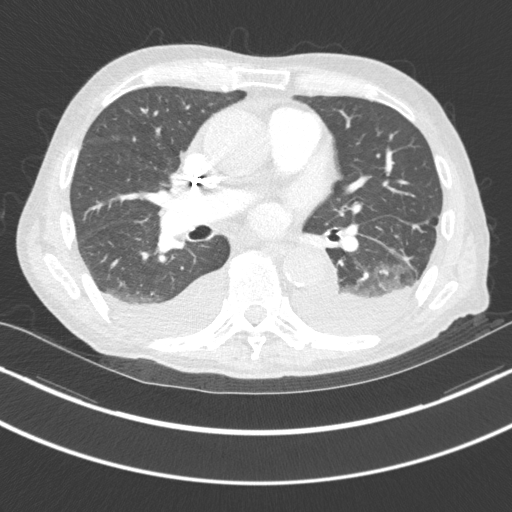
[im 103/185  mediastinal]
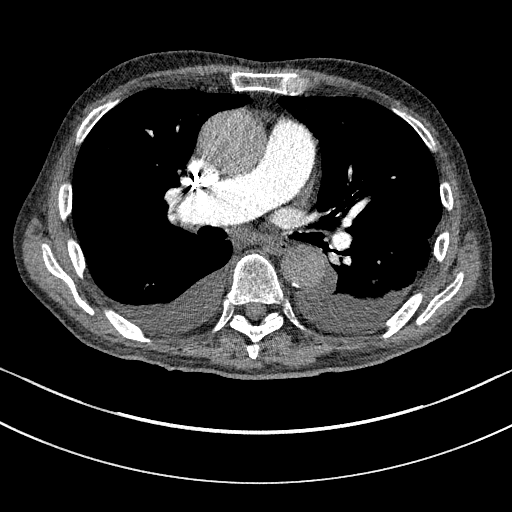
[im 103/185  lung]
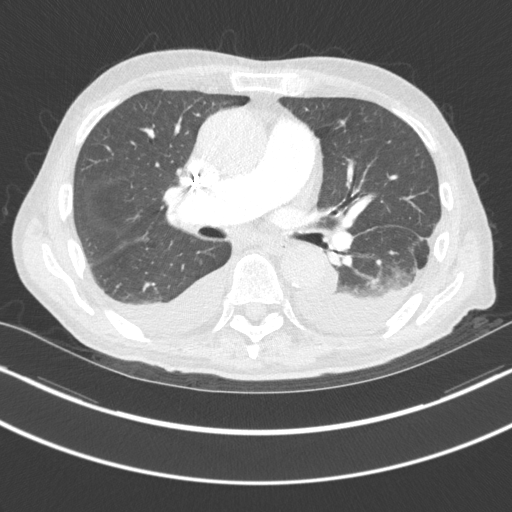
[im 111/185  lung]
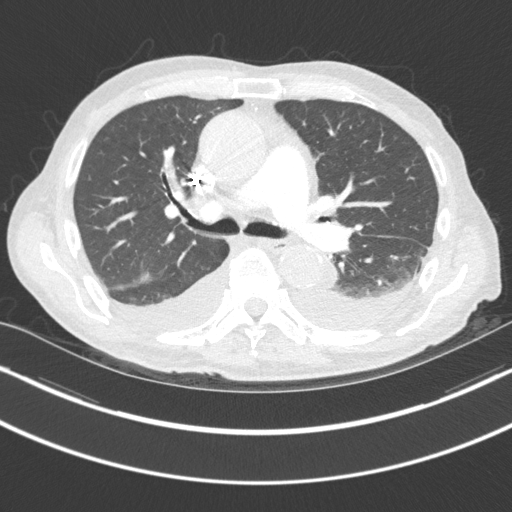
[im 123/185  lung]
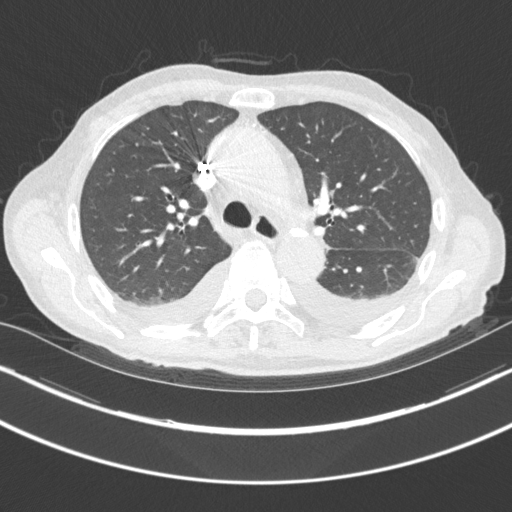
[im 137/185  lung]
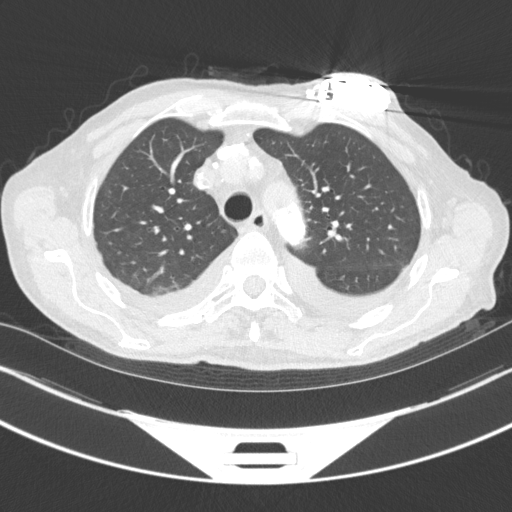
[im 148/185  mediastinal]
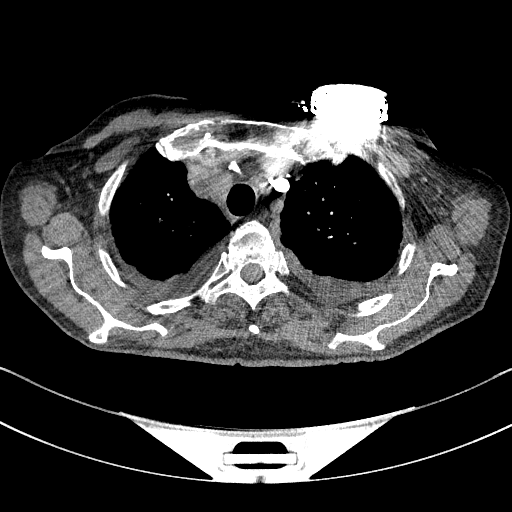
[im 148/185  lung]
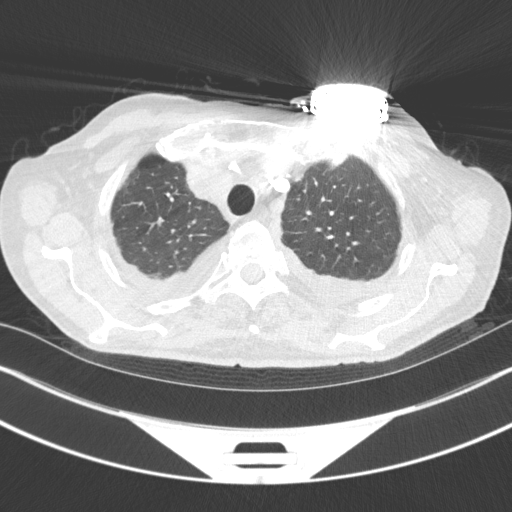
[im 157/185  lung]
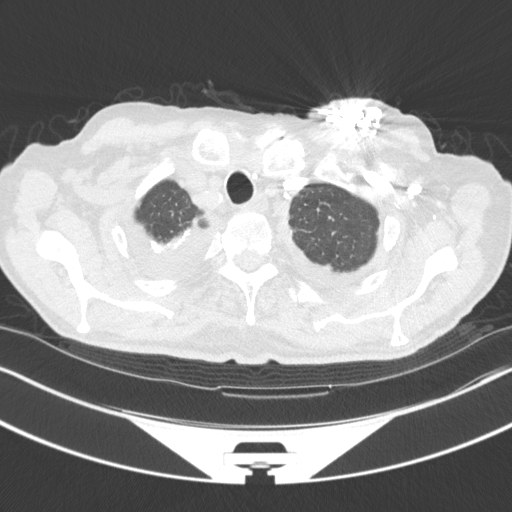
[im 171/185  lung]
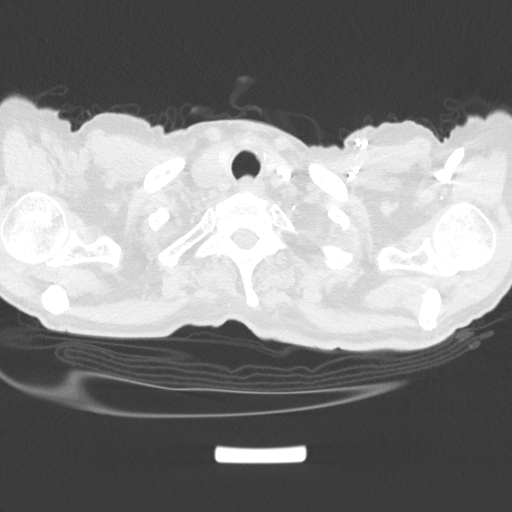

[15 of 34 positions shown; findings below may reference images not displayed]

FINDINGS: Mediastinum: Moderate cardiac enlargement. There is a left chest
wall ICD with leads in the right atrial appendage and right
ventricle. Aortic atherosclerosis is noted. The ascending thoracic
aorta is increased in caliber measuring 4.4 cm, image 78 of series
2. Calcification within the LAD coronary artery is noted. The
trachea appears patent and is midline. Normal appearance of the
esophagus. No mediastinal or hilar adenopathy identified.

Lungs/Pleura: There are moderate bilateral pleural effusions present
left greater than right. Mild interstitial edema is identified.
Compressive type atelectasis is noted within the left lower lobe.
Subsegmental atelectasis in the lingula and right middle lobe is
noted.

Upper Abdomen: There is contrast material within the IVC and
intrahepatic veins compatible with passive venous congestion. No
focal liver abnormality noted. The spleen appears normal. The normal
appearance of the adrenal glands. There is a stone within the upper
pole of the right kidney which measures 4 mm, image 183 of series 2.
Cyst within the upper pole of the left kidney is noted.

Musculoskeletal: Spondylosis present within the thoracic spine. No
aggressive lytic or sclerotic bone lesions.
IMPRESSION: 1. No evidence for hilar or mediastinal mass.
2. Cardiac enlargement, aortic atherosclerosis and LAD coronary
artery calcification.
3. Bilateral pleural effusions favored to represent sequelae of CHF.
4. Right renal calculus.
5.

## 2019-04-18 ENCOUNTER — Other Ambulatory Visit
Admission: RE | Admit: 2019-04-18 | Discharge: 2019-04-18 | Disposition: A | Discharge status: Home / Self Care | Payer: Medicare Other | Source: Ambulatory Visit | Attending: Cardiology | Admitting: Cardiology

## 2019-04-18 ENCOUNTER — Other Ambulatory Visit: Payer: Self-pay

## 2019-04-18 DIAGNOSIS — Z20828 Contact with and (suspected) exposure to other viral communicable diseases: Secondary | ICD-10-CM | POA: Insufficient documentation

## 2019-04-18 DIAGNOSIS — Z01812 Encounter for preprocedural laboratory examination: Secondary | ICD-10-CM | POA: Insufficient documentation

## 2019-04-19 LAB — SARS CORONAVIRUS 2 (TAT 6-24 HRS): SARS Coronavirus 2: NEGATIVE

## 2019-04-23 ENCOUNTER — Other Ambulatory Visit: Payer: Self-pay

## 2019-04-23 ENCOUNTER — Ambulatory Visit: Payer: Medicare Other | Admitting: Anesthesiology

## 2019-04-23 ENCOUNTER — Encounter
Admission: RE | Disposition: A | Discharge status: Home / Self Care | Payer: Self-pay | Source: Home / Self Care | Attending: Cardiology

## 2019-04-23 ENCOUNTER — Encounter: Payer: Self-pay | Admitting: Anesthesiology

## 2019-04-23 ENCOUNTER — Ambulatory Visit
Admission: RE | Admit: 2019-04-23 | Discharge: 2019-04-23 | Disposition: A | Discharge status: Home / Self Care | Payer: Medicare Other | Attending: Cardiology | Admitting: Cardiology

## 2019-04-23 DIAGNOSIS — E785 Hyperlipidemia, unspecified: Secondary | ICD-10-CM | POA: Diagnosis not present

## 2019-04-23 DIAGNOSIS — E039 Hypothyroidism, unspecified: Secondary | ICD-10-CM | POA: Diagnosis not present

## 2019-04-23 DIAGNOSIS — Z006 Encounter for examination for normal comparison and control in clinical research program: Secondary | ICD-10-CM | POA: Diagnosis not present

## 2019-04-23 DIAGNOSIS — I480 Paroxysmal atrial fibrillation: Secondary | ICD-10-CM | POA: Diagnosis not present

## 2019-04-23 DIAGNOSIS — Z7901 Long term (current) use of anticoagulants: Secondary | ICD-10-CM | POA: Diagnosis not present

## 2019-04-23 DIAGNOSIS — Z79890 Hormone replacement therapy (postmenopausal): Secondary | ICD-10-CM | POA: Diagnosis not present

## 2019-04-23 DIAGNOSIS — I420 Dilated cardiomyopathy: Secondary | ICD-10-CM | POA: Insufficient documentation

## 2019-04-23 DIAGNOSIS — I080 Rheumatic disorders of both mitral and aortic valves: Secondary | ICD-10-CM | POA: Insufficient documentation

## 2019-04-23 DIAGNOSIS — Z888 Allergy status to other drugs, medicaments and biological substances status: Secondary | ICD-10-CM | POA: Insufficient documentation

## 2019-04-23 DIAGNOSIS — Z79899 Other long term (current) drug therapy: Secondary | ICD-10-CM | POA: Insufficient documentation

## 2019-04-23 DIAGNOSIS — Z881 Allergy status to other antibiotic agents status: Secondary | ICD-10-CM | POA: Insufficient documentation

## 2019-04-23 DIAGNOSIS — I5022 Chronic systolic (congestive) heart failure: Secondary | ICD-10-CM | POA: Diagnosis not present

## 2019-04-23 DIAGNOSIS — Z95810 Presence of automatic (implantable) cardiac defibrillator: Secondary | ICD-10-CM | POA: Insufficient documentation

## 2019-04-23 DIAGNOSIS — J45909 Unspecified asthma, uncomplicated: Secondary | ICD-10-CM | POA: Diagnosis not present

## 2019-04-23 DIAGNOSIS — I447 Left bundle-branch block, unspecified: Secondary | ICD-10-CM | POA: Diagnosis not present

## 2019-04-23 DIAGNOSIS — Z4502 Encounter for adjustment and management of automatic implantable cardiac defibrillator: Secondary | ICD-10-CM | POA: Diagnosis present

## 2019-04-23 HISTORY — PX: IMPLANTABLE CARDIOVERTER DEFIBRILLATOR (ICD) GENERATOR CHANGE: SHX5469

## 2019-04-23 SURGERY — ICD GENERATOR CHANGE
Anesthesia: Monitor Anesthesia Care | Site: Chest | Laterality: Left

## 2019-04-23 MED ORDER — CEFAZOLIN SODIUM-DEXTROSE 1-4 GM/50ML-% IV SOLN
1.0000 g | Freq: Once | INTRAVENOUS | Status: AC
  Administered 2019-04-23: 1 g via INTRAVENOUS

## 2019-04-23 MED ORDER — ONDANSETRON HCL 4 MG/2ML IJ SOLN: 4.0000 mg | Freq: Once | INTRAMUSCULAR | Status: DC | PRN

## 2019-04-23 MED ORDER — CEFAZOLIN SODIUM-DEXTROSE 1-4 GM/50ML-% IV SOLN
INTRAVENOUS | Status: AC
  Filled 2019-04-23: qty 50

## 2019-04-23 MED ORDER — PROPOFOL 500 MG/50ML IV EMUL
INTRAVENOUS | Status: DC | PRN
  Administered 2019-04-23: 100 ug/kg/min via INTRAVENOUS

## 2019-04-23 MED ORDER — FENTANYL CITRATE (PF) 100 MCG/2ML IJ SOLN
INTRAMUSCULAR | Status: AC
  Filled 2019-04-23: qty 2

## 2019-04-23 MED ORDER — LIDOCAINE HCL 1 % IJ SOLN
INTRAMUSCULAR | Status: DC | PRN
  Administered 2019-04-23: 20 mL

## 2019-04-23 MED ORDER — FAMOTIDINE 20 MG PO TABS
ORAL_TABLET | ORAL | Status: AC
  Filled 2019-04-23: qty 1

## 2019-04-23 MED ORDER — FAMOTIDINE 20 MG PO TABS
20.0000 mg | ORAL_TABLET | Freq: Once | ORAL | Status: AC
  Administered 2019-04-23: 20 mg via ORAL

## 2019-04-23 MED ORDER — GENTAMICIN SULFATE 40 MG/ML IJ SOLN
Freq: Once | INTRAMUSCULAR | Status: DC
  Filled 2019-04-23: qty 2

## 2019-04-23 MED ORDER — PHENYLEPHRINE HCL (PRESSORS) 10 MG/ML IV SOLN
INTRAVENOUS | Status: DC | PRN
  Administered 2019-04-23 (×2): 100 ug via INTRAVENOUS

## 2019-04-23 MED ORDER — CEPHALEXIN 250 MG PO CAPS: 500.0000 mg | ORAL_CAPSULE | Freq: Two times a day (BID) | ORAL | 0 refills | Status: DC

## 2019-04-23 MED ORDER — ACETAMINOPHEN 325 MG PO TABS: 325.0000 mg | ORAL_TABLET | ORAL | Status: DC | PRN

## 2019-04-23 MED ORDER — LACTATED RINGERS IV SOLN
INTRAVENOUS | Status: DC
  Administered 2019-04-23: 09:00:00 via INTRAVENOUS

## 2019-04-23 MED ORDER — ONDANSETRON HCL 4 MG/2ML IJ SOLN: 4.0000 mg | Freq: Four times a day (QID) | INTRAMUSCULAR | Status: DC | PRN

## 2019-04-23 MED ORDER — FENTANYL CITRATE (PF) 100 MCG/2ML IJ SOLN
INTRAMUSCULAR | Status: DC | PRN
  Administered 2019-04-23: 25 ug via INTRAVENOUS

## 2019-04-23 MED ORDER — FENTANYL CITRATE (PF) 100 MCG/2ML IJ SOLN: 25.0000 ug | INTRAMUSCULAR | Status: DC | PRN

## 2019-04-23 MED ORDER — PROPOFOL 10 MG/ML IV BOLUS
INTRAVENOUS | Status: DC | PRN
  Administered 2019-04-23: 40 mg via INTRAVENOUS

## 2019-04-23 MED ORDER — GENTAMICIN SULFATE 40 MG/ML IJ SOLN
INTRAMUSCULAR | Status: AC
  Filled 2019-04-23: qty 2

## 2019-04-23 SURGICAL SUPPLY — 25 items
APL PRP STRL LF DISP 70% ISPRP (MISCELLANEOUS) ×1
BAG DECANTER FOR FLEXI CONT (MISCELLANEOUS) ×3 IMPLANT
BLADE PHOTON ILLUMINATED (MISCELLANEOUS) ×3 IMPLANT
CANISTER SUCT 1200ML W/VALVE (MISCELLANEOUS) ×3 IMPLANT
CHLORAPREP W/TINT 26 (MISCELLANEOUS) ×3 IMPLANT
COVER LIGHT HANDLE STERIS (MISCELLANEOUS) ×6 IMPLANT
COVER MAYO STAND REUSABLE (DRAPES) ×3 IMPLANT
COVER WAND RF STERILE (DRAPES) ×3 IMPLANT
DRSG TEGADERM 4X4.75 (GAUZE/BANDAGES/DRESSINGS) ×3 IMPLANT
DRSG TELFA 4X3 1S NADH ST (GAUZE/BANDAGES/DRESSINGS) ×3 IMPLANT
ELECT REM PT RETURN 9FT ADLT (ELECTROSURGICAL) ×3
ELECTRODE REM PT RTRN 9FT ADLT (ELECTROSURGICAL) ×1 IMPLANT
GLOVE BIO SURGEON STRL SZ7.5 (GLOVE) ×3 IMPLANT
GLOVE BIO SURGEON STRL SZ8 (GLOVE) ×3 IMPLANT
GOWN STRL REUS W/ TWL LRG LVL3 (GOWN DISPOSABLE) ×1 IMPLANT
GOWN STRL REUS W/ TWL XL LVL3 (GOWN DISPOSABLE) ×1 IMPLANT
GOWN STRL REUS W/TWL LRG LVL3 (GOWN DISPOSABLE) ×3
GOWN STRL REUS W/TWL XL LVL3 (GOWN DISPOSABLE) ×3
ICD EVERA XT MRI DF1  DDMB1D1 (ICD Generator) ×2 IMPLANT
ICD EVERA XT MRI DF1 DDMB1D1 (ICD Generator) IMPLANT
KIT TURNOVER KIT A (KITS) ×3 IMPLANT
MARKER SKIN DUAL TIP RULER LAB (MISCELLANEOUS) ×3 IMPLANT
PACK PACE INSERTION (MISCELLANEOUS) ×3 IMPLANT
PAD STATPAD (MISCELLANEOUS) ×3 IMPLANT
STRAP SAFETY 5IN WIDE (MISCELLANEOUS) ×3 IMPLANT

## 2019-04-23 NOTE — Anesthesia Postprocedure Evaluation (Signed)
Anesthesia Post Note  Patient: Kiondre Grenz [facility]  Procedure(s) Performed: ICD GENERATOR CHANGE (Left Chest)  Patient location during evaluation: PACU Anesthesia Type: MAC Level of consciousness: awake and alert Pain management: pain level controlled Vital Signs Assessment: post-procedure vital signs reviewed and stable Respiratory status: spontaneous breathing, nonlabored ventilation, respiratory function stable and patient connected to nasal cannula oxygen Cardiovascular status: blood pressure returned to baseline and stable Postop Assessment: no apparent nausea or vomiting Anesthetic complications: no     Last Vitals:  Vitals:   04/23/19 1321 04/23/19 1343  BP:  (!) 153/63  Pulse:  (!) 59  Resp:  18  Temp: (!) 36.4 C (!) 35.6 C  SpO2:  100%    Last Pain:  Vitals:   04/23/19 1343  TempSrc:   PainSc: 0-No pain                 Precious Haws Jodye Scali

## 2019-04-23 NOTE — Discharge Instructions (Signed)
AMBULATORY SURGERY  DISCHARGE INSTRUCTIONS   1) The drugs that you were given will stay in your system until tomorrow so for the next 24 hours you should not:  A) Drive an automobile B) Make any legal decisions C) Drink any alcoholic beverage   2) You may resume regular meals tomorrow.  Today it is better to start with liquids and gradually work up to solid foods.  You may eat anything you prefer, but it is better to start with liquids, then soup and crackers, and gradually work up to solid foods.   3) Please notify your doctor immediately if you have any unusual bleeding, trouble breathing, redness and pain at the surgery site, drainage, fever, or pain not relieved by medication.    4) Additional Instructions:        Please contact your physician with any problems or Same Day Surgery at [phone], Monday through Friday 6 am to 4 pm, or [place] at [facility] number at [phone].Patient may remove outer bandage 04/24/2019, leave Steri-Strips on.  Patient may shower 04/24/2019.

## 2019-04-23 NOTE — Anesthesia Post-op Follow-up Note (Signed)
Anesthesia QCDR form completed.        

## 2019-04-23 NOTE — H&P (Signed)
Jump to Section ? Document InformationEncounter DetailsGoalsImaging ResultsLab ResultsLast Filed Vital SignsPatient ContactsPatient DemographicsPlan of TreatmentProceduresProgress NotesReason for VisitSocial HistoryVisit Diagnoses Latanya Maudlin "[employer], generated on Sep. 08, 2020September 08, 2020 Printout Information  Document Contents Document Received Date Document Source Organization  Office Visit Sep. 08, 2020September 08, 2020 [place]   Patient Demographics - 85 y.o. Male; born [birth year]  Patient Address Communication Language Race / Ethnicity Marital Status  [address] [place], [place] [address] [phone] [facility]) [phone] (Home) [email or web address] English (Preferred) White / Not Hispanic or Latino Married   Reason for Visit  Reason Comments  Pacer-ICD    Encounter Details  Date Type Department Care Team Description  04/10/2019 Office Visit [facility]  [place], [place] [address]  [phone]  Isaias Cowman, MD  [place]  [facility]  [place], [place] [address]  [phone]  [phone] ([address])    Halford Decamp, [place]  [facility]  [place], [place] [address]  [phone]  [phone] (Fax)  Implantable cardioverter-defibrillator (ICD) at end of battery life (Primary Dx);  Medication management;  Pre-op testing;  ICD (implantable cardioverter-defibrillator) in place;  Chronic systolic congestive heart failure (CMS-HCC);  Paroxysmal atrial fibrillation (CMS-HCC);  Nonrheumatic aortic valve insufficiency;  Other cardiomyopathy (CMS-HCC)   Social History - documented as of this encounter Tobacco Use Types Packs/Day Years Used Date  Never Smoker  0 0   Smokeless Tobacco: Former Engineer, manufacturing systems  Quit: 08/15/1983   Comments: chewed tobacco   Alcohol Use Drinks/Week oz/Week Comments  Yes 0 Standard  drinks or equivalent  0.0 occasionally   Sex Assigned at Birth Date Recorded  Male 07/01/2016 5:40 PM EST   COVID-19 Exposure Response Date Recorded  In the last month, have you been in contact with someone who was confirmed or suspected to have [place] / COVID-19? No / Unsure 04/10/2019 9:05 AM EDT   Last Filed Vital Signs - documented in this encounter Vital Sign Reading Time Taken Comments  Blood Pressure 140/80 04/10/2019 9:41 AM EDT   Pulse 80 04/10/2019 9:41 AM EDT   Temperature - -   Respiratory Rate - -   Oxygen Saturation 94% 04/10/2019 9:41 AM EDT   Inhaled Oxygen Concentration - -   Weight 68.5 kg (151 lb) 04/10/2019 9:41 AM EDT   Height 182.9 cm (6') 04/10/2019 9:41 AM EDT   Body Mass Index 20.48 04/10/2019 9:41 AM EDT    Progress Notes - documented in this encounter Halford Decamp, [place] - 04/10/2019 9:30 AM EDT Formatting of this note might be different from the original. Established Patient Visit   Chief Complaint: Chief Complaint  Patient presents with   Pacer-ICD  Date of Service: 04/10/2019 Date of Birth: 1934/01/16 PCP: Velna Ochs, MD  History of Present Illness: Jack Harris is a 85 y.o.male patient who returns for  1. Nonischemic dilated cardiomyopathy 2. Chronic systolic congestive heart failure 3. Status post dual-chamber ICD 04/2007 4. Paroxysmal atrial fibrillation 5. Hyperlipidemia  The patient returns for follow-up, reports doing well overall. He denies chest pain. He has mild exertional shortness of breath, which is unchanged. He denies palpitations or heart racing. He denies peripheral edema. The patient is very active, plays golf regularly, does yard work without difficulty. ICD interrogation earlier today revealed predominant atrial pacing with ventricular sensing, no episodes of VT/VF, one episode of atrial tachycardia or atrial fibrillation, at ERI. 2D echocardiogram on 04/23/2017  revealed severely decreased left  ventricular function, with an LVEF 25-30%, moderate aortic regurgitation, and mild mitral, tricuspid, and pulmonic regurgitation. Findings appear to be unchanged compared to prior echocardiogram.  The patient has paroxysmal atrial fibrillation, chads vasc score of 3, on warfarin. INR was 3.4 on 04/11/2019. Appropriate dose adjustments recommended. Labs on 04/10/19/20 revealed TSH 2.267, AST 24 and ALT 16. Chest xray 04/10/2019 without changes or acute cardiopulmonary disease. He denies a new persistent cough or worsening shortness of breath.  The patient has hyperlipidemia, LDL cholesterol was 97 on 09/10/18, on atorvastatin, which is well tolerated without apparent side effects, followed by his primary care provider. The patient follows a low-cholesterol, low-fat diet.  Past Medical and Surgical History  Past Medical History Past Medical History:  Diagnosis Date   Allergy   Arrhythmia   Atrial fibrillation (CMS-HCC)   Cardiomyopathy (CMS-HCC)   Cardiomyopathy (CMS-HCC)   Chickenpox   Chronic systolic congestive heart failure (CMS-HCC)   Hyperlipidemia   Moderate mitral regurgitation   Shingles   Thyroid disease   Transitional cell carcinoma of bladder (CMS-HCC)   Transitional cell carcinoma of bladder (CMS-HCC)   Past Surgical History He has a past surgical history that includes Status post ICD and Insert / replace / remove pacemaker.   Medications and Allergies  Current Medications  Current Outpatient Medications  Medication Sig Dispense Refill   AMIOdarone (PACERONE) 200 MG tablet TAKE 1 TABLET (200 MG TOTAL) BY MOUTH ONCE DAILY. 90 tablet 3   atorvastatin (LIPITOR) 10 MG tablet TAKE 1 TABLET BY MOUTH EVERY DAY 90 tablet 3   ipratropium (ATROVENT) 0.06 % nasal spray USE 2 PUFFS EACH NOSTRIL AS NEEDED FOR RUNNY NOSE EVERY 8 HOURS 15   levothyroxine (SYNTHROID) 100 MCG tablet TAKE 1 TABLET (100 MCG TOTAL) BY MOUTH ONCE DAILY TAKE ON AN EMPTY STOMACH WITH A GLASS OF  WATER AT LEAST 30-60 MINUTES BEFORE BREAKFAST. 90 tablet 1   metoprolol succinate (TOPROL-XL) 50 MG XL tablet TAKE 1 TABLET BY MOUTH EVERY DAY 90 tablet 3   warfarin (COUMADIN) 2.5 MG tablet TAKE 1 TABLET (2.5 MG TOTAL) BY MOUTH ONCE DAILY. 90 tablet 3   warfarin (COUMADIN) 3 MG tablet TAKE 1 TABLET BY MOUTH EVERY OTHER DAY 45 tablet 3   No current facility-administered medications for this visit.   Allergies: Ciprofloxacin, Levaquin [levofloxacin], Spironolactone, and Ace inhibitors  Social and Family History  Social History reports that he has never smoked. He quit smokeless tobacco use about 35 years ago. His smokeless tobacco use included chew. He reports current alcohol use. He reports that he does not use drugs.  Family History Family History  Problem Relation Age of Onset   Myocardial Infarction (Heart attack) Mother   Asthma Mother   Myocardial Infarction (Heart attack) Father   COPD Father   Heart failure Father   Breast cancer Sister   Review of Systems   Review of Systems: The patient denies chest pain, with chornic exertional shortness of breath, with orthopnea, without paroxysmal nocturnal dyspnea, pedal edema, palpitations, heart racing, presyncope, syncope, with recent cold, with LUQ pain. Review of 10 Systems is negative except as described above.  Physical Examination   Vitals: BP 140/80   Pulse 80   Ht 182.9 cm (6')   Wt 68.5 kg (151 lb)   SpO2 94%   BMI 20.48 kg/m  Ht:182.9 cm (6') Wt:68.5 kg (151 lb) JJK:KXFG surface area is 1.87 meters squared. Body mass index is 20.48 kg/m.  General: Alert  and oriented. Well-appearing. No acute distress. HEENT: Pupils equally reactive to light and accomodation  Neck: Supple, no JVD.  Lungs: Normal effort of breathing at rest. No crackles Heart: Regular rate and rhythm. No murmur, rub, or gallop Abdomen: nondistended, with normal bowel sounds Extremities: no cyanosis, clubbing, or edema.  Peripheral Pulses: 2+  radial Skin: Warm, dry, no diaphoresis  Assessment   85 y.o. male with  1. Implantable cardioverter-defibrillator (ICD) at end of battery life  2. Medication management  3. Pre-op testing  4. ICD (implantable cardioverter-defibrillator) in place  5. Chronic systolic congestive heart failure (CMS-HCC)  6. Paroxysmal atrial fibrillation (CMS-HCC)  7. Nonrheumatic aortic valve insufficiency  8. Other cardiomyopathy (CMS-HCC)   85 year old gentleman with nonischemic dilated cardiomyopathy, chronic systolic congestive heart failure, status post dual-chamber ICD, minimally symptomatic. The patient has paroxysmal atrial fibrillation, on warfarin for stroke prevention, and amiodarone for rhythm control. Toxicity monitoring labs are up to date. The patient has hyperlipidemia, with adequate control of LDL cholesterol, on atorvastatin. ICD is at [facility].  Plan   1. Continue current medications 2. Continue warfarin for stroke prevention, target INR 2.0 to 3.0 3. Counseled patient about low-sodium diet 4. DASH diet printed instructions given to the patient 5. Counseled patient about low-cholesterol diet 6. Continue atorvastatin for hyperlipidemia management 7. Proceed with ICD generator change-out. The risks and benefits have been discussed with the patient and wishes to proceed.  8. CMP, TSH, INR, chest xray today 9. Return to clinic 1 week after ICD change out  Orders Placed This Encounter  Procedures   X-ray chest PA and lateral   Comprehensive Metabolic Panel (CMP)   Thyroid Stimulating-Hormone (TSH)   No follow-ups on file. I personally performed the service, non-incident to. (WP)   ANNA Shirlee More, PA-C   Electronically signed by Halford Decamp, [place] at 04/11/2019 11:11 AM EDT   Plan of Treatment - documented as of this encounter Upcoming Encounters Upcoming Encounters  Date Type Specialty Care Team Description  05/20/2019 Procedure visit Cardiology Isaias Cowman,  MD  [place]  [facility]  [place], [place] [address]  [phone]  [phone] ([phone]    05/20/2019 Office Visit Cardiology Isaias Cowman, MD  [address]  [facility]  [place], [place] [address]  [phone]  [phone] (Fax)    09/11/2019 Ancillary Orders Lab Velna Ochs, MD  [address] [place]  [place], [place] [address]  [phone]  [phone] (Fax)    09/19/2019 Office Visit Internal Medicine Velna Ochs, [place]  [place],  [address]  [phone]  [phone] (Fax)     Goals - documented as of this encounter Goal Patient Goal Type Associated Problems Recent Progress Patient-Stated? Author  Maintain health/healthy lifestyle  Lifestyle   Yes [place], Caryl Pina, CMA   Procedures - documented in this encounter Procedure Name Priority Date/Time Associated Diagnosis Comments  TSH (THYROID STIMULATING HORMONE) - DUKE AFFILIATE, [place] Routine 04/10/2019 10:43 AM EDT Medication management  Results for this procedure are in the results section.   COMPREHENSIVE METABOLIC PANEL (CMP) - DUKE AFFILIATE, [place]ODLE Routine 04/10/2019 10:43 AM EDT Medication management  Results for this procedure are in the results section.    Lab Results - documented in this encounter Table of Contents for Lab Results  Thyroid Stimulating-Hormone (TSH) (04/10/2019 10:43 AM EDT)  Comprehensive Metabolic Panel (CMP) (44/96/7591 10:43 AM EDT)     Thyroid Stimulating-Hormone (TSH) (04/10/2019 10:43 AM EDT) Thyroid Stimulating-Hormone (TSH) (04/10/2019 10:43 AM  EDT)  Component Value Ref Range Performed At Pathologist Signature  Thyroid Stimulating Hormone (TSH) 2.267 0.450-5.330 uIU/ml uIU/mL [place] - LAB    Thyroid Stimulating-Hormone (TSH) (04/10/2019 10:43 AM EDT)  Specimen  Blood   Thyroid Stimulating-Hormone (TSH) (04/10/2019 10:43 AM EDT)  Performing  Organization Address City/State/Zipcode Phone Number  [place]  [place], [place] [address]     Back to top of Lab Results    Comprehensive Metabolic Panel (CMP) (22/29/7989 10:43 AM EDT) Comprehensive Metabolic Panel (CMP) (21/19/4174 10:43 AM EDT)  Component Value Ref Range Performed At Pathologist Signature  Glucose 93 70 - 110 mg/dL [place] - LAB   Sodium 140 136 - 145 mmol/L [facility] - [facility]   Potassium 5.0 3.6 - 5.1 mmol/L [facility] - LAB   Chloride 103 97 - 109 mmol/L [facility]   Carbon Dioxide (CO2) 31.6 22.0 - 32.0 mmol/L [facility]   Urea Nitrogen (BUN) 16 7 - 25 mg/dL [facility] - LAB   Creatinine 1.4 (H) 0.7 - 1.3 mg/dL [place] - LAB   Glomerular Filtration Rate (eGFR), MDRD Estimate 48 (L) >60 mL/min/1.73sq m [facility] - LAB   Calcium 9.2 8.7 - 10.3 mg/dL [place] - LAB   AST  24 8 - 39 U/L [facility]   ALT  16 6 - 57 U/L [facility]   Alk Phos (alkaline Phosphatase) 71 34 - 104 U/L [facility]   Albumin 4.3 3.5 - 4.8 g/dL [facility]   Bilirubin, Total 0.6 0.3 - 1.2 mg/dL [facility]   Protein, Total 6.5 6.1 - 7.9 g/dL [facility]   A/G Ratio 2.0 1.0 - 5.0 gm/dL [facility]    Comprehensive Metabolic Panel (CMP) (03/27/4817 10:43 AM EDT)  Specimen  Blood   Comprehensive Metabolic Panel (CMP) (56/31/4970 10:43 AM EDT)  Performing Organization Address City/State/Zipcode Phone Number  [place]  [place], [place] [address]     Back to top of Lab Results  Imaging Results - documented in this encounter  X-ray chest PA and lateral (04/10/2019 10:38 AM EDT) X-ray chest PA and lateral (04/10/2019 10:38 AM EDT)  Specimen     X-ray chest PA and lateral  (04/10/2019 10:38 AM EDT)  Narrative Performed At  This result has an attachment that is not available.        Visit Diagnoses - documented in this encounter Diagnosis  Implantable cardioverter-defibrillator (ICD) at end of battery life - Primary   Medication management   Pre-op testing  Unspecified pre-operative examination   ICD (implantable cardioverter-defibrillator) in place   Chronic systolic congestive heart failure (CMS-HCC)   Paroxysmal atrial fibrillation (CMS-HCC)  Atrial fibrillation   Nonrheumatic aortic valve insufficiency   Other cardiomyopathy (CMS-HCC)   Images  Patient Contacts  Contact Name Contact Address Communication Relationship to Patient  Jack Harris Unknown [phone] [facility]) Other, Emergency Contact  Jack Harris Unknown [phone] [facility]) Son or Daughter, Emergency Contact  Document Information  Primary Care Provider Other Service Providers Document Coverage Dates  Velna Ochs, MD (Nov. 01, 2018November 01, 2018 - Present) DM: [phone] (Work) [phone] (Fax) [place], [place] [address] Internal Medicine    Aug. 27,  2020August 27, [place] [address] [place], [place] [address]   Encounter Providers Encounter Date  Halford Decamp, [place] (Attending) DM: [phone] (Work) [phone] (Fax) [place], [place] [address] Cardiovascular Disease Aug. 27, 2020August 27, 2020    Show All Sections

## 2019-04-23 NOTE — Anesthesia Preprocedure Evaluation (Signed)
Anesthesia Evaluation  Patient identified by MRN, date of birth, ID band Patient awake    Reviewed: Allergy & Precautions, NPO status , Patient's Chart, lab work & pertinent test results, reviewed documented beta blocker date and time   Airway Mallampati: II  TM Distance: >3 FB     Dental  (+) Chipped   Pulmonary shortness of breath, asthma ,           Cardiovascular +CHF  + Cardiac Defibrillator      Neuro/Psych    GI/Hepatic   Endo/Other  Hypothyroidism   Renal/GU      Musculoskeletal   Abdominal   Peds  Hematology   Anesthesia Other Findings Lbbb.  Reproductive/Obstetrics                             Anesthesia Physical Anesthesia Plan  ASA: III  Anesthesia Plan: MAC   Post-op Pain Management:    Induction: Intravenous  PONV Risk Score and Plan:   Airway Management Planned:   Additional Equipment:   Intra-op Plan:   Post-operative Plan:   Informed Consent: I have reviewed the patients History and Physical, chart, labs and discussed the procedure including the risks, benefits and alternatives for the proposed anesthesia with the patient or authorized representative who has indicated his/her understanding and acceptance.       Plan Discussed with: CRNA  Anesthesia Plan Comments:         Anesthesia Quick Evaluation

## 2019-04-23 NOTE — Op Note (Signed)
[facility]   04/23/2019                     12:46 PM  PATIENT:  Jack Harris    PRE-OPERATIVE DIAGNOSIS:  end of life battery  POST-OPERATIVE DIAGNOSIS:  Same  PROCEDURE:  ICD GENERATOR CHANGE  SURGEON:  Isaias Cowman, MD    ANESTHESIA:     PREOPERATIVE INDICATIONS:  Jack Harris is a  85 y.o. male with a diagnosis of end of life battery who failed conservative measures and elected for surgical management.    The risks benefits and alternatives were discussed with the patient preoperatively including but not limited to the risks of infection, bleeding, cardiopulmonary complications, the need for revision surgery, among others, and the patient was willing to proceed.   OPERATIVE PROCEDURE: The patient was brought to the operating room in a fasting state.  The left pectoral region was prepped and draped in usual sterile manner.  Anesthesia was obtained 1% lidocaine locally.  A 6 cm incision was performed to the left pectoral region.  The existing ICD generator was retrieved by electrocautery and blunt dissection.  Leads were disconnected and connected to a new dual-chamber ICD generator ( Medtronic Evera MRI XT DR D [id number] ).  The device pocket was irrigated with gentamicin solution.  The ICD generator was positioned into the pocket and the pocket was closed with 2-0 and 4-0 Vicryl, respectively.  Steri-Strips and pressure dressing were applied.  Postprocedural interrogation revealed appropriate dual-chamber atrial and ventricular sensing and pacing thresholds.  There were no periprocedural complications.   ICD Criteria  Current LVEF:25-30%. Within 12 months prior to implant: No   Heart failure history: Yes, Class II  Cardiomyopathy history: Yes, Non-Ischemic Cardiomyopathy.  Atrial Fibrillation/Atrial Flutter: Yes, Paroxysmal.  Ventricular tachycardia history:  No.  Cardiac arrest history: No.  History of syndromes with risk of sudden death: No.  Previous ICD: Yes, Reason for ICD:  Primary prevention.  Current ICD indication: Primary  PPM indication: Yes. Pacing type: Both. Greater than 40% RV pacing requirement anticipated. Indication: Sick Sinus Syndrome   Class I or II Bradycardia indication present: Yes  Beta Blocker therapy for 3 or more months: Yes, prescribed.   Ace Inhibitor/ARB therapy for 3 or more months: Yes, prescribed.

## 2019-04-23 NOTE — Interval H&P Note (Signed)
History and Physical Interval Note:  04/23/2019 9:02 AM  Jack Harris  has presented today for surgery, with the diagnosis of end of life battery.  The various methods of treatment have been discussed with the patient and family. After consideration of risks, benefits and other options for treatment, the patient has consented to  Procedure(s): ICD GENERATOR CHANGE (N/A) as a surgical intervention.  The patient's history has been reviewed, patient examined, no change in status, stable for surgery.  I have reviewed the patient's chart and labs.  Questions were answered to the patient's satisfaction.     Shelvie Salsberry [employer]

## 2019-04-23 NOTE — Transfer of Care (Signed)
Immediate Anesthesia Transfer of Care Note  Patient: Jack Harris [facility]  Procedure(s) Performed: ICD GENERATOR CHANGE (Left Chest)  Patient Location: PACU  Anesthesia Type:General  Level of Consciousness: drowsy and patient cooperative  Airway & Oxygen Therapy: Patient Spontanous Breathing  Post-op Assessment: Report given to RN and Post -op Vital signs reviewed and stable  Post vital signs: Reviewed and stable  Last Vitals:  Vitals Value Taken Time  BP 114/59 04/23/19 1247  Temp    Pulse 59 04/23/19 1248  Resp 13 04/23/19 1248  SpO2 98 % 04/23/19 1248  Vitals shown include unvalidated device data.  Last Pain:  Vitals:   04/23/19 1242  TempSrc:   PainSc: (P) 0-No pain         Complications: No apparent anesthesia complications

## 2019-04-30 ENCOUNTER — Encounter: Payer: Self-pay | Admitting: Cardiology

## 2019-05-21 ENCOUNTER — Encounter: Payer: Self-pay | Admitting: Cardiology

## 2020-09-20 DIAGNOSIS — N1831 Chronic kidney disease, stage 3a: Secondary | ICD-10-CM | POA: Diagnosis present

## 2021-03-27 ENCOUNTER — Emergency Department: Payer: Medicare Other

## 2021-03-27 ENCOUNTER — Emergency Department
Admission: EM | Admit: 2021-03-27 | Discharge: 2021-03-27 | Disposition: A | Discharge status: Home / Self Care | Payer: Medicare Other | Attending: Emergency Medicine | Admitting: Emergency Medicine

## 2021-03-27 ENCOUNTER — Other Ambulatory Visit: Payer: Self-pay

## 2021-03-27 DIAGNOSIS — R5383 Other fatigue: Secondary | ICD-10-CM | POA: Diagnosis present

## 2021-03-27 DIAGNOSIS — F17220 Nicotine dependence, chewing tobacco, uncomplicated: Secondary | ICD-10-CM | POA: Insufficient documentation

## 2021-03-27 DIAGNOSIS — Z8551 Personal history of malignant neoplasm of bladder: Secondary | ICD-10-CM | POA: Insufficient documentation

## 2021-03-27 DIAGNOSIS — E039 Hypothyroidism, unspecified: Secondary | ICD-10-CM | POA: Insufficient documentation

## 2021-03-27 DIAGNOSIS — I5022 Chronic systolic (congestive) heart failure: Secondary | ICD-10-CM | POA: Diagnosis not present

## 2021-03-27 DIAGNOSIS — Z79899 Other long term (current) drug therapy: Secondary | ICD-10-CM | POA: Insufficient documentation

## 2021-03-27 DIAGNOSIS — U071 COVID-19: Secondary | ICD-10-CM | POA: Diagnosis not present

## 2021-03-27 DIAGNOSIS — R52 Pain, unspecified: Secondary | ICD-10-CM

## 2021-03-27 DIAGNOSIS — J45909 Unspecified asthma, uncomplicated: Secondary | ICD-10-CM | POA: Diagnosis not present

## 2021-03-27 NOTE — ED Notes (Signed)
Pt still needs labs

## 2021-03-27 NOTE — ED Triage Notes (Signed)
Pt to ER via [facility] with complaints of generalized body aches, cough, and congestion x3 days. Reports using an at home covid test and testing positive today. Denies any known fevers at home. Denies chest pain and shortness of breath.

## 2021-03-27 NOTE — ED Provider Notes (Signed)
[facility]  ____________________________________________   Event Date/Time   First MD Initiated Contact with Patient 03/27/21 1643     (approximate)  I have reviewed the triage vital signs and the nursing notes.   HISTORY  Chief Complaint Generalized Body Aches    HPI Jack Harris is a 87 y.o. male past medical history of atrial fibrillation, chronic systolic heart failure status post ICD placement, aortic insufficiency who presents with positive home COVID test.  Patient tells me that for the past 3 days he has had some congestion fatigue and body aches.  He took a COVID test today that was positive.  His home nurse wanted him to have a chest x-ray done to rule out any pneumonia.  He has really had no cough and denies shortness of breath or chest pain.  He is eating and drinking normally.  Denies fevers or chills.  He denies abdominal pain, nausea vomiting.  Patient is vaccinated.  Patient also wondering about any potential COVID therapies.         Past Medical History:  Diagnosis Date   AICD (automatic cardioverter/defibrillator) present    [employer]   Aortic insufficiency    Asthma    childhood asthma   Atrial fibrillation (HCC)    Cancer (HCC)    bladder, 10 yrs ago   Cancer ([place])    squamous cell carcinoma   Cardiomyopathy ([place])    CHF (congestive heart failure) (HCC)    Hyperlipidemia    Hypothyroidism    Paralysis of vocal cords and larynx, unilateral    Shortness of breath dyspnea     There are no problems to display for this patient.   Past Surgical History:  Procedure Laterality Date   BLADDER SURGERY     Surgeon:Dr. Bernardo Heater, Location [place]   EYE SURGERY Bilateral    Cataract Extraction with IOL   IMPLANTABLE CARDIOVERTER DEFIBRILLATOR (ICD) GENERATOR CHANGE Left 04/23/2019   Procedure: ICD GENERATOR CHANGE;  Surgeon: Isaias Cowman, MD;  Location: [facility] ORS;   Service: Cardiovascular;  Laterality: Left;   THYROPLASTY Left 03/02/2016   Procedure: THYROPLASTY;  Surgeon: Beverly Gust, MD;  Location: [facility] OR[facility];  Service: ENT;  Laterality: Left;    Prior to Admission medications   Medication Sig Start Date End Date Taking? Authorizing Provider  amiodarone (PACERONE) 200 MG tablet Take 200 mg by mouth every evening.     [provider]  atorvastatin (LIPITOR) 10 MG tablet Take 10 mg by mouth daily at 6 PM.    [provider]  cephALEXin (KEFLEX) 250 MG capsule Take 2 capsules (500 mg total) by mouth 2 (two) times daily. 04/23/19   Paraschos, Alexander, MD  levothyroxine (SYNTHROID) 100 MCG tablet Take 100 mcg by mouth daily before breakfast. 02/06/19   [provider]  meloxicam (MOBIC) 15 MG tablet Take 15 mg by mouth daily as needed for pain.    [provider]  metoprolol succinate (TOPROL-XL) 50 MG 24 hr tablet Take 50 mg by mouth every evening. Take with or immediately following a meal.     [provider]  vitamin B-12 (CYANOCOBALAMIN) 1000 MCG tablet Take 1,000 mcg by mouth daily.    [provider]  warfarin (COUMADIN) 2.5 MG tablet Take 2.5 mg by mouth every evening.     [provider]    Allergies Ciprofloxacin and Levaquin [levofloxacin in d5w]  No family history on  file.  Social History Social History   Tobacco Use   Smoking status: Never   Smokeless tobacco: Former    Types: Chew  Substance Use Topics   Alcohol use: Yes    Comment: occ   Drug use: No    Review of Systems   Review of Systems  Constitutional:  Positive for fatigue. Negative for fever.  HENT:  Negative for congestion.   Respiratory:  Negative for shortness of breath.   Cardiovascular:  Negative for chest pain and palpitations.  Gastrointestinal:  Negative for abdominal pain, nausea and vomiting.  Genitourinary:  Negative for dysuria.  Musculoskeletal:  Positive for myalgias.  Neurological:   Positive for headaches.  All other systems reviewed and are negative.  Physical Exam Updated Vital Signs BP 117/61   Pulse 64   Temp 99.7 F (37.6 C) (Oral)   Resp 20   Ht 6' (1.829 m)   Wt 73.9 kg   SpO2 100%   BMI 22.11 kg/m   Physical Exam Vitals and nursing note reviewed.  Constitutional:      General: He is not in acute distress.    Appearance: Normal appearance.  HENT:     Head: Normocephalic and atraumatic.  Eyes:     General: No scleral icterus.    Conjunctiva/sclera: Conjunctivae normal.  Cardiovascular:     Rate and Rhythm: Normal rate and regular rhythm.  Pulmonary:     Effort: Pulmonary effort is normal. No respiratory distress.     Breath sounds: Normal breath sounds. No wheezing or rhonchi.  Abdominal:     Tenderness: There is no guarding or rebound.  Musculoskeletal:        General: No swelling, deformity or signs of injury.     Cervical back: Normal range of motion.     Right lower leg: No edema.     Left lower leg: No edema.  Skin:    Coloration: Skin is not jaundiced or pale.  Neurological:     General: No focal deficit present.     Mental Status: He is alert and oriented to person, place, and time. Mental status is at baseline.  Psychiatric:        Mood and Affect: Mood normal.        Behavior: Behavior normal.     LABS (all labs ordered are listed, but only abnormal results are displayed)  Labs Reviewed - No data to display ____________________________________________  EKG  Atrial paced rhythm, left bundle branch block, no acute ischemic changes ____________________________________________  RADIOLOGY I, Madelin Headings, personally viewed and evaluated these images (plain radiographs) as part of my medical decision making, as well as reviewing the written report by the radiologist.  ED MD interpretation:    I reviewed the chest x-ray which does not show any pneumonia or  infiltrate ____________________________________________   PROCEDURES  Procedure(s) performed (including Critical Care):  Procedures   ____________________________________________   INITIAL IMPRESSION / [place] / ED COURSE    Patient is an 87 year old male who presents with several days of mild myalgias and fatigue and a positive home COVID test.  His symptoms are relatively minor and he has no dyspnea, chest pain or inability to tolerate p.o.  His vital signs are within normal limits, no tachycardia and he satting 100% on room air.  He is very well-appearing.  His main request is that he has a chest x-ray to rule out any pneumonia and he is interested in discussing outpatient medications for COVID.  I discussed with him paxlovid including the potential benefits as well as side effects and drug interactions.  Patient was not interested in pursuing this and as he is on amiodarone and warfarin I doubt that he would qualify.  Chest x-ray obtained which does not show any infiltrate.  Discussed return precautions for dyspnea, high fevers or inability to tolerate p.o.  Advised that he quarantine for the next 5 days.  Clinical Course as of 03/27/21 1834  Nancy Fetter Mar 27, 2021  1720 IMPRESSION: No acute abnormality. No evidence of pneumonia.     [KM]    Clinical Course User Index [KM] Rada Hay, MD     ____________________________________________   FINAL CLINICAL IMPRESSION(S) / ED DIAGNOSES  Final diagnoses:  [place]     ED Discharge Orders     None        Note:  This document was prepared using Dragon voice recognition software and may include unintentional dictation errors.    Rada Hay, MD 03/27/21 [phone]

## 2021-03-27 NOTE — Discharge Instructions (Addendum)
Please return to the emergency department for shortness of breath, inability to eat or drink or worsening symptoms.

## 2022-07-05 ENCOUNTER — Observation Stay
Admission: EM | Admit: 2022-07-05 | Discharge: 2022-07-06 | Disposition: A | Discharge status: Home / Self Care | Payer: Medicare Other | Attending: Internal Medicine | Admitting: Internal Medicine

## 2022-07-05 ENCOUNTER — Emergency Department: Payer: Medicare Other

## 2022-07-05 ENCOUNTER — Other Ambulatory Visit: Payer: Self-pay

## 2022-07-05 ENCOUNTER — Encounter: Payer: Self-pay | Admitting: *Deleted

## 2022-07-05 DIAGNOSIS — Z79899 Other long term (current) drug therapy: Secondary | ICD-10-CM | POA: Diagnosis not present

## 2022-07-05 DIAGNOSIS — N1831 Chronic kidney disease, stage 3a: Secondary | ICD-10-CM | POA: Diagnosis not present

## 2022-07-05 DIAGNOSIS — J45909 Unspecified asthma, uncomplicated: Secondary | ICD-10-CM | POA: Insufficient documentation

## 2022-07-05 DIAGNOSIS — Z95810 Presence of automatic (implantable) cardiac defibrillator: Secondary | ICD-10-CM | POA: Diagnosis not present

## 2022-07-05 DIAGNOSIS — I4891 Unspecified atrial fibrillation: Secondary | ICD-10-CM | POA: Diagnosis present

## 2022-07-05 DIAGNOSIS — I5023 Acute on chronic systolic (congestive) heart failure: Principal | ICD-10-CM | POA: Diagnosis present

## 2022-07-05 DIAGNOSIS — Z85828 Personal history of other malignant neoplasm of skin: Secondary | ICD-10-CM | POA: Insufficient documentation

## 2022-07-05 DIAGNOSIS — E039 Hypothyroidism, unspecified: Secondary | ICD-10-CM | POA: Diagnosis present

## 2022-07-05 DIAGNOSIS — Z87891 Personal history of nicotine dependence: Secondary | ICD-10-CM | POA: Diagnosis not present

## 2022-07-05 DIAGNOSIS — Z8551 Personal history of malignant neoplasm of bladder: Secondary | ICD-10-CM | POA: Insufficient documentation

## 2022-07-05 DIAGNOSIS — I480 Paroxysmal atrial fibrillation: Secondary | ICD-10-CM | POA: Diagnosis not present

## 2022-07-05 DIAGNOSIS — I5022 Chronic systolic (congestive) heart failure: Secondary | ICD-10-CM | POA: Diagnosis present

## 2022-07-05 DIAGNOSIS — I509 Heart failure, unspecified: Secondary | ICD-10-CM

## 2022-07-05 DIAGNOSIS — I429 Cardiomyopathy, unspecified: Secondary | ICD-10-CM

## 2022-07-05 DIAGNOSIS — J189 Pneumonia, unspecified organism: Secondary | ICD-10-CM | POA: Diagnosis not present

## 2022-07-05 DIAGNOSIS — R0602 Shortness of breath: Secondary | ICD-10-CM

## 2022-07-05 DIAGNOSIS — Z7901 Long term (current) use of anticoagulants: Secondary | ICD-10-CM | POA: Insufficient documentation

## 2022-07-05 LAB — BASIC METABOLIC PANEL
Anion gap: 9 (ref 5–15)
BUN: 20 mg/dL (ref 8–23)
CO2: 24 mmol/L (ref 22–32)
Calcium: 8.8 mg/dL — ABNORMAL LOW (ref 8.9–10.3)
Chloride: 103 mmol/L (ref 98–111)
Creatinine, Ser: 1.35 mg/dL — ABNORMAL HIGH (ref 0.61–1.24)
GFR, Estimated: 51 mL/min — ABNORMAL LOW (ref >60)
Glucose, Bld: 129 mg/dL — ABNORMAL HIGH (ref 70–99)
Potassium: 4.4 mmol/L (ref 3.5–5.1)
Sodium: 136 mmol/L (ref 135–145)

## 2022-07-05 LAB — CBC
HCT: 39.3 % (ref 39.0–52.0)
HCT: 38.4 % — ABNORMAL LOW (ref 39.0–52.0)
Hemoglobin: 12.5 g/dL — ABNORMAL LOW (ref 13.0–17.0)
Hemoglobin: 12.3 g/dL — ABNORMAL LOW (ref 13.0–17.0)
MCH: 29 pg (ref 26.0–34.0)
MCH: 28.8 pg (ref 26.0–34.0)
MCHC: 31.8 g/dL (ref 30.0–36.0)
MCHC: 32 g/dL (ref 30.0–36.0)
MCV: 91.2 fL (ref 80.0–100.0)
MCV: 89.9 fL (ref 80.0–100.0)
Platelets: 356 10*3/uL (ref 150–400)
Platelets: 359 10*3/uL (ref 150–400)
RBC: 4.31 MIL/uL (ref 4.22–5.81)
RBC: 4.27 MIL/uL (ref 4.22–5.81)
RDW: 15.1 % (ref 11.5–15.5)
RDW: 15.2 % (ref 11.5–15.5)
WBC: 5.2 10*3/uL (ref 4.0–10.5)
WBC: 4.2 10*3/uL (ref 4.0–10.5)
nRBC: 0 % (ref 0.0–0.2)
nRBC: 0 % (ref 0.0–0.2)

## 2022-07-05 LAB — CREATININE, SERUM
Creatinine, Ser: 1.39 mg/dL — ABNORMAL HIGH (ref 0.61–1.24)
GFR, Estimated: 49 mL/min — ABNORMAL LOW (ref >60)

## 2022-07-05 LAB — PROCALCITONIN: Procalcitonin: <0.1 ng/mL

## 2022-07-05 LAB — BRAIN NATRIURETIC PEPTIDE: B Natriuretic Peptide: 857.1 pg/mL — ABNORMAL HIGH (ref 0.0–100.0)

## 2022-07-05 LAB — TROPONIN I (HIGH SENSITIVITY)
Troponin I (High Sensitivity): 24 ng/L — ABNORMAL HIGH (ref <18)
Troponin I (High Sensitivity): 26 ng/L — ABNORMAL HIGH (ref <18)

## 2022-07-05 LAB — TSH: TSH: 8.246 u[IU]/mL — ABNORMAL HIGH (ref 0.350–4.500)

## 2022-07-05 LAB — T4, FREE: Free T4: 1.91 ng/dL — ABNORMAL HIGH (ref 0.61–1.12)

## 2022-07-05 LAB — CBG MONITORING, ED: Glucose-Capillary: 90 mg/dL (ref 70–99)

## 2022-07-05 MED ORDER — INSULIN ASPART 100 UNIT/ML IJ SOLN: 0.0000 [IU] | Freq: Every day | INTRAMUSCULAR | Status: DC

## 2022-07-05 MED ORDER — MORPHINE SULFATE (PF) 2 MG/ML IV SOLN: 2.0000 mg | INTRAVENOUS | Status: DC | PRN

## 2022-07-05 MED ORDER — SODIUM CHLORIDE 0.9% FLUSH
3.0000 mL | Freq: Two times a day (BID) | INTRAVENOUS | Status: DC
  Administered 2022-07-05 – 2022-07-06 (×2): 3 mL via INTRAVENOUS

## 2022-07-05 MED ORDER — DOCUSATE SODIUM 100 MG PO CAPS
100.0000 mg | ORAL_CAPSULE | Freq: Two times a day (BID) | ORAL | Status: DC
  Administered 2022-07-06: 100 mg via ORAL
  Filled 2022-07-05 (×2): qty 1

## 2022-07-05 MED ORDER — HEPARIN SODIUM (PORCINE) 5000 UNIT/ML IJ SOLN
5000.0000 [IU] | Freq: Three times a day (TID) | INTRAMUSCULAR | Status: DC
  Administered 2022-07-05 – 2022-07-06 (×2): 5000 [IU] via SUBCUTANEOUS
  Filled 2022-07-05 (×2): qty 1

## 2022-07-05 MED ORDER — METOPROLOL SUCCINATE ER 50 MG PO TB24
50.0000 mg | ORAL_TABLET | Freq: Every evening | ORAL | Status: DC
  Administered 2022-07-05: 50 mg via ORAL
  Filled 2022-07-05: qty 1

## 2022-07-05 MED ORDER — FUROSEMIDE 10 MG/ML IJ SOLN: 40.0000 mg | Freq: Two times a day (BID) | INTRAMUSCULAR | Status: DC

## 2022-07-05 MED ORDER — ESCITALOPRAM OXALATE 10 MG PO TABS
5.0000 mg | ORAL_TABLET | Freq: Every day | ORAL | Status: DC
  Administered 2022-07-06: 5 mg via ORAL
  Filled 2022-07-05: qty 1

## 2022-07-05 MED ORDER — ACETAMINOPHEN 325 MG PO TABS: 650.0000 mg | ORAL_TABLET | Freq: Four times a day (QID) | ORAL | Status: DC | PRN

## 2022-07-05 MED ORDER — FUROSEMIDE 10 MG/ML IJ SOLN
60.0000 mg | Freq: Once | INTRAMUSCULAR | Status: AC
  Administered 2022-07-05: 60 mg via INTRAVENOUS
  Filled 2022-07-05: qty 8

## 2022-07-05 MED ORDER — SODIUM CHLORIDE 0.9 % IV SOLN: 1.0000 g | INTRAVENOUS | Status: DC

## 2022-07-05 MED ORDER — MIDODRINE HCL 5 MG PO TABS
10.0000 mg | ORAL_TABLET | Freq: Three times a day (TID) | ORAL | Status: DC
  Administered 2022-07-06 (×2): 10 mg via ORAL
  Filled 2022-07-05 (×2): qty 2

## 2022-07-05 MED ORDER — TRAZODONE HCL 50 MG PO TABS: 25.0000 mg | ORAL_TABLET | Freq: Every evening | ORAL | Status: DC | PRN

## 2022-07-05 MED ORDER — SODIUM CHLORIDE 0.9 % IV SOLN
500.0000 mg | Freq: Once | INTRAVENOUS | Status: AC
  Administered 2022-07-05: 500 mg via INTRAVENOUS
  Filled 2022-07-05: qty 5

## 2022-07-05 MED ORDER — ONDANSETRON HCL 4 MG/2ML IJ SOLN: 4.0000 mg | Freq: Four times a day (QID) | INTRAMUSCULAR | Status: DC | PRN

## 2022-07-05 MED ORDER — INSULIN ASPART 100 UNIT/ML IJ SOLN: 0.0000 [IU] | Freq: Three times a day (TID) | INTRAMUSCULAR | Status: DC

## 2022-07-05 MED ORDER — BISACODYL 5 MG PO TBEC: 5.0000 mg | DELAYED_RELEASE_TABLET | Freq: Every day | ORAL | Status: DC | PRN

## 2022-07-05 MED ORDER — AMIODARONE HCL 200 MG PO TABS
200.0000 mg | ORAL_TABLET | Freq: Every evening | ORAL | Status: DC
  Administered 2022-07-05: 200 mg via ORAL
  Filled 2022-07-05: qty 1

## 2022-07-05 MED ORDER — SODIUM CHLORIDE 0.9 % IV SOLN
1.0000 g | Freq: Once | INTRAVENOUS | Status: AC
  Administered 2022-07-05: 1 g via INTRAVENOUS
  Filled 2022-07-05: qty 10

## 2022-07-05 MED ORDER — ATORVASTATIN CALCIUM 20 MG PO TABS: 10.0000 mg | ORAL_TABLET | Freq: Every day | ORAL | Status: DC

## 2022-07-05 MED ORDER — HYDRALAZINE HCL 20 MG/ML IJ SOLN: 5.0000 mg | INTRAMUSCULAR | Status: DC | PRN

## 2022-07-05 MED ORDER — ACETAMINOPHEN 650 MG RE SUPP: 650.0000 mg | Freq: Four times a day (QID) | RECTAL | Status: DC | PRN

## 2022-07-05 MED ORDER — CYANOCOBALAMIN 500 MCG PO TABS
1000.0000 ug | ORAL_TABLET | Freq: Every day | ORAL | Status: DC
  Administered 2022-07-06: 1000 ug via ORAL
  Filled 2022-07-05: qty 2

## 2022-07-05 MED ORDER — FUROSEMIDE 10 MG/ML IJ SOLN
20.0000 mg | Freq: Two times a day (BID) | INTRAMUSCULAR | Status: DC
  Administered 2022-07-06: 20 mg via INTRAVENOUS
  Filled 2022-07-05: qty 4

## 2022-07-05 MED ORDER — ONDANSETRON HCL 4 MG PO TABS: 4.0000 mg | ORAL_TABLET | Freq: Four times a day (QID) | ORAL | Status: DC | PRN

## 2022-07-05 MED ORDER — POLYETHYLENE GLYCOL 3350 17 G PO PACK: 17.0000 g | PACK | Freq: Every day | ORAL | Status: DC | PRN

## 2022-07-05 MED ORDER — OXYCODONE HCL 5 MG PO TABS: 5.0000 mg | ORAL_TABLET | ORAL | Status: DC | PRN

## 2022-07-05 MED ORDER — IOHEXOL 350 MG/ML SOLN
75.0000 mL | Freq: Once | INTRAVENOUS | Status: AC | PRN
  Administered 2022-07-05: 75 mL via INTRAVENOUS

## 2022-07-05 NOTE — ED Provider Triage Note (Signed)
  Emergency Medicine Provider Triage Evaluation Note  Jack Harris [facility] , a 88 y.o.male,  was evaluated in triage.  Pt complains of shortness of breath.  He states that he has been feeling short of breath for the past few months.  Has progressively worsened.  States that he feels more fatigued than usual and also has a loss of appetite.   Review of Systems  Positive: Fatigue, anorexia, shortness of breath Negative: Denies fever, chest pain, vomiting  Physical Exam   Vitals:   07/05/22 1509 07/05/22 1513  BP: 129/83   Pulse: 69   Resp: 20   Temp:  97.9 F (36.6 C)  SpO2: 97%    Gen:   Awake, no distress   Resp:  Normal effort  MSK:   Moves extremities without difficulty  Other:    Medical Decision Making  Given the patient's initial medical screening exam, the following diagnostic evaluation has been ordered. The patient will be placed in the appropriate treatment space, once one is available, to complete the evaluation and treatment. I have discussed the plan of care with the patient and I have advised the patient that an ED physician or mid-level practitioner will reevaluate their condition after the test results have been received, as the results may give them additional insight into the type of treatment they may need.    Diagnostics: Labs, CXR, EKG.  Treatments: none immediately   Teodoro Spray, [place] 07/05/22 1548

## 2022-07-05 NOTE — ED Provider Notes (Signed)
[facility] Provider Note    Event Date/Time   First MD Initiated Contact with Patient 07/05/22 1647     (approximate)   History   Shortness of Breath   HPI  Jack Harris is a 88 y.o. male  who presents to the emergency department today accompanied by family and caregiver. History is primarily obtained from the caregiver.  They state that the patient has been having increasing shortness of breath and generalized weakness.  Did see primary care who obtained an x-ray which was concerning for opacity and per x-ray read radiology recommended CT scan.  This has not yet been arranged.  Patient had at one point been on diuretics but caregiver states he is no longer on those. No know sick contacts.      Physical Exam   Triage Vital Signs: ED Triage Vitals  Enc Vitals Group     BP 07/05/22 1509 129/83     Pulse Rate 07/05/22 1509 69     Resp 07/05/22 1509 20     Temp 07/05/22 1513 97.9 F (36.6 C)     Temp Source 07/05/22 1509 Oral     SpO2 07/05/22 1509 97 %     Weight 07/05/22 1510 143 lb (64.9 kg)     Height 07/05/22 1510 6' (1.829 m)     Head Circumference --      Peak Flow --      Pain Score 07/05/22 1510 1     Pain Loc --      Pain Edu? --      Excl. in [place]? --     Most recent vital signs: Vitals:   07/05/22 1509 07/05/22 1513  BP: 129/83   Pulse: 69   Resp: 20   Temp:  97.9 F (36.6 C)  SpO2: 97%     General: Awake, alert, oriented. CV:  Good peripheral perfusion. Regular rate and rhythm. Resp:  Slightly increased work of breathing. Lungs clear. Abd:  No distention.  Other:  Trace bilateral lower extremity edema.    ED Results / Procedures / Treatments   Labs (all labs ordered are listed, but only abnormal results are displayed) Labs Reviewed  BASIC METABOLIC PANEL - Abnormal; Notable for the following components:      Result Value   Glucose, Bld 129 (*)    Creatinine, Ser 1.35 (*)    Calcium 8.8 (*)    GFR, Estimated 51  (*)    All other components within normal limits  CBC - Abnormal; Notable for the following components:   Hemoglobin 12.5 (*)    All other components within normal limits  BRAIN NATRIURETIC PEPTIDE - Abnormal; Notable for the following components:   B Natriuretic Peptide 857.1 (*)    All other components within normal limits  TROPONIN I (HIGH SENSITIVITY) - Abnormal; Notable for the following components:   Troponin I (High Sensitivity) 24 (*)    All other components within normal limits  TROPONIN I (HIGH SENSITIVITY)     EKG  I, Nance Pear, attending physician, personally viewed and interpreted this EKG  EKG Time: 1516 Rate: 61 Rhythm: atrial paced rhythm Axis: normal Intervals: qtc 471 QRS: LBBB ST changes: no st elevation Impression: abnormal ekg  RADIOLOGY I independently interpreted and visualized the CXR. My interpretation: Left sided pleural effusion Radiology interpretation:  IMPRESSION:  Mild bibasilar subsegmental atelectasis or edema is noted with small  bilateral pleural effusions.    Aortic Atherosclerosis (ICD10-I70.0).  PROCEDURES:  Critical Care performed: No  Procedures   MEDICATIONS ORDERED IN ED: Medications - No data to display   IMPRESSION / MDM / [place] / ED COURSE  I reviewed the triage vital signs and the nursing notes.                              Differential diagnosis includes, but is not limited to, pneumonia, pneumothorax, CHF, PE.  Patient's presentation is most consistent with acute presentation with potential threat to life or bodily function.  The patient is on the cardiac monitor to evaluate for evidence of arrhythmia and/or significant heart rate changes.  Patient presented to the emergency department today because of concerns for shortness of breath and weakness.  Also concern for abnormality seen on outpatient x-ray.  Work-up here is concerning for CHF exacerbation.  BNP is elevated.  Imaging is  concerning for pulmonary edema.  CT scan also was concerning for possible pneumonia/aspiration.  Because of this patient was put on antibiotics.  Discussed with Dr. Posey Pronto with the hospitalist service will plan on admission.  FINAL CLINICAL IMPRESSION(S) / ED DIAGNOSES   Final diagnoses:  Shortness of breath  Congestive heart failure, unspecified HF chronicity, unspecified heart failure type ([place])  Pneumonia due to infectious organism, unspecified laterality, unspecified part of lung     Note:  This document was prepared using Dragon voice recognition software and may include unintentional dictation errors.    Nance Pear, MD 07/05/22 2051

## 2022-07-05 NOTE — ED Notes (Signed)
Report to Tammy, RN

## 2022-07-05 NOTE — ED Triage Notes (Signed)
Pt reports sob.  No chest pain  pt also reports weakness. No n/v/d.   Pt ambulatory to triage.  Pt alert.

## 2022-07-05 NOTE — H&P (Signed)
History and Physical    Chief Complaint: SOB.   HISTORY OF PRESENT ILLNESS: NOBUO Harris is an 88 y.o. male coming for SOB and otherwise ros is negative. Patient was accompanied by caregiver at bedside who stated that patient has been very weak. Patient was seen by PCP and an x-ray was abnormal with recommendations for CT scan which is still in the process.  Pt has PMH as below: Past Medical History:  Diagnosis Date   AICD (automatic cardioverter/defibrillator) present    MEDTRONIC   Aortic insufficiency    Asthma    childhood asthma   Atrial fibrillation (HCC)    Cancer (HCC)    bladder, 10 yrs ago   Cancer ([place])    squamous cell carcinoma   Cardiomyopathy ([place])    CHF (congestive heart failure) (HCC)    Hyperlipidemia    Hypothyroidism    Paralysis of vocal cords and larynx, unilateral    Shortness of breath dyspnea      Review of Systems  Respiratory:  Positive for shortness of breath.   Cardiovascular:  Negative for leg swelling.  All other systems reviewed and are negative.  Allergies  Allergen Reactions   Ciprofloxacin Hives   Levaquin [Levofloxacin In D5w] Hives   Past Surgical History:  Procedure Laterality Date   BLADDER SURGERY     Surgeon:Dr. Bernardo Heater, Location [place]   EYE SURGERY Bilateral    Cataract Extraction with IOL   IMPLANTABLE CARDIOVERTER DEFIBRILLATOR (ICD) GENERATOR CHANGE Left 04/23/2019   Procedure: ICD GENERATOR CHANGE;  Surgeon: Isaias Cowman, MD;  Location: [facility];  Service: Cardiovascular;  Laterality: Left;   THYROPLASTY Left 03/02/2016   Procedure: THYROPLASTY;  Surgeon: Beverly Gust, MD;  Location: [facility];  Service: ENT;  Laterality: Left;    Social History   Socioeconomic History   Marital status: Married    Spouse name: Not on file   Number of children: Not on file   Years of education: Not on file   Highest education level: Not on file   Occupational History   Not on file  Tobacco Use   Smoking status: Never   Smokeless tobacco: Former    Types: Chew  Substance and Sexual Activity   Alcohol use: Not Currently    Comment: occ   Drug use: No   Sexual activity: Not on file  Other Topics Concern   Not on file  Social History Narrative   Not on file   Social Determinants of Health   Financial Resource Strain: Not on file  Food Insecurity: Not on file  Transportation Needs: Not on file  Physical Activity: Not on file  Stress: Not on file  Social Connections: Not on file      CURRENT MEDS:  Current Facility-Administered Medications (Endocrine & Metabolic):    insulin aspart (novoLOG) injection 0-15 Units   insulin aspart (novoLOG) injection 0-5 Units  Current Outpatient Medications (Endocrine & Metabolic):    levothyroxine (SYNTHROID) 100 MCG tablet, Take 100 mcg by mouth daily before breakfast.  Current Facility-Administered Medications (Cardiovascular):    amiodarone (PACERONE) tablet 200 mg   atorvastatin (LIPITOR) tablet 10 mg   furosemide (LASIX) injection 20 mg   hydrALAZINE (APRESOLINE) injection 5 mg   metoprolol succinate (TOPROL-XL) 24 hr tablet 50 mg   midodrine (PROAMATINE) tablet 10 mg  Current Outpatient Medications (Cardiovascular):    amiodarone (PACERONE) 200 MG tablet, Take 200 mg by mouth  every evening.    atorvastatin (LIPITOR) 10 MG tablet, Take 10 mg by mouth daily at 6 PM.   metoprolol succinate (TOPROL-XL) 50 MG 24 hr tablet, Take 12.5 mg by mouth every evening. Take with or immediately following a meal.   midodrine (PROAMATINE) 10 MG tablet, Take 10 mg by mouth 3 (three) times daily.    Current Facility-Administered Medications (Analgesics):    acetaminophen (TYLENOL) tablet 650 mg **OR** acetaminophen (TYLENOL) suppository 650 mg   morphine (PF) 2 MG/ML injection 2 mg   oxyCODONE (Oxy IR/ROXICODONE) immediate release tablet 5 mg  Current Outpatient Medications (Analgesics):     meloxicam (MOBIC) 15 MG tablet, Take 15 mg by mouth daily as needed for pain. (Patient not taking: Reported on 07/05/2022)  Current Facility-Administered Medications (Hematological):    cyanocobalamin (VITAMIN B12) tablet 1,000 mcg   heparin injection 5,000 Units  Current Outpatient Medications (Hematological):    vitamin B-12 (CYANOCOBALAMIN) 1000 MCG tablet, Take 1,000 mcg by mouth daily.   warfarin (COUMADIN) 2.5 MG tablet, Take 2.5 mg by mouth every evening.   Current Facility-Administered Medications (Other):    bisacodyl (DULCOLAX) EC tablet 5 mg   cefTRIAXone (ROCEPHIN) 1 g in sodium chloride 0.9 % 100 mL IVPB   docusate sodium (COLACE) capsule 100 mg   escitalopram (LEXAPRO) tablet 5 mg   ondansetron (ZOFRAN) tablet 4 mg **OR** ondansetron (ZOFRAN) injection 4 mg   polyethylene glycol (MIRALAX / GLYCOLAX) packet 17 g   sodium chloride flush (NS) 0.9 % injection 3 mL   traZODone (DESYREL) tablet 25 mg  Current Outpatient Medications (Other):    escitalopram (LEXAPRO) 5 MG tablet, Take 5 mg by mouth daily.   tamsulosin (FLOMAX) 0.4 MG CAPS capsule, Take 0.4 mg by mouth daily after supper.   cephALEXin (KEFLEX) 250 MG capsule, Take 2 capsules (500 mg total) by mouth 2 (two) times daily. (Patient not taking: Reported on 07/05/2022)    ED Course: Pt in Ed afebrile alert awake oriented O2 sats of 99%. Vitals:   07/05/22 2130 07/05/22 2200 07/05/22 2230 07/06/22 0139  BP: 104/69 113/73 112/70 107/83  Pulse: 87 81 78 79  Resp: 20 (!) 22 (!) 26 (!) 24  Temp:   97.9 F (36.6 C) 98 F (36.7 C)  TempSrc:   Oral Oral  SpO2: 95% 98% 98% 98%  Weight:      Height:       Total I/O In: -  Out: 475 [Urine:475] SpO2: 98 % Blood work in ed shows abnormal creatinine of 1.35, hemoglobin 12.5, troponin of 26, 24. Results for orders placed or performed during the hospital encounter of 07/05/22 (from the past 48 hour(s))  Brain natriuretic peptide     Status: Abnormal   Collection  Time: 07/05/22  3:12 PM  Result Value Ref Range   B Natriuretic Peptide 857.1 (H) 0.0 - 100.0 pg/mL    Comment: Performed at [facility], [address], [place], [place] [address]  Basic metabolic panel     Status: Abnormal   Collection Time: 07/05/22  3:14 PM  Result Value Ref Range   Sodium 136 135 - 145 mmol/L   Potassium 4.4 3.5 - 5.1 mmol/L   Chloride 103 98 - 111 mmol/L   CO2 24 22 - 32 mmol/L   Glucose, Bld 129 (H) 70 - 99 mg/dL    Comment: Glucose reference range applies only to samples taken after fasting for at least 8 hours.   BUN 20 8 - 23 mg/dL  Creatinine, Ser 1.35 (H) 0.61 - 1.24 mg/dL   Calcium 8.8 (L) 8.9 - 10.3 mg/dL   GFR, Estimated 51 (L) >60 mL/min    Comment: (NOTE) Calculated using the CKD-EPI Creatinine Equation (2021)    Anion gap 9 5 - 15    Comment: Performed at [facility], [address] [place], [place] [address]  CBC     Status: Abnormal   Collection Time: 07/05/22  3:14 PM  Result Value Ref Range   WBC 5.2 4.0 - 10.5 K/uL   RBC 4.31 4.22 - 5.81 MIL/uL   Hemoglobin 12.5 (L) 13.0 - 17.0 g/dL   HCT 39.3 39.0 - 52.0 %   MCV 91.2 80.0 - 100.0 fL   MCH 29.0 26.0 - 34.0 pg   MCHC 31.8 30.0 - 36.0 g/dL   RDW 15.1 11.5 - 15.5 %   Platelets 356 150 - 400 K/uL   nRBC 0.0 0.0 - 0.2 %    Comment: Performed at [facility], [place], [place] [address]  Troponin I (High Sensitivity)     Status: Abnormal   Collection Time: 07/05/22  3:14 PM  Result Value Ref Range   Troponin I (High Sensitivity) 24 (H) <18 ng/L    Comment: (NOTE) Elevated high sensitivity troponin I (hsTnI) values and significant  changes across serial measurements may suggest ACS but many other  chronic and acute conditions are known to elevate hsTnI results.  Refer to the "Links" section for chest pain algorithms and additional  guidance. Performed at [facility], [place], [place] [address]   Troponin I  (High Sensitivity)     Status: Abnormal   Collection Time: 07/05/22  5:33 PM  Result Value Ref Range   Troponin I (High Sensitivity) 26 (H) <18 ng/L    Comment: (NOTE) Elevated high sensitivity troponin I (hsTnI) values and significant  changes across serial measurements may suggest ACS but many other  chronic and acute conditions are known to elevate hsTnI results.  Refer to the "Links" section for chest pain algorithms and additional  guidance. Performed at [facility], [address] [place], [place] [address]   Procalcitonin - Baseline     Status: None   Collection Time: 07/05/22  9:56 PM  Result Value Ref Range   Procalcitonin <0.10 ng/mL    Comment:        Interpretation: PCT (Procalcitonin) <= 0.5 ng/mL: Systemic infection (sepsis) is not likely. Local bacterial infection is possible. (NOTE)       Sepsis PCT Algorithm           Lower Respiratory Tract                                      Infection PCT Algorithm    ----------------------------     ----------------------------         PCT < 0.25 ng/mL                PCT < 0.10 ng/mL          Strongly encourage             Strongly discourage   discontinuation of antibiotics    initiation of antibiotics    ----------------------------     -----------------------------       PCT 0.25 - 0.50 ng/mL  PCT 0.10 - 0.25 ng/mL               OR       >80% decrease in PCT            Discourage initiation of                                            antibiotics      Encourage discontinuation           of antibiotics    ----------------------------     -----------------------------         PCT >= 0.50 ng/mL              PCT 0.26 - 0.50 ng/mL               AND        <80% decrease in PCT             Encourage initiation of                                             antibiotics       Encourage continuation           of antibiotics    ----------------------------     -----------------------------        PCT >=  0.50 ng/mL                  PCT > 0.50 ng/mL               AND         increase in PCT                  Strongly encourage                                      initiation of antibiotics    Strongly encourage escalation           of antibiotics                                     -----------------------------                                           PCT <= 0.25 ng/mL                                                 OR                                        > 80% decrease in PCT  Discontinue / Do not initiate                                             antibiotics  Performed at [facility], [address] [place], [place] [address]   TSH     Status: Abnormal   Collection Time: 07/05/22  9:56 PM  Result Value Ref Range   TSH 8.246 (H) 0.350 - 4.500 uIU/mL    Comment: Performed by a 3rd Generation assay with a functional sensitivity of <=0.01 uIU/mL. Performed at [facility], [address] [place], [place] [address]   T4, free     Status: Abnormal   Collection Time: 07/05/22  9:56 PM  Result Value Ref Range   Free T4 1.91 (H) 0.61 - 1.12 ng/dL    Comment: (NOTE) Biotin ingestion may interfere with free T4 tests. If the results are inconsistent with the TSH level, previous test results, or the clinical presentation, then consider biotin interference. If needed, order repeat testing after stopping biotin. Performed at [facility], [address] [place], [place] [address]   CBC     Status: Abnormal   Collection Time: 07/05/22  9:56 PM  Result Value Ref Range   WBC 4.2 4.0 - 10.5 K/uL   RBC 4.27 4.22 - 5.81 MIL/uL   Hemoglobin 12.3 (L) 13.0 - 17.0 g/dL   HCT 38.4 (L) 39.0 - 52.0 %   MCV 89.9 80.0 - 100.0 fL   MCH 28.8 26.0 - 34.0 pg   MCHC 32.0 30.0 - 36.0 g/dL   RDW 15.2 11.5 - 15.5 %   Platelets 359 150 - 400 K/uL   nRBC 0.0 0.0 - 0.2 %    Comment: Performed at [facility], [address] [place], [place] [address]  Creatinine, serum     Status: Abnormal   Collection Time: 07/05/22  9:56 PM  Result Value Ref Range   Creatinine, Ser 1.39 (H) 0.61 - 1.24 mg/dL   GFR, Estimated 49 (L) >60 mL/min    Comment: (NOTE) Calculated using the CKD-EPI Creatinine Equation (2021) Performed at [facility], [address] [place], [place] [address]   CBG monitoring, ED     Status: None   Collection Time: 07/05/22  9:59 PM  Result Value Ref Range   Glucose-Capillary 90 70 - 99 mg/dL    Comment: Glucose reference range applies only to samples taken after fasting for at least 8 hours.    In Ed pt received  Meds ordered this encounter  Medications   furosemide (LASIX) injection 60 mg   iohexol (OMNIPAQUE) 350 MG/ML injection 75 mL   cefTRIAXone (ROCEPHIN) 1 g in sodium chloride 0.9 % 100 mL IVPB    Order Specific Question:   Antibiotic Indication:    Answer:   Other Indication (list below)   azithromycin (ZITHROMAX) 500 mg in sodium chloride 0.9 % 250 mL IVPB   amiodarone (PACERONE) tablet 200 mg   atorvastatin (LIPITOR) tablet 10 mg   metoprolol succinate (TOPROL-XL) 24 hr tablet 50 mg    Take with or immediately following a meal.     cyanocobalamin (VITAMIN B12) tablet 1,000 mcg   DISCONTD: furosemide (LASIX) injection 40 mg   insulin aspart (novoLOG) injection 0-15 Units    Order Specific Question:   Correction coverage:    Answer:   Moderate (  average weight, post-op)    Order Specific Question:   CBG < 70:    Answer:   Implement Hypoglycemia Standing Orders and refer to Hypoglycemia Standing Orders sidebar report    Order Specific Question:   CBG 70 - 120:    Answer:   0 units    Order Specific Question:   CBG 121 - 150:    Answer:   2 units    Order Specific Question:   CBG 151 - 200:    Answer:   3 units    Order Specific Question:   CBG 201 - 250:    Answer:   5 units    Order Specific Question:   CBG 251 - 300:    Answer:   8 units    Order Specific  Question:   CBG 301 - 350:    Answer:   11 units    Order Specific Question:   CBG 351 - 400:    Answer:   15 units    Order Specific Question:   CBG > 400    Answer:   call MD and obtain STAT lab verification   insulin aspart (novoLOG) injection 0-5 Units    Order Specific Question:   Correction coverage:    Answer:   HS scale    Order Specific Question:   CBG < 70:    Answer:   Implement Hypoglycemia Standing Orders and refer to Hypoglycemia Standing Orders sidebar report    Order Specific Question:   CBG 70 - 120:    Answer:   0 units    Order Specific Question:   CBG 121 - 150:    Answer:   0 units    Order Specific Question:   CBG 151 - 200:    Answer:   0 units    Order Specific Question:   CBG 201 - 250:    Answer:   2 units    Order Specific Question:   CBG 251 - 300:    Answer:   3 units    Order Specific Question:   CBG 301 - 350:    Answer:   4 units    Order Specific Question:   CBG 351 - 400:    Answer:   5 units    Order Specific Question:   CBG > 400    Answer:   call MD and obtain STAT lab verification   sodium chloride flush (NS) 0.9 % injection 3 mL   OR Linked Order Group    acetaminophen (TYLENOL) tablet 650 mg    acetaminophen (TYLENOL) suppository 650 mg   oxyCODONE (Oxy IR/ROXICODONE) immediate release tablet 5 mg   morphine (PF) 2 MG/ML injection 2 mg   traZODone (DESYREL) tablet 25 mg   docusate sodium (COLACE) capsule 100 mg   polyethylene glycol (MIRALAX / GLYCOLAX) packet 17 g   bisacodyl (DULCOLAX) EC tablet 5 mg   OR Linked Order Group    ondansetron (ZOFRAN) tablet 4 mg    ondansetron (ZOFRAN) injection 4 mg   hydrALAZINE (APRESOLINE) injection 5 mg   heparin injection 5,000 Units   cefTRIAXone (ROCEPHIN) 1 g in sodium chloride 0.9 % 100 mL IVPB    Order Specific Question:   Antibiotic Indication:    Answer:   Other Indication (list below)   furosemide (LASIX) injection 20 mg   escitalopram (LEXAPRO) tablet 5 mg   midodrine (PROAMATINE)  tablet 10 mg    Unresulted Labs (From admission, onward)  Start     Ordered   07/05/22 2110  Hemoglobin A1c  Once,   URGENT        07/05/22 2111           Admission Imaging : CT Angio Chest PE W and/or Wo Contrast  Result Date: 07/05/2022 CLINICAL DATA:  Shortness of breath EXAM: CT ANGIOGRAPHY CHEST WITH CONTRAST TECHNIQUE: Multidetector CT imaging of the chest was performed using the standard protocol during bolus administration of intravenous contrast. Multiplanar CT image reconstructions and MIPs were obtained to evaluate the vascular anatomy. RADIATION DOSE REDUCTION: This exam was performed according to the departmental dose-optimization program which includes automated exposure control, adjustment of the mA and/or kV according to patient size and/or use of iterative reconstruction technique. CONTRAST:  25m OMNIPAQUE IOHEXOL 350 MG/ML SOLN COMPARISON:  Chest CT dated April 03, 2016 FINDINGS: Cardiovascular: No evidence of pulmonary embolus. Cardiomegaly. Trace pericardial effusion. Left chest wall AICD with leads in the right atrium and right ventricle. Dilated ascending thoracic aorta, measuring up to 4.4 cm, unchanged when compared with the prior exam. Severe calcified plaque of the thoracic aorta. Moderate coronary artery calcifications. Mediastinum/Nodes: Small hiatal hernia. No pathologically enlarged lymph nodes seen in the chest. Lungs/Pleura: Central airways are patent central predominant patchy ground-glass opacities with associated interlobular septal thickening. Peribronchovascular right middle lobe consolidation seen on series 5, image 67. Left lower lobe atelectasis. Small right and moderate left pleural effusions no evidence of pneumothorax Upper Abdomen: Low-attenuation left renal lesions which are likely simple cysts, no specific follow-up imaging is recommended. No acute abnormality. Musculoskeletal: No chest wall abnormality. No acute or significant osseous findings.  Review of the MIP images confirms the above findings. IMPRESSION: 1. No evidence of pulmonary embolus. 2. Central predominant patchy ground-glass opacities with associated interlobular septal thickening, findings are likely due to pulmonary edema. 3. Peribronchovascular right middle lobe consolidation, likely due to infection or aspiration. Recommend follow-up chest CT in 3 months to ensure resolution 4. Small right and moderate left pleural effusions. 5. Dilated ascending thoracic aorta, measuring up to 4.4 cm, unchanged when compared with the prior exam. Recommend annual imaging followup by CTA or MRA. This recommendation follows 2010 ACCF/AHA/AATS/ACR/ASA/SCA/SCAI/SIR/STS/SVM Guidelines for the Diagnosis and Management of Patients with Thoracic Aortic Disease. Circulation. 2010; 121:: [id number] Aortic aneurysm NOS (ICD10-I71.9) 6. Cardiomegaly and aortic Atherosclerosis (ICD10-I70.0). Electronically Signed   By: LYetta GlassmanM.D.   On: 07/05/2022 18:11   DG Chest 2 View  Result Date: 07/05/2022 CLINICAL DATA:  Shortness of breath. EXAM: CHEST - 2 VIEW COMPARISON:  March 27, 2021. FINDINGS: Stable cardiomediastinal silhouette. Left-sided defibrillator is unchanged in position. Mild bibasilar subsegmental atelectasis or edema is noted with small bilateral pleural effusions. Bony thorax is unremarkable. IMPRESSION: Mild bibasilar subsegmental atelectasis or edema is noted with small bilateral pleural effusions. Aortic Atherosclerosis (ICD10-I70.0). Electronically Signed   By: JMarijo ConceptionM.D.   On: 07/05/2022 15:34      Physical Examination: Vitals:   07/05/22 2130 07/05/22 2200 07/05/22 2230 07/06/22 0139  BP: 104/69 113/73 112/70 107/83  Pulse: 87 81 78 79  Temp:   97.9 F (36.6 C) 98 F (36.7 C)  Resp: 20 (!) 22 (!) 26 (!) 24  Height:      Weight:      SpO2: 95% 98% 98% 98%  TempSrc:   Oral Oral  BMI (Calculated):       Physical Exam Vitals and nursing note reviewed.   Constitutional:  General: He is not in acute distress.    Appearance: Normal appearance. He is not ill-appearing, toxic-appearing or diaphoretic.  HENT:     Head: Normocephalic and atraumatic.     Right Ear: Hearing and external ear normal.     Left Ear: Hearing and external ear normal.     Nose: Nose normal. No nasal deformity.     Mouth/Throat:     Lips: Pink.     Mouth: Mucous membranes are moist.     Tongue: No lesions.     Pharynx: Oropharynx is clear.  Eyes:     Extraocular Movements: Extraocular movements intact.  Neck:     Vascular: No carotid bruit.  Cardiovascular:     Rate and Rhythm: Normal rate and regular rhythm.     Pulses: Normal pulses.     Heart sounds: Normal heart sounds.  Pulmonary:     Effort: Pulmonary effort is normal.     Breath sounds: Examination of the right-middle field reveals rales. Examination of the left-middle field reveals rales. Examination of the right-lower field reveals rales. Examination of the left-lower field reveals rales. Rales present.  Abdominal:     General: Bowel sounds are normal. There is no distension.     Palpations: Abdomen is soft. There is no mass.     Tenderness: There is no abdominal tenderness. There is no guarding.     Hernia: No hernia is present.  Musculoskeletal:     Right lower leg: No edema.     Left lower leg: No edema.  Skin:    General: Skin is warm.  Neurological:     General: No focal deficit present.     Mental Status: He is alert and oriented to person, place, and time.     Cranial Nerves: Cranial nerves 2-12 are intact.     Motor: Motor function is intact.  Psychiatric:        Attention and Perception: Attention normal.        Mood and Affect: Mood normal.        Speech: Speech normal.        Behavior: Behavior normal. Behavior is cooperative.        Cognition and Memory: Cognition normal.     Assessment and Plan: * SOB (shortness of breath) Pt c/o of SOB and has rales on exam.   Pulse  oximetry check and Supplemental oxygen as needed for goal o2 sat of 90% and above.    Community acquired pneumonia of right lung Continue rocephin.  Acute on chronic systolic heart failure (HCC) Strict I/O Lasix prn.    Stage 3a chronic kidney disease (HCC) Lab Results  Component Value Date   CREATININE 1.39 (H) 07/05/2022   CREATININE 1.35 (H) 07/05/2022   CREATININE 1.60 (H) 12/03/2015  Stable avoid nephrotoxic agents and meds.  Renally dose meds.   Atrial fibrillation (HCC) Continue amiodarone and lasix as scheduled.  We will monitor  I/O/weights and creatinine and electrolytes.  Cont coumadin .  Hypothyroidism Pt has abnormal thyroid function test. We will increase his thyroid dose do next level.     DVT prophylaxis:  Coumadin    Code Status:  Full Code    Family Communication:  Errol, Ala (Daughter)  [phone]    Disposition Plan:  Home    Consults called:  None.  Admission status: Observation.    Unit/ Expected LOS: Med tele / 2 days.    Para Skeans MD [facility]  6 PM- 2 AM.  Please contact me via secure Chat 6 PM-2 AM. [phone] ( Pager ) To contact the [facility] Attending or Consulting provider [place] or covering provider during after hours [place], for this patient.   Check the care team in [facility] and look for a) attending/consulting [facility] provider listed and b) the [facility] team listed Log into [email or web address] and use [place]'s universal password to access. If you do not have the password, please contact the hospital operator. Locate the [facility] provider you are looking for under [facility] and page to a number that you can be directly reached. If you still have difficulty reaching the provider, please page the [facility] (Director on Call) for the Hospitalists listed on amion for assistance. [email or web address] 07/06/2022, 1:58 AM

## 2022-07-06 ENCOUNTER — Encounter: Payer: Self-pay | Admitting: Internal Medicine

## 2022-07-06 DIAGNOSIS — R0602 Shortness of breath: Secondary | ICD-10-CM | POA: Diagnosis not present

## 2022-07-06 DIAGNOSIS — E039 Hypothyroidism, unspecified: Secondary | ICD-10-CM | POA: Diagnosis present

## 2022-07-06 DIAGNOSIS — J189 Pneumonia, unspecified organism: Secondary | ICD-10-CM | POA: Diagnosis present

## 2022-07-06 LAB — CBG MONITORING, ED
Glucose-Capillary: 90 mg/dL (ref 70–99)
Glucose-Capillary: 92 mg/dL (ref 70–99)

## 2022-07-06 MED ORDER — FUROSEMIDE 20 MG PO TABS: 20.0000 mg | ORAL_TABLET | Freq: Every day | ORAL | 0 refills | Status: DC

## 2022-07-06 MED ORDER — LEVOTHYROXINE SODIUM 112 MCG PO TABS
112.0000 ug | ORAL_TABLET | Freq: Every day | ORAL | Status: DC
  Administered 2022-07-06: 112 ug via ORAL
  Filled 2022-07-06: qty 1

## 2022-07-06 NOTE — Assessment & Plan Note (Signed)
Lab Results  Component Value Date   CREATININE 1.39 (H) 07/05/2022   CREATININE 1.35 (H) 07/05/2022   CREATININE 1.60 (H) 12/03/2015  Stable avoid nephrotoxic agents and meds.  Renally dose meds.

## 2022-07-06 NOTE — TOC Initial Note (Signed)
Transition of Care [facility]) - Initial/Assessment Note    Patient Details  Name: Jack Harris MRN: [id number] Date of Birth: 29-May-1934  Transition of Care [facility]) CM/SW Contact:    Roseanne Kaufman, RN Phone Number: 07/06/2022, 2:26 PM  Clinical Narrative:    Received TOC consult for HHRN, HHPT. Spoke with patient to offer choice patient will accept any hh agency that accepts his insurance. Due to holiday this RNCM not sure if [facility] agency will respond. Notified multiple [place] agencies ([facility], [facility], [place]) awaiting a response.   Transportation at discharge: Kieth Brightly (daughter)              [facility] will follow post discharge.    Barriers to Discharge: No Barriers Identified   Patient Goals and CMS Choice   CMS Medicare.gov Compare Post Acute Care list provided to:: Patient Choice offered to / list presented to : Patient  Expected Discharge Plan and Services           Expected Discharge Date: 07/06/22                                    Prior Living Arrangements/Services                       Activities of Daily Living Home Assistive Devices/Equipment: Eyeglasses ADL Screening (condition at time of admission) Patient's cognitive ability adequate to safely complete daily activities?: Yes Is the patient deaf or have difficulty hearing?: No Does the patient have difficulty seeing, even when wearing glasses/contacts?: No Does the patient have difficulty concentrating, remembering, or making decisions?: No Patient able to express need for assistance with ADLs?: Yes Does the patient have difficulty dressing or bathing?: Yes Independently performs ADLs?: No Communication: Independent Dressing (OT): Independent Grooming: Independent Feeding: Independent Bathing: Needs assistance Is this a change from baseline?: Pre-admission baseline Toileting: Needs assistance Is this a change from baseline?: Change from baseline, expected to last <3 days In/Out Bed: Needs  assistance Is this a change from baseline?: Change from baseline, expected to last <3 days Walks in Home: Needs assistance Is this a change from baseline?: Change from baseline, expected to last <3 days Does the patient have difficulty walking or climbing stairs?: Yes Weakness of Legs: Both Weakness of Arms/Hands: None  Permission Sought/Granted                  Emotional Assessment              Admission diagnosis:  SOB (shortness of breath) [R06.02] Patient Active Problem List   Diagnosis Date Noted   Community acquired pneumonia of right lung 07/06/2022   Hypothyroidism 07/06/2022   SOB (shortness of breath) 07/05/2022   Stage 3a chronic kidney disease ([place]) 09/20/2020   Atrial fibrillation ([place]) 02/16/2014   Automatic implantable cardioverter-defibrillator in situ 02/16/2014   Acute on chronic systolic heart failure ([place]) 02/16/2014   Cardiomyopathy ([place]) 02/16/2014   PCP:  Baxter Hire, MD Pharmacy:   [id number] [place] [address] [id number]: [phone]: [phone]    Social Determinants of Health (SDOH) Interventions    Readmission Risk Interventions     No data to display

## 2022-07-06 NOTE — Assessment & Plan Note (Signed)
Continue rocephin.

## 2022-07-06 NOTE — Assessment & Plan Note (Signed)
Pt c/o of SOB and has rales on exam.   Pulse oximetry check and Supplemental oxygen as needed for goal o2 sat of 90% and above.

## 2022-07-06 NOTE — Assessment & Plan Note (Signed)
Strict I/O Lasix prn.

## 2022-07-06 NOTE — Evaluation (Addendum)
Physical Therapy Evaluation Patient Details Name: Jack Harris MRN: [id number] DOB: [birth year] Today's Date: 07/06/2022  History of Present Illness  88 yo male presents with SOB. Past medical history of atrial fibrillation, chronic systolic heart failure status post ICD placement, and aortic insufficiency  Clinical Impression  Pt found supine in be upon PT entry alert and oriented. Pt mod I with bed mobility. Sit<>Stand with RW and CGA. Pt ambulated to hallway bathroom and back to his room (252f) with RW and CGA. Pt ambualted 5 feet back to his bed with no UE support with no LOB noted throughout mobility session. Pt would benefit from skilled physical therapy to address the listed deficits (see below) to increase independence with ADLs and function. Current recommendation is HHPT to return pt to PLOF.          Recommendations for follow up therapy are one component of a multi-disciplinary discharge planning process, led by the attending physician.  Recommendations may be updated based on patient status, additional functional criteria and insurance authorization.  Follow Up Recommendations Home health PT      Assistance Recommended at Discharge Intermittent Supervision/Assistance  Patient can return home with the following  A little help with walking and/or transfers;A little help with bathing/dressing/bathroom;Assistance with cooking/housework;Assist for transportation;Help with stairs or ramp for entrance    Equipment Recommendations None recommended by PT  Recommendations for Other Services       Functional Status Assessment Patient has had a recent decline in their functional status and demonstrates the ability to make significant improvements in function in a reasonable and predictable amount of time.     Precautions / Restrictions Precautions Precautions: ICD/Pacemaker;Fall Restrictions Weight Bearing Restrictions: No      Mobility  Bed Mobility Overal bed mobility:  Modified Independent             General bed mobility comments: HOB elevated    Transfers Overall transfer level: Needs assistance Equipment used: Rolling walker (2 wheels) Transfers: Sit to/from Stand Sit to Stand: Min guard                Ambulation/Gait Ambulation/Gait assistance: Min guard Gait Distance (Feet): 200 Feet Assistive device: Rolling walker (2 wheels) Gait Pattern/deviations: WFL(Within Functional Limits)          Stairs            Wheelchair Mobility    Modified Rankin (Stroke Patients Only)       Balance Overall balance assessment: Needs assistance Sitting-balance support: Feet supported Sitting balance-Leahy Scale: Good     Standing balance support: Bilateral upper extremity supported Standing balance-Leahy Scale: Good Standing balance comment: can maintain static balance without UE support and ambulated 5 ft without RW                             Pertinent Vitals/Pain Pain Assessment Pain Assessment: No/denies pain    Home Living Family/patient expects to be discharged to:: Private residence Living Arrangements: Non-relatives/Friends;Spouse/significant other Available Help at Discharge: Friend(s);Available 24 hours/day Type of Home: Apartment Home Access: Stairs to enter Entrance Stairs-Rails: Right;Left;Can reach both Entrance Stairs-Number of Steps: 5   Home Layout: One level Home Equipment: RConservation officer, nature(2 wheels);Cane - single point;Shower seat      Prior Function Prior Level of Function : Independent/Modified Independent                     Hand Dominance  Extremity/Trunk Assessment   Upper Extremity Assessment Upper Extremity Assessment: Overall WFL for tasks assessed    Lower Extremity Assessment Lower Extremity Assessment: Overall WFL for tasks assessed    Cervical / Trunk Assessment Cervical / Trunk Assessment: Normal  Communication   Communication: No difficulties   Cognition Arousal/Alertness: Awake/alert Behavior During Therapy: WFL for tasks assessed/performed Overall Cognitive Status: Within Functional Limits for tasks assessed                                          General Comments      Exercises     Assessment/Plan    PT Assessment Patient needs continued PT services  PT Problem List Decreased strength;Decreased balance;Decreased cognition;Decreased mobility;Decreased knowledge of use of DME;Cardiopulmonary status limiting activity;Decreased activity tolerance       PT Treatment Interventions DME instruction;Functional mobility training;Balance training;Patient/family education;Gait training;Therapeutic activities;Neuromuscular re-education;Stair training;Therapeutic exercise    PT Goals (Current goals can be found in the Care Plan section)  Acute Rehab PT Goals Patient Stated Goal: to return home PT Goal Formulation: With patient Time For Goal Achievement: 07/20/22 Potential to Achieve Goals: Good    Frequency Min 2X/week     Co-evaluation               AM-PAC PT "6 Clicks" Mobility  Outcome Measure Help needed turning from your back to your side while in a flat bed without using bedrails?: None Help needed moving from lying on your back to sitting on the side of a flat bed without using bedrails?: None Help needed moving to and from a bed to a chair (including a wheelchair)?: None Help needed standing up from a chair using your arms (e.g., wheelchair or bedside chair)?: A Little Help needed to walk in hospital room?: A Little Help needed climbing 3-5 steps with a railing? : A Little 6 Click Score: 21    End of Session Equipment Utilized During Treatment: Gait belt Activity Tolerance: Patient tolerated treatment well Patient left: in bed;with bed alarm set;with call bell/phone within reach (with MD) Nurse Communication: Mobility status PT Visit Diagnosis: Unsteadiness on feet (R26.81);Other  abnormalities of gait and mobility (R26.89)    Time: 1314-1340 PT Time Calculation (min) (ACUTE ONLY): 26 min   Charges:   PT Evaluation $PT Eval Low Complexity: 1 Low PT Treatments $Therapeutic Activity: 8-22 mins        [employer], SPT  07/06/2022, 2:55 PM

## 2022-07-06 NOTE — Assessment & Plan Note (Addendum)
Continue amiodarone and lasix as scheduled.  We will monitor  I/O/weights and creatinine and electrolytes.  Cont coumadin .

## 2022-07-06 NOTE — Assessment & Plan Note (Addendum)
Pt has abnormal thyroid function test. Attribute to amiodarone. We will increase his thyroid dose do next level.

## 2022-07-06 NOTE — Discharge Summary (Signed)
Physician Discharge Summary   Patient: Jack Harris MRN: [id number] DOB: 1934/07/01  Admit date:     07/05/2022  Discharge date: 07/06/22  Discharge Physician: Fritzi Mandes   PCP: Baxter Hire, MD   Recommendations at discharge:   patient will follow-up with cardiology Dr. Saralyn Pilar next week.  Discharge Diagnoses: CHF acute on chronic systolic  Hospital Course: Jack Harris patient is a 88 year old Caucasian-year-old man with past medical history of paroxysmal atrial fibrillation, idiopathic chronic systolic congestive heart failure with EF of 25 to 30%, hyperlipidemia, hypothyroidism comes emergency room with increasing shortness of breath and difficulty completing sentence without getting short winded. Denies any chest pain cough fever. Complains of generalized weakness. Lives at home and takes care of wife was Alzheimer's dementia. Patient was found to have congestive heart failure acute on chronic. There was a question of consultation/pneumonia. Clinically patient does not seem to pneumonia. Calcitonin negative, white count normal, BNP elevated to 700. Received IV Lasix doses tend to diuresis well. Patient ambulated well in the hospital. Oxygen saturation remains stable. Will discharge patient on Lasix 20 mg PO daily for seven days. Patient will follow-up with Dr. Josefa Half as outpatient. Rest of cardiac meds will have been resumed. Patient worked with physical therapy. Home health PT will be arranged.  Discharge plan was discussed with patient's daughter Desmond Lope.(Permission obtained from patient)       Consultants: none Procedures performed:   Disposition: Home health Diet recommendation:  Discharge Diet Orders (From admission, onward)     Start     Ordered   07/06/22 0000  Diet - low sodium heart healthy        07/06/22 1354           Cardiac diet DISCHARGE MEDICATION: Allergies as of 07/06/2022       Reactions   Ciprofloxacin Hives   Levaquin  [levofloxacin In D5w] Hives        Medication List     STOP taking these medications    cephALEXin 250 MG capsule Commonly known as: Keflex   meloxicam 15 MG tablet Commonly known as: MOBIC       TAKE these medications    amiodarone 200 MG tablet Commonly known as: PACERONE Take 200 mg by mouth every evening.   atorvastatin 10 MG tablet Commonly known as: LIPITOR Take 10 mg by mouth daily at 6 PM.   cyanocobalamin 1000 MCG tablet Commonly known as: VITAMIN B12 Take 1,000 mcg by mouth daily.   escitalopram 5 MG tablet Commonly known as: LEXAPRO Take 5 mg by mouth daily.   furosemide 20 MG tablet Commonly known as: Lasix Take 1 tablet (20 mg total) by mouth daily for 7 days. Start taking on: July 07, 2022   levothyroxine 100 MCG tablet Commonly known as: SYNTHROID Take 100 mcg by mouth daily before breakfast.   metoprolol succinate 50 MG 24 hr tablet Commonly known as: TOPROL-XL Take 12.5 mg by mouth every evening. Take with or immediately following a meal.   midodrine 10 MG tablet Commonly known as: PROAMATINE Take 10 mg by mouth 3 (three) times daily.   tamsulosin 0.4 MG Caps capsule Commonly known as: FLOMAX Take 0.4 mg by mouth daily after supper.   warfarin 2.5 MG tablet Commonly known as: COUMADIN Take 2.5 mg by mouth every evening.        Discharge Exam: Filed Weights   07/05/22 1510  Weight: 64.9 kg   Cardiovascular both heart sounds are normal rate rhythm normal.  Respiratory clear to auscultation. Decreased breath on basis. No respiratory distress wheezing. Alert oriented times three. Skin warm and dry  Condition at discharge: fair  The results of significant diagnostics from this hospitalization (including imaging, microbiology, ancillary and laboratory) are listed below for reference.   Imaging Studies: CT Angio Chest PE W and/or Wo Contrast  Result Date: 07/05/2022 CLINICAL DATA:  Shortness of breath EXAM: CT  ANGIOGRAPHY CHEST WITH CONTRAST TECHNIQUE: Multidetector CT imaging of the chest was performed using the standard protocol during bolus administration of intravenous contrast. Multiplanar CT image reconstructions and MIPs were obtained to evaluate the vascular anatomy. RADIATION DOSE REDUCTION: This exam was performed according to the departmental dose-optimization program which includes automated exposure control, adjustment of the mA and/or kV according to patient size and/or use of iterative reconstruction technique. CONTRAST:  35m OMNIPAQUE IOHEXOL 350 MG/ML SOLN COMPARISON:  Chest CT dated April 03, 2016 FINDINGS: Cardiovascular: No evidence of pulmonary embolus. Cardiomegaly. Trace pericardial effusion. Left chest wall AICD with leads in the right atrium and right ventricle. Dilated ascending thoracic aorta, measuring up to 4.4 cm, unchanged when compared with the prior exam. Severe calcified plaque of the thoracic aorta. Moderate coronary artery calcifications. Mediastinum/Nodes: Small hiatal hernia. No pathologically enlarged lymph nodes seen in the chest. Lungs/Pleura: Central airways are patent central predominant patchy ground-glass opacities with associated interlobular septal thickening. Peribronchovascular right middle lobe consolidation seen on series 5, image 67. Left lower lobe atelectasis. Small right and moderate left pleural effusions no evidence of pneumothorax Upper Abdomen: Low-attenuation left renal lesions which are likely simple cysts, no specific follow-up imaging is recommended. No acute abnormality. Musculoskeletal: No chest wall abnormality. No acute or significant osseous findings. Review of the MIP images confirms the above findings. IMPRESSION: 1. No evidence of pulmonary embolus. 2. Central predominant patchy ground-glass opacities with associated interlobular septal thickening, findings are likely due to pulmonary edema. 3. Peribronchovascular right middle lobe consolidation,  likely due to infection or aspiration. Recommend follow-up chest CT in 3 months to ensure resolution 4. Small right and moderate left pleural effusions. 5. Dilated ascending thoracic aorta, measuring up to 4.4 cm, unchanged when compared with the prior exam. Recommend annual imaging followup by CTA or MRA. This recommendation follows 2010 ACCF/AHA/AATS/ACR/ASA/SCA/SCAI/SIR/STS/SVM Guidelines for the Diagnosis and Management of Patients with Thoracic Aortic Disease. Circulation. 2010; 121:: G818-H631 Aortic aneurysm NOS (ICD10-I71.9) 6. Cardiomegaly and aortic Atherosclerosis (ICD10-I70.0). Electronically Signed   By: LYetta GlassmanM.D.   On: 07/05/2022 18:11   DG Chest 2 View  Result Date: 07/05/2022 CLINICAL DATA:  Shortness of breath. EXAM: CHEST - 2 VIEW COMPARISON:  March 27, 2021. FINDINGS: Stable cardiomediastinal silhouette. Left-sided defibrillator is unchanged in position. Mild bibasilar subsegmental atelectasis or edema is noted with small bilateral pleural effusions. Bony thorax is unremarkable. IMPRESSION: Mild bibasilar subsegmental atelectasis or edema is noted with small bilateral pleural effusions. Aortic Atherosclerosis (ICD10-I70.0). Electronically Signed   By: JMarijo ConceptionM.D.   On: 07/05/2022 15:34    Microbiology: Results for orders placed or performed during the hospital encounter of 04/18/19  SARS CORONAVIRUS 2 (TAT 6-24 HRS) Nasopharyngeal Nasopharyngeal Swab     Status: None   Collection Time: 04/18/19  1:39 PM   Specimen: Nasopharyngeal Swab  Result Value Ref Range Status   SARS Coronavirus 2 NEGATIVE NEGATIVE Final    Comment: (NOTE) SARS-CoV-2 target nucleic acids are NOT DETECTED. The SARS-CoV-2 RNA is generally detectable in upper and lower respiratory specimens during the acute phase of  infection. Negative results do not preclude SARS-CoV-2 infection, do not rule out co-infections with other pathogens, and should not be used as the sole basis for treatment  or other patient management decisions. Negative results must be combined with clinical observations, patient history, and epidemiological information. The expected result is Negative. Fact Sheet for Patients: SugarRoll.be Fact Sheet for Healthcare Providers: https://www.woods-mathews.com/ This test is not yet approved or cleared by the [place] FDA and  has been authorized for detection and/or diagnosis of SARS-CoV-2 by FDA under an Emergency Use Authorization (EUA). This EUA will remain  in effect (meaning this test can be used) for the duration of the COVID-19 declaration under Section 56 4(b)(1) of the Act, 21 U.S.C. section 360bbb-3(b)(1), unless the authorization is terminated or revoked sooner. Performed at [facility], [place] [address] [place], [place] [address]     Labs: CBC: Recent Labs  Lab 07/05/22 1514 07/05/22 2156  WBC 5.2 4.2  HGB 12.5* 12.3*  HCT 39.3 38.4*  MCV 91.2 89.9  PLT 356 155   Basic Metabolic Panel: Recent Labs  Lab 07/05/22 1514 07/05/22 2156  NA 136  --   K 4.4  --   CL 103  --   CO2 24  --   GLUCOSE 129*  --   BUN 20  --   CREATININE 1.35* 1.39*  CALCIUM 8.8*  --    Liver Function Tests: No results for input(s): "AST", "ALT", "ALKPHOS", "BILITOT", "PROT", "ALBUMIN" in the last 168 hours. CBG: Recent Labs  Lab 07/05/22 2159 07/06/22 0750 07/06/22 1200  GLUCAP 90 90 92    Discharge time spent: greater than 30 minutes.  Signed: Fritzi Mandes, MD [facility] 07/06/2022

## 2022-07-07 LAB — HEMOGLOBIN A1C
Hgb A1c MFr Bld: 5.8 % — ABNORMAL HIGH (ref 4.8–5.6)
Mean Plasma Glucose: 120 mg/dL

## 2022-07-10 ENCOUNTER — Telehealth: Payer: Self-pay

## 2022-07-10 NOTE — Telephone Encounter (Signed)
Late documentation: Spoke with Malachy Mood at [facility] who will follow the patient for [facility] for CHF and HHPT.  No additional TOC needs at this time.

## 2022-09-22 ENCOUNTER — Other Ambulatory Visit: Payer: Self-pay | Admitting: Internal Medicine

## 2022-09-22 DIAGNOSIS — Z0000 Encounter for general adult medical examination without abnormal findings: Secondary | ICD-10-CM

## 2022-09-22 DIAGNOSIS — R634 Abnormal weight loss: Secondary | ICD-10-CM

## 2022-10-02 ENCOUNTER — Ambulatory Visit
Admission: RE | Admit: 2022-10-02 | Discharge: 2022-10-02 | Disposition: A | Discharge status: Home / Self Care | Payer: Medicare Other | Source: Ambulatory Visit | Attending: Internal Medicine | Admitting: Internal Medicine

## 2022-10-02 DIAGNOSIS — Z95810 Presence of automatic (implantable) cardiac defibrillator: Secondary | ICD-10-CM | POA: Insufficient documentation

## 2022-10-02 DIAGNOSIS — I7121 Aneurysm of the ascending aorta, without rupture: Secondary | ICD-10-CM | POA: Insufficient documentation

## 2022-10-02 DIAGNOSIS — K5750 Diverticulosis of both small and large intestine without perforation or abscess without bleeding: Secondary | ICD-10-CM | POA: Diagnosis not present

## 2022-10-02 DIAGNOSIS — I517 Cardiomegaly: Secondary | ICD-10-CM | POA: Diagnosis not present

## 2022-10-02 DIAGNOSIS — Z0000 Encounter for general adult medical examination without abnormal findings: Secondary | ICD-10-CM | POA: Diagnosis present

## 2022-10-02 DIAGNOSIS — I700 Atherosclerosis of aorta: Secondary | ICD-10-CM | POA: Diagnosis not present

## 2022-10-02 DIAGNOSIS — R634 Abnormal weight loss: Secondary | ICD-10-CM

## 2022-10-02 DIAGNOSIS — I2510 Atherosclerotic heart disease of native coronary artery without angina pectoris: Secondary | ICD-10-CM | POA: Diagnosis not present

## 2022-10-02 MED ORDER — IOHEXOL 300 MG/ML  SOLN
100.0000 mL | Freq: Once | INTRAMUSCULAR | Status: AC | PRN
  Administered 2022-10-02: 100 mL via INTRAVENOUS

## 2023-01-26 ENCOUNTER — Encounter: Payer: Self-pay | Admitting: Oncology

## 2023-01-26 ENCOUNTER — Inpatient Hospital Stay: Payer: Medicare Other

## 2023-01-26 ENCOUNTER — Inpatient Hospital Stay: Payer: Medicare Other | Attending: Oncology | Admitting: Oncology

## 2023-01-26 VITALS — BP 121/60 | HR 75 | Temp 96.0°F | Resp 18 | Wt 128.3 lb

## 2023-01-26 DIAGNOSIS — D649 Anemia, unspecified: Secondary | ICD-10-CM | POA: Diagnosis not present

## 2023-01-26 DIAGNOSIS — R634 Abnormal weight loss: Secondary | ICD-10-CM | POA: Diagnosis present

## 2023-01-26 DIAGNOSIS — F17290 Nicotine dependence, other tobacco product, uncomplicated: Secondary | ICD-10-CM | POA: Insufficient documentation

## 2023-01-26 DIAGNOSIS — Z8551 Personal history of malignant neoplasm of bladder: Secondary | ICD-10-CM

## 2023-01-26 LAB — FOLATE: Folate: 10 ng/mL (ref >5.9)

## 2023-01-26 LAB — COMPREHENSIVE METABOLIC PANEL
ALT: 20 U/L (ref 0–44)
AST: 31 U/L (ref 15–41)
Albumin: 4.7 g/dL (ref 3.5–5.0)
Alkaline Phosphatase: 65 U/L (ref 38–126)
Anion gap: 9 (ref 5–15)
BUN: 27 mg/dL — ABNORMAL HIGH (ref 8–23)
CO2: 27 mmol/L (ref 22–32)
Calcium: 9.5 mg/dL (ref 8.9–10.3)
Chloride: 99 mmol/L (ref 98–111)
Creatinine, Ser: 1.13 mg/dL (ref 0.61–1.24)
GFR, Estimated: >60 mL/min (ref >60)
Glucose, Bld: 80 mg/dL (ref 70–99)
Potassium: 4.4 mmol/L (ref 3.5–5.1)
Sodium: 135 mmol/L (ref 135–145)
Total Bilirubin: 0.9 mg/dL (ref 0.3–1.2)
Total Protein: 8.3 g/dL — ABNORMAL HIGH (ref 6.5–8.1)

## 2023-01-26 LAB — RETIC PANEL
Immature Retic Fract: 6.9 % (ref 2.3–15.9)
RBC.: 5.05 MIL/uL (ref 4.22–5.81)
Retic Count, Absolute: 24.2 10*3/uL (ref 19.0–186.0)
Retic Ct Pct: 0.5 % (ref 0.4–3.1)
Reticulocyte Hemoglobin: 31.5 pg (ref >27.9)

## 2023-01-26 LAB — CBC WITH DIFFERENTIAL/PLATELET
Abs Immature Granulocytes: 0.03 10*3/uL (ref 0.00–0.07)
Basophils Absolute: 0 10*3/uL (ref 0.0–0.1)
Basophils Relative: 1 %
Eosinophils Absolute: 0 10*3/uL (ref 0.0–0.5)
Eosinophils Relative: 1 %
HCT: 43.8 % (ref 39.0–52.0)
Hemoglobin: 13.9 g/dL (ref 13.0–17.0)
Immature Granulocytes: 1 %
Lymphocytes Relative: 18 %
Lymphs Abs: 0.6 10*3/uL — ABNORMAL LOW (ref 0.7–4.0)
MCH: 27.7 pg (ref 26.0–34.0)
MCHC: 31.7 g/dL (ref 30.0–36.0)
MCV: 87.3 fL (ref 80.0–100.0)
Monocytes Absolute: 0.5 10*3/uL (ref 0.1–1.0)
Monocytes Relative: 15 %
Neutro Abs: 2.3 10*3/uL (ref 1.7–7.7)
Neutrophils Relative %: 64 %
Platelets: 260 10*3/uL (ref 150–400)
RBC: 5.02 MIL/uL (ref 4.22–5.81)
RDW: 15.9 % — ABNORMAL HIGH (ref 11.5–15.5)
WBC: 3.5 10*3/uL — ABNORMAL LOW (ref 4.0–10.5)
nRBC: 0 % (ref 0.0–0.2)

## 2023-01-26 LAB — HIV ANTIBODY (ROUTINE TESTING W REFLEX): HIV Screen 4th Generation wRfx: NONREACTIVE

## 2023-01-26 LAB — IRON AND TIBC
Iron: 101 ug/dL (ref 45–182)
Saturation Ratios: 20 % (ref 17.9–39.5)
TIBC: 512 ug/dL — ABNORMAL HIGH (ref 250–450)
UIBC: 411 ug/dL

## 2023-01-26 LAB — VITAMIN B12: Vitamin B-12: 391 pg/mL (ref 180–914)

## 2023-01-26 LAB — HEPATITIS PANEL, ACUTE
HCV Ab: NONREACTIVE
Hep A IgM: NONREACTIVE
Hep B C IgM: NONREACTIVE
Hepatitis B Surface Ag: NONREACTIVE

## 2023-01-26 LAB — LACTATE DEHYDROGENASE: LDH: 203 U/L — ABNORMAL HIGH (ref 98–192)

## 2023-01-26 LAB — TSH: TSH: 12.267 u[IU]/mL — ABNORMAL HIGH (ref 0.350–4.500)

## 2023-01-26 LAB — FERRITIN: Ferritin: 37 ng/mL (ref 24–336)

## 2023-01-26 NOTE — Assessment & Plan Note (Signed)
Recommend cessation. ?

## 2023-01-26 NOTE — Assessment & Plan Note (Addendum)
Feb 2024 CT showed no suspicion of cancer. Physical examination did not reveal any organomegaly or lymphadenopathy He has chronic respiratory failure, and pulmonary cachexia may contribute to his weight loss.  He also does not eat well which may lead to weight loss. Refer to nutritionist.  Check cbc cmp, SPEP, flowcytometry, LDH, TSH, B12, Folate, hepatitis panel, HIV

## 2023-01-26 NOTE — Assessment & Plan Note (Signed)
He has lost follow up with urology CT showed thicken bladder wall.  Refer to urology.

## 2023-01-26 NOTE — Assessment & Plan Note (Signed)
Pending above work up 

## 2023-01-26 NOTE — Progress Notes (Signed)
Hematology/Oncology Consult Note [phone] [phone]     REFERRING PROVIDER: Gracelyn Nurse, MD    CHIEF COMPLAINTS/PURPOSE OF CONSULTATION:  Unintentional weight loss.   ASSESSMENT & PLAN:   Weight loss Feb 2024 CT showed no suspicion of cancer. Physical examination did not reveal any organomegaly or lymphadenopathy He has chronic respiratory failure, and pulmonary cachexia may contribute to his weight loss.  He also does not eat well which may lead to weight loss. Refer to nutritionist.  Check cbc cmp, SPEP, flowcytometry, LDH, TSH, B12, Folate, hepatitis panel, HIV  History of bladder cancer He has lost follow up with urology CT showed thicken bladder wall.  Refer to urology.   Cigar smoker Recommend cessation.   Normocytic anemia Pending above work up   Orders Placed This Encounter  Procedures   Vitamin B12    Standing Status:   Future    Number of Occurrences:   1    Standing Expiration Date:   01/26/2024   Folate    Standing Status:   Future    Number of Occurrences:   1    Standing Expiration Date:   01/26/2024   CBC with Differential/Platelet    Standing Status:   Future    Number of Occurrences:   1    Standing Expiration Date:   01/26/2024   Comprehensive metabolic panel    Standing Status:   Future    Number of Occurrences:   1    Standing Expiration Date:   01/26/2024   Multiple Myeloma Panel (SPEP&IFE w/QIG)    Standing Status:   Future    Number of Occurrences:   1    Standing Expiration Date:   01/26/2024   Kappa/lambda light chains    Standing Status:   Future    Number of Occurrences:   1    Standing Expiration Date:   01/26/2024   Flow cytometry panel-leukemia/lymphoma work-up    Standing Status:   Future    Number of Occurrences:   1    Standing Expiration Date:   01/26/2024   Lactate dehydrogenase    Standing Status:   Future    Number of Occurrences:   1    Standing Expiration Date:   01/26/2024   TSH     Standing Status:   Future    Number of Occurrences:   1    Standing Expiration Date:   01/26/2024   Hepatitis panel, acute    Standing Status:   Future    Number of Occurrences:   1    Standing Expiration Date:   01/26/2024   HIV Antibody (routine testing w rflx)    Standing Status:   Future    Number of Occurrences:   1    Standing Expiration Date:   01/26/2024   Ferritin    Standing Status:   Future    Number of Occurrences:   1    Standing Expiration Date:   07/28/2023   Iron and TIBC    Standing Status:   Future    Number of Occurrences:   1    Standing Expiration Date:   01/26/2024   Retic Panel    Standing Status:   Future    Number of Occurrences:   1    Standing Expiration Date:   01/26/2024   Ambulatory referral to Urology    Referral Priority:   Routine    Referral Type:   Consultation    Referral Reason:  Specialty Services Required    Referred to Provider:   Riki Altes, MD    Requested Specialty:   Urology    Number of Visits Requested:   1   Follow up 3-4 weeks to review results.  All questions were answered. The patient knows to call the clinic with any problems, questions or concerns.  [name], MD, PhD [facility] Hematology Oncology 01/26/2023    HISTORY OF PRESENTING ILLNESS:  Jack Harris 89 y.o. male presents to establish care for unintentional weight loss.   Patient was accompanied by care giver. He has noticed weight loss, per patient and care giver, he dropped from 135 pounds to 125 pounds in the past 2-3 months.  I reviewed recorded weight documented during his previous medical visits.  09/15/2022 132Ib 07/21/2022 135 Ib 07/14/2023 134 Ib  He does not eat much, care give says " he eats to live, if he is not hungry, he does not eat".  He has normal regular formed bowel movement, no blood in stool.  He is a "casual cigar smoker", chronic hoarseness after a remote throat surgery.  + fatigue, unsteady gait. His pcp discontinued his statin 2  weeks ago.  Denies  fever, chills, fatigue, night sweats. Denies dysphagia, mouth sore.   + Chronic respiratory failure, OSA, on Oxygen PRN. - he sees. Dr. Karna Christmas.   09/22/22 CT chest abdomen pelvis w contrast showed 1. No convincing evidence of malignancy within the chest, abdomen or pelvis. 2. Mild prominence of the biliary tree with the common duct measuring 9 mm, minimally greater than expected for patient's age. Large periampullary duodenal diverticulum which narrows the distal common duct near the ampulla. Constellation of findings are most compatible with senescent change and back pressure from the large periampullary duodenal diverticulum. Recommend correlation with LFTs and consider further evaluation with MRCP if clinical signs/symptoms of biliary obstruction 3. Colonic diverticulosis without findings of acute diverticulitis. 4. Moderate volume of formed stool in the colon. Correlate for constipation. 5. Symmetric wall thickening of the urinary bladder, which may be secondary to chronic outflow impedance or cystitis. 6. Unchanged aneurysmal dilation of the ascending thoracic aorta measuring 4.4 cm, Recommend annual imaging followup by CTA or MRA.This recommendation follows 2010 ACCF/AHA/AATS/ACR/ASA/SCA/SCAI/SIR/STS/SVM Guidelines for the Diagnosis and Management of Patients with Thoracic Aortic Disease. Circulation. 2010; 121: Z610-R604. Aortic aneurysm NOS (ICD10-I71.9) 7.  Aortic Atherosclerosis   MEDICAL HISTORY:  Past Medical History:  Diagnosis Date   AICD (automatic cardioverter/defibrillator) present    [employer]   Aortic insufficiency    Asthma    childhood asthma   Atrial fibrillation (HCC)    Cancer (HCC)    bladder, 10 yrs ago   Cancer (HCC)    squamous cell carcinoma   Cardiomyopathy (HCC)    CHF (congestive heart failure) (HCC)    Hyperlipidemia    Hypothyroidism    Paralysis of vocal cords and larynx, unilateral    Shortness of breath dyspnea      SURGICAL HISTORY: Past Surgical History:  Procedure Laterality Date   BLADDER SURGERY     Surgeon:Dr. Lonna Cobb, Location [facility]    CARDIAC DEFIBRILLATOR PLACEMENT     [facility]   EYE SURGERY Bilateral    Cataract Extraction with IOL   IMPLANTABLE CARDIOVERTER DEFIBRILLATOR (ICD) GENERATOR CHANGE Left 04/23/2019   Procedure: ICD GENERATOR CHANGE;  Surgeon: Marcina Millard, MD;  Location: [facility] ORS;  Service: Cardiovascular;  Laterality: Left;   THYROPLASTY Left 03/02/2016   Procedure: THYROPLASTY;  Surgeon:  Linus Salmons, MD;  Location: [facility] ORS;  Service: ENT;  Laterality: Left;    SOCIAL HISTORY: Social History   Socioeconomic History   Marital status: Married    Spouse name: Not on file   Number of children: Not on file   Years of education: Not on file   Highest education level: Not on file  Occupational History   Not on file  Tobacco Use   Smoking status: Never   Smokeless tobacco: Former    Types: Chew  Substance and Sexual Activity   Alcohol use: Not Currently    Comment: occ   Drug use: No   Sexual activity: Not on file  Other Topics Concern   Not on file  Social History Narrative   Not on file   Social Determinants of Health   Financial Resource Strain: Low Risk  (01/26/2023)   Overall Financial Resource Strain (CARDIA)    Difficulty of Paying Living Expenses: Not very hard  Food Insecurity: No Food Insecurity (07/06/2022)   Hunger Vital Sign    Worried About Running Out of Food in the Last Year: Never true    Ran Out of Food in the Last Year: Never true  Transportation Needs: No Transportation Needs (07/06/2022)   PRAPARE - Administrator, Civil Service (Medical): No    Lack of Transportation (Non-Medical): No  Physical Activity: Not on file  Stress: No Stress Concern Present (01/26/2023)   [employer] of Occupational Health - Occupational Stress Questionnaire    Feeling of Stress : Only a little  Recent Concern: Stress  - Stress Concern Present (01/26/2023)   [employer] of Occupational Health - Occupational Stress Questionnaire    Feeling of Stress : To some extent  Social Connections: Not on file  Intimate Partner Violence: Not At Risk (07/06/2022)   Humiliation, Afraid, Rape, and Kick questionnaire    Fear of Current or Ex-Partner: No    Emotionally Abused: No    Physically Abused: No    Sexually Abused: No    FAMILY HISTORY: Family History  Problem Relation Age of Onset   Diabetes Maternal Grandmother     ALLERGIES:  is allergic to ciprofloxacin and levaquin [levofloxacin in d5w].  MEDICATIONS:  Current Outpatient Medications  Medication Sig Dispense Refill   amiodarone (PACERONE) 200 MG tablet Take 200 mg by mouth every evening.      atorvastatin (LIPITOR) 10 MG tablet Take 10 mg by mouth daily at 6 PM.     escitalopram (LEXAPRO) 5 MG tablet Take 5 mg by mouth daily.     levothyroxine (SYNTHROID) 100 MCG tablet Take 100 mcg by mouth daily before breakfast.     metoprolol succinate (TOPROL-XL) 50 MG 24 hr tablet Take 12.5 mg by mouth every evening. Take with or immediately following a meal.     midodrine (PROAMATINE) 10 MG tablet Take 10 mg by mouth 3 (three) times daily.     vitamin B-12 (CYANOCOBALAMIN) 1000 MCG tablet Take 1,000 mcg by mouth daily.     warfarin (COUMADIN) 2.5 MG tablet Take 2.5 mg by mouth every evening.      furosemide (LASIX) 20 MG tablet Take 1 tablet (20 mg total) by mouth daily for 7 days. (Patient not taking: Reported on 01/26/2023) 7 tablet 0   tamsulosin (FLOMAX) 0.4 MG CAPS capsule Take 0.4 mg by mouth daily after supper. (Patient not taking: Reported on 01/26/2023)     No current facility-administered medications for this visit.  Review of Systems  Constitutional:  Positive for fatigue and unexpected weight change. Negative for appetite change, chills and fever.  HENT:   Negative for hearing loss and voice change.   Eyes:  Negative for eye problems and  icterus.  Respiratory:  Positive for shortness of breath. Negative for chest tightness and cough.   Cardiovascular:  Negative for chest pain and leg swelling.  Gastrointestinal:  Negative for abdominal distention and abdominal pain.  Endocrine: Negative for hot flashes.  Genitourinary:  Negative for difficulty urinating, dysuria and frequency.   Musculoskeletal:  Positive for gait problem. Negative for arthralgias.  Skin:  Negative for itching and rash.  Neurological:  Positive for gait problem. Negative for light-headedness and numbness.  Hematological:  Negative for adenopathy. Does not bruise/bleed easily.  Psychiatric/Behavioral:  Negative for confusion.      PHYSICAL EXAMINATION: ECOG PERFORMANCE STATUS: 1 - Symptomatic but completely ambulatory  Vitals:   01/26/23 1111  BP: 121/60  Pulse: 75  Resp: 18  Temp: (!) 96 F (35.6 C)  SpO2: 100%   Filed Weights   01/26/23 1111  Weight: 128 lb 4.8 oz (58.2 kg)    Physical Exam Constitutional:      General: He is not in acute distress.    Appearance: He is not diaphoretic.     Comments: Thin built  HENT:     Head: Normocephalic and atraumatic.     Nose: Nose normal.     Mouth/Throat:     Pharynx: No oropharyngeal exudate.  Eyes:     General: No scleral icterus.    Pupils: Pupils are equal, round, and reactive to light.  Cardiovascular:     Rate and Rhythm: Normal rate and regular rhythm.     Heart sounds: No murmur heard. Pulmonary:     Effort: Pulmonary effort is normal. No respiratory distress.     Breath sounds: No rales.  Chest:     Chest wall: No tenderness.  Abdominal:     General: There is no distension.     Palpations: Abdomen is soft.     Tenderness: There is no abdominal tenderness.  Musculoskeletal:        General: Normal range of motion.     Cervical back: Normal range of motion and neck supple.  Lymphadenopathy:     Cervical: No cervical adenopathy.  Skin:    General: Skin is warm and dry.      Findings: No erythema.  Neurological:     Mental Status: He is alert and oriented to person, place, and time.     Cranial Nerves: No cranial nerve deficit.     Motor: No abnormal muscle tone.     Coordination: Coordination normal.  Psychiatric:        Mood and Affect: Mood and affect normal.      LABORATORY DATA:  I have reviewed the data as listed    Latest Ref Rng & Units 01/26/2023   12:06 PM 07/05/2022    9:56 PM 07/05/2022    3:14 PM  CBC  WBC 4.0 - 10.5 K/uL 3.5  4.2  5.2   Hemoglobin 13.0 - 17.0 g/dL 40.9  81.1  91.4   Hematocrit 39.0 - 52.0 % 43.8  38.4  39.3   Platelets 150 - 400 K/uL 260  359  356       Latest Ref Rng & Units 01/26/2023   12:06 PM 07/05/2022    9:56 PM 07/05/2022    3:14 PM  CMP  Glucose 70 - 99 mg/dL 80   161   BUN 8 - 23 mg/dL 27   20   Creatinine 0.96 - 1.24 mg/dL 0.45  4.09  8.11   Sodium 135 - 145 mmol/L 135   136   Potassium 3.5 - 5.1 mmol/L 4.4   4.4   Chloride 98 - 111 mmol/L 99   103   CO2 22 - 32 mmol/L 27   24   Calcium 8.9 - 10.3 mg/dL 9.5   8.8   Total Protein 6.5 - 8.1 g/dL 8.3     Total Bilirubin 0.3 - 1.2 mg/dL 0.9     Alkaline Phos 38 - 126 U/L 65     AST 15 - 41 U/L 31     ALT 0 - 44 U/L 20        RADIOGRAPHIC STUDIES: I have personally reviewed the radiological images as listed and agreed with the findings in the report. No results found.

## 2023-01-29 LAB — COMP PANEL: LEUKEMIA/LYMPHOMA

## 2023-01-29 LAB — KAPPA/LAMBDA LIGHT CHAINS
Kappa free light chain: 28.5 mg/L — ABNORMAL HIGH (ref 3.3–19.4)
Kappa, lambda light chain ratio: 1.75 — ABNORMAL HIGH (ref 0.26–1.65)
Lambda free light chains: 16.3 mg/L (ref 5.7–26.3)

## 2023-01-31 LAB — MULTIPLE MYELOMA PANEL, SERUM
Albumin SerPl Elph-Mcnc: 4.2 g/dL (ref 2.9–4.4)
Albumin/Glob SerPl: 1.3 (ref 0.7–1.7)
Alpha 1: 0.3 g/dL (ref 0.0–0.4)
Alpha2 Glob SerPl Elph-Mcnc: 1 g/dL (ref 0.4–1.0)
B-Globulin SerPl Elph-Mcnc: 1.2 g/dL (ref 0.7–1.3)
Gamma Glob SerPl Elph-Mcnc: 0.8 g/dL (ref 0.4–1.8)
Globulin, Total: 3.3 g/dL (ref 2.2–3.9)
IgA: 307 mg/dL (ref 61–437)
IgG (Immunoglobin G), Serum: 949 mg/dL (ref 603–1613)
IgM (Immunoglobulin M), Srm: 55 mg/dL (ref 15–143)
Total Protein ELP: 7.5 g/dL (ref 6.0–8.5)

## 2023-02-07 ENCOUNTER — Inpatient Hospital Stay: Payer: Medicare Other

## 2023-02-07 NOTE — Progress Notes (Signed)
Nutrition Assessment   Reason for Assessment:  Weight loss   ASSESSMENT:  89 year old male with history of bladder cancer.  Past medical history of cardiomyopathy, CHF, afib, stage 3 CKD, vocal cord paralysis (seen by SLP 09/2016), ICD, hypothyroidism.  Currently on surveillance.  Referral back to urology with CT thickening noted on CT scan per MD note.    Met with patient and caregiver Boyd Kerbs.  Boyd Kerbs lives with patient and wife who has dementia.  Patient states that in 06/2022 was hospitalized for pneumonia and lost significant amount of weight especially over the last few weeks.  Boyd Kerbs reports that patient typically eats a 1/2 of child's portion frequently during the day.  Patient says that if he is not hungry or does not like something, then will not eat.  Breakfast he usually has a cup of cereal with 1/2 banana, raisins and whole milk and coffee and honey.  Lunch yesterday and supper was beef with peas, carrots and drank milk.  Will snack on nuts, scoop of peanut butter, fruit, candy.  Drinks 1 premier protein shake daily often with oreo cookies (3).    Denies stomach upset, nausea, diarrhea, constipation, mouth pain, trouble chewing.  Reports 2-3 times a week will have food that feels like sticks in the back of the throat.  Will cough and/or drink liquid to get it to go down.  Does not vomit foods back up.  This does not prevent him from eating foods.  Was last seen by SLP 09/2016.      Nutrition Focused Physical Exam:   Orbital Region: moderate Buccal Region: severe Upper Arm Region: severe Thoracic and Lumbar Region: severe Temple Region: severe Clavicle Bone Region: severe Shoulder and Acromion Bone Region: severe Scapular Bone Region: severe Dorsal Hand: mild Patellar Region: moderate Anterior Thigh Region: severe Posterior Calf Region: mild Edema (RD assessment): none Hair:observed Eyes: observed Mouth: observed Skin: observed Nails: observed   Medications: Vit B 12,  coumadin   Labs: Fe 101, folate 10, vit B 12 391   Anthropometrics:   Height: 72 inches Weight: 127 lb UBW: 140-150 lb  Per chart 163 lb on 11/25/2020 143 lb on 07/05/22 BMI: 17  11% weight loss in the 7 months, concerning   Estimated Energy Needs  Kcals: 1700-2000 Protein: 85-100 g Fluid: 1700-2000 ml   NUTRITION DIAGNOSIS: Unintentional weight loss related to hospitalization in Nov 2023, chronic illness as evidenced by 11% weight loss in 7 months, severe muscle mass loss and severe fat loss   MALNUTRITION DIAGNOSIS: Patient meets criteria for severe malnutrition in context of chronic illness as evidenced by severe muscle mass and fat loss, 11% weight loss in 7 months.     INTERVENTION:  Patient may benefit from referral back to SLP for evaluation.  Patient wants to hold off at this time Recommend liberalize diet. Discussed ways to add calories and protein in diet.  High Calorie, High Protein diet handout provided to patient and caregiver.  Heart healthy ways discussed as well.  Recommend 350 + calorie shake at least daily.  Samples of ensure complete, boost VHC and unjury protein powder provided.  Discussed ways to add calories to shake Recommend MVI daily Encouraged keeping food diary Contact information provided   MONITORING, EVALUATION, GOAL: weight trends, intake   Next Visit: Wednesday, Aug 7 in clinic  Ronnel Zuercher B. Freida Busman, RD, LDN Registered Dietitian [phone]

## 2023-02-21 ENCOUNTER — Inpatient Hospital Stay: Payer: Medicare Other | Attending: Oncology | Admitting: Oncology

## 2023-02-21 ENCOUNTER — Encounter: Payer: Self-pay | Admitting: Oncology

## 2023-02-21 ENCOUNTER — Inpatient Hospital Stay: Payer: Medicare Other

## 2023-02-21 VITALS — BP 98/82 | HR 66 | Temp 96.9°F | Resp 16 | Wt 125.5 lb

## 2023-02-21 DIAGNOSIS — Z8551 Personal history of malignant neoplasm of bladder: Secondary | ICD-10-CM | POA: Insufficient documentation

## 2023-02-21 DIAGNOSIS — E039 Hypothyroidism, unspecified: Secondary | ICD-10-CM | POA: Diagnosis not present

## 2023-02-21 DIAGNOSIS — J9610 Chronic respiratory failure, unspecified whether with hypoxia or hypercapnia: Secondary | ICD-10-CM | POA: Insufficient documentation

## 2023-02-21 DIAGNOSIS — R634 Abnormal weight loss: Secondary | ICD-10-CM | POA: Insufficient documentation

## 2023-02-21 DIAGNOSIS — R7989 Other specified abnormal findings of blood chemistry: Secondary | ICD-10-CM

## 2023-02-21 LAB — TSH: TSH: 3.242 u[IU]/mL (ref 0.350–4.500)

## 2023-02-21 LAB — T4, FREE: Free T4: 1.72 ng/dL — ABNORMAL HIGH (ref 0.61–1.12)

## 2023-02-21 NOTE — Progress Notes (Signed)
Pt in for follow up caregiver reports pt has to be prompted to eat and if he isnt he does not eat anything. Reports drinking 1 complete shake a day.

## 2023-02-21 NOTE — Progress Notes (Signed)
Hematology/Oncology Consult Note [phone] [phone]     REFERRING PROVIDER: Gracelyn Nurse, MD    CHIEF COMPLAINTS/PURPOSE OF CONSULTATION:  Unintentional weight loss.   ASSESSMENT & PLAN:   Weight loss Feb 2024 CT showed no suspicion of cancer. Physical examination did not reveal any organomegaly or lymphadenopathy He has chronic respiratory failure, and pulmonary cachexia may contribute to his weight loss.  He also does not eat well which may lead to weight loss. Refer to nutritionist.  No M protein on SPEP, negative flowcytometry, normal B12, normal Folate, negative hepatitis panel, negative. HIV LDH is slightly elevated, this could be explained by increased self-destruction caused by his valve disease I will hold off additional workup at this point recommend observation. Recommend patient to continue follow-up with nutritionist and monitor weight.  History of bladder cancer He has lost follow up with urology CT showed thicken bladder wall.  Refer to urology.  Elevated TSH Repeat TSH normalized.  Free T4 is slightly increased.   No orders of the defined types were placed in this encounter.  Follow up 6 months All questions were answered. The patient knows to call the clinic with any problems, questions or concerns.  Jack Patience, MD, PhD [facility] Hematology Oncology 02/21/2023    HISTORY OF PRESENTING ILLNESS:  Jack Harris 89 y.o. male presents to establish care for unintentional weight loss.   Patient was accompanied by care giver. He has noticed weight loss, per patient and care giver, he dropped from 135 pounds to 125 pounds in the past 2-3 months.  I reviewed recorded weight documented during his previous medical visits.  09/15/2022 132Ib 07/21/2022 135 Ib 07/14/2023 134 Ib  He does not eat much, care give says " he eats to live, if he is not hungry, he does not eat".  He has normal regular formed bowel movement, no blood in  stool.  He is a "casual cigar smoker", chronic hoarseness after a remote throat surgery.  + fatigue, unsteady gait. His pcp discontinued his statin 2 weeks ago.  Denies  fever, chills, fatigue, night sweats. Denies dysphagia, mouth sore.   + Chronic respiratory failure, OSA, on Oxygen PRN. - he sees. Dr. Karna Christmas.   09/22/22 CT chest abdomen pelvis w contrast showed 1. No convincing evidence of malignancy within the chest, abdomen or pelvis. 2. Mild prominence of the biliary tree with the common duct measuring 9 mm, minimally greater than expected for patient's age. Large periampullary duodenal diverticulum which narrows the distal common duct near the ampulla. Constellation of findings are most compatible with senescent change and back pressure from the large periampullary duodenal diverticulum. Recommend correlation with LFTs and consider further evaluation with MRCP if clinical signs/symptoms of biliary obstruction 3. Colonic diverticulosis without findings of acute diverticulitis. 4. Moderate volume of formed stool in the colon. Correlate for constipation. 5. Symmetric wall thickening of the urinary bladder, which may be secondary to chronic outflow impedance or cystitis. 6. Unchanged aneurysmal dilation of the ascending thoracic aorta measuring 4.4 cm, Recommend annual imaging followup by CTA or MRA.This recommendation follows 2010 ACCF/AHA/AATS/ACR/ASA/SCA/SCAI/SIR/STS/SVM Guidelines for the Diagnosis and Management of Patients with Thoracic Aortic Disease. Circulation. 2010; 121: X914-N829. Aortic aneurysm NOS (ICD10-I71.9) 7.  Aortic Atherosclerosis  INTERVAL HISTORY Jack Harris is a 89 y.o. male who has above history reviewed by me today presents for follow up visit for unintended weight loss. Patient has had blood work done since last visit. He has establish care with nutritionist.  He  takes nutrition supplements. Weight has been stable since last visit. MEDICAL HISTORY:  Past  Medical History:  Diagnosis Date   AICD (automatic cardioverter/defibrillator) present    MEDTRONIC   Aortic insufficiency    Asthma    childhood asthma   Atrial fibrillation (HCC)    Cancer (HCC)    bladder, 10 yrs ago   Cancer (HCC)    squamous cell carcinoma   Cardiomyopathy (HCC)    CHF (congestive heart failure) (HCC)    Hyperlipidemia    Hypothyroidism    Paralysis of vocal cords and larynx, unilateral    Shortness of breath dyspnea     SURGICAL HISTORY: Past Surgical History:  Procedure Laterality Date   BLADDER SURGERY     Surgeon:Dr. Lonna Cobb, Location [facility]    CARDIAC DEFIBRILLATOR PLACEMENT     [facility]   EYE SURGERY Bilateral    Cataract Extraction with IOL   IMPLANTABLE CARDIOVERTER DEFIBRILLATOR (ICD) GENERATOR CHANGE Left 04/23/2019   Procedure: ICD GENERATOR CHANGE;  Surgeon: Marcina Millard, MD;  Location: [facility] ORS;  Service: Cardiovascular;  Laterality: Left;   THYROPLASTY Left 03/02/2016   Procedure: THYROPLASTY;  Surgeon: Linus Salmons, MD;  Location: [facility] ORS;  Service: ENT;  Laterality: Left;    SOCIAL HISTORY: Social History   Socioeconomic History   Marital status: Married    Spouse name: Not on file   Number of children: Not on file   Years of education: Not on file   Highest education level: Not on file  Occupational History   Not on file  Tobacco Use   Smoking status: Never   Smokeless tobacco: Former    Types: Chew  Substance and Sexual Activity   Alcohol use: Not Currently    Comment: occ   Drug use: No   Sexual activity: Not on file  Other Topics Concern   Not on file  Social History Narrative   Not on file   Social Determinants of Health   Financial Resource Strain: Low Risk  (01/26/2023)   Overall Financial Resource Strain (CARDIA)    Difficulty of Paying Living Expenses: Not very hard  Food Insecurity: No Food Insecurity (07/06/2022)   Hunger Vital Sign    Worried About Running Out of Food in the Last  Year: Never true    Ran Out of Food in the Last Year: Never true  Transportation Needs: No Transportation Needs (07/06/2022)   PRAPARE - Administrator, Civil Service (Medical): No    Lack of Transportation (Non-Medical): No  Physical Activity: Not on file  Stress: No Stress Concern Present (01/26/2023)   [employer] of Occupational Health - Occupational Stress Questionnaire    Feeling of Stress : Only a little  Recent Concern: Stress - Stress Concern Present (01/26/2023)   [employer] of Occupational Health - Occupational Stress Questionnaire    Feeling of Stress : To some extent  Social Connections: Not on file  Intimate Partner Violence: Not At Risk (07/06/2022)   Humiliation, Afraid, Rape, and Kick questionnaire    Fear of Current or Ex-Partner: No    Emotionally Abused: No    Physically Abused: No    Sexually Abused: No    FAMILY HISTORY: Family History  Problem Relation Age of Onset   Diabetes Maternal Grandmother     ALLERGIES:  is allergic to ciprofloxacin and levaquin [levofloxacin in d5w].  MEDICATIONS:  Current Outpatient Medications  Medication Sig Dispense Refill   amiodarone (PACERONE) 200 MG tablet Take 200  mg by mouth every evening.      atorvastatin (LIPITOR) 10 MG tablet Take 10 mg by mouth daily at 6 PM.     levothyroxine (SYNTHROID) 100 MCG tablet Take 100 mcg by mouth daily before breakfast.     metoprolol succinate (TOPROL-XL) 50 MG 24 hr tablet Take 12.5 mg by mouth every evening. Take with or immediately following a meal.     midodrine (PROAMATINE) 10 MG tablet Take 10 mg by mouth 3 (three) times daily.     vitamin B-12 (CYANOCOBALAMIN) 1000 MCG tablet Take 1,000 mcg by mouth daily.     warfarin (COUMADIN) 2.5 MG tablet Take 2.5 mg by mouth every evening.      escitalopram (LEXAPRO) 5 MG tablet Take 5 mg by mouth daily. (Patient not taking: Reported on 02/21/2023)     No current facility-administered medications for this visit.     Review of Systems  Constitutional:  Positive for fatigue and unexpected weight change. Negative for appetite change, chills and fever.  HENT:   Negative for hearing loss and voice change.   Eyes:  Negative for eye problems and icterus.  Respiratory:  Positive for shortness of breath. Negative for chest tightness and cough.   Cardiovascular:  Negative for chest pain and leg swelling.  Gastrointestinal:  Negative for abdominal distention and abdominal pain.  Endocrine: Negative for hot flashes.  Genitourinary:  Negative for difficulty urinating, dysuria and frequency.   Musculoskeletal:  Positive for gait problem. Negative for arthralgias.  Skin:  Negative for itching and rash.  Neurological:  Positive for gait problem. Negative for light-headedness and numbness.  Hematological:  Negative for adenopathy. Does not bruise/bleed easily.  Psychiatric/Behavioral:  Negative for confusion.      PHYSICAL EXAMINATION: ECOG PERFORMANCE STATUS: 1 - Symptomatic but completely ambulatory  Vitals:   02/21/23 1112  BP: 98/82  Pulse: 66  Resp: 16  Temp: (!) 96.9 F (36.1 C)  SpO2: 100%   Filed Weights   02/21/23 1112  Weight: 125 lb 8 oz (56.9 kg)    Physical Exam Constitutional:      General: He is not in acute distress.    Appearance: He is not diaphoretic.     Comments: Thin built  HENT:     Head: Normocephalic and atraumatic.     Nose: Nose normal.     Mouth/Throat:     Pharynx: No oropharyngeal exudate.  Eyes:     General: No scleral icterus.    Pupils: Pupils are equal, round, and reactive to light.  Cardiovascular:     Rate and Rhythm: Normal rate and regular rhythm.     Heart sounds: Murmur heard.  Pulmonary:     Effort: Pulmonary effort is normal. No respiratory distress.     Breath sounds: No rales.  Chest:     Chest wall: No tenderness.  Abdominal:     General: There is no distension.     Palpations: Abdomen is soft.     Tenderness: There is no abdominal  tenderness.  Musculoskeletal:        General: Normal range of motion.     Cervical back: Normal range of motion and neck supple.  Lymphadenopathy:     Cervical: No cervical adenopathy.  Skin:    General: Skin is warm and dry.     Findings: No erythema.  Neurological:     Mental Status: He is alert and oriented to person, place, and time.     Cranial Nerves: No cranial  nerve deficit.     Motor: No abnormal muscle tone.     Coordination: Coordination normal.  Psychiatric:        Mood and Affect: Mood and affect normal.      LABORATORY DATA:  I have reviewed the data as listed    Latest Ref Rng & Units 01/26/2023   12:06 PM 07/05/2022    9:56 PM 07/05/2022    3:14 PM  CBC  WBC 4.0 - 10.5 K/uL 3.5  4.2  5.2   Hemoglobin 13.0 - 17.0 g/dL 45.4  09.8  11.9   Hematocrit 39.0 - 52.0 % 43.8  38.4  39.3   Platelets 150 - 400 K/uL 260  359  356       Latest Ref Rng & Units 01/26/2023   12:06 PM 07/05/2022    9:56 PM 07/05/2022    3:14 PM  CMP  Glucose 70 - 99 mg/dL 80   147   BUN 8 - 23 mg/dL 27   20   Creatinine 8.29 - 1.24 mg/dL 5.62  1.30  8.65   Sodium 135 - 145 mmol/L 135   136   Potassium 3.5 - 5.1 mmol/L 4.4   4.4   Chloride 98 - 111 mmol/L 99   103   CO2 22 - 32 mmol/L 27   24   Calcium 8.9 - 10.3 mg/dL 9.5   8.8   Total Protein 6.5 - 8.1 g/dL 8.3     Total Bilirubin 0.3 - 1.2 mg/dL 0.9     Alkaline Phos 38 - 126 U/L 65     AST 15 - 41 U/L 31     ALT 0 - 44 U/L 20        RADIOGRAPHIC STUDIES: I have personally reviewed the radiological images as listed and agreed with the findings in the report. No results found.

## 2023-02-21 NOTE — Assessment & Plan Note (Addendum)
He has lost follow up with urology CT showed thicken bladder wall.  Refer to urology.  

## 2023-02-21 NOTE — Assessment & Plan Note (Addendum)
Feb 2024 CT showed no suspicion of cancer. Physical examination did not reveal any organomegaly or lymphadenopathy He has chronic respiratory failure, and pulmonary cachexia may contribute to his weight loss.  He also does not eat well which may lead to weight loss. Refer to nutritionist.  No M protein on SPEP, negative flowcytometry, normal B12, normal Folate, negative hepatitis panel, negative. HIV LDH is slightly elevated, this could be explained by increased self-destruction caused by his valve disease I will hold off additional workup at this point recommend observation. Recommend patient to continue follow-up with nutritionist and monitor weight.

## 2023-02-21 NOTE — Assessment & Plan Note (Signed)
Repeat TSH normalized.  Free T4 is slightly increased.

## 2023-02-22 ENCOUNTER — Ambulatory Visit: Payer: Medicare Other | Admitting: Urology

## 2023-02-22 ENCOUNTER — Encounter: Payer: Self-pay | Admitting: Urology

## 2023-02-22 VITALS — BP 131/79 | HR 68 | Ht 72.0 in | Wt 125.0 lb

## 2023-02-22 DIAGNOSIS — Z8551 Personal history of malignant neoplasm of bladder: Secondary | ICD-10-CM | POA: Diagnosis not present

## 2023-02-22 LAB — URINALYSIS, COMPLETE
Bilirubin, UA: NEGATIVE
Glucose, UA: NEGATIVE
Ketones, UA: NEGATIVE
Leukocytes,UA: NEGATIVE
Nitrite, UA: NEGATIVE
Protein,UA: NEGATIVE
Specific Gravity, UA: 1.02 (ref 1.005–1.030)
Urobilinogen, Ur: 0.2 mg/dL (ref 0.2–1.0)
pH, UA: 6 (ref 5.0–7.5)

## 2023-02-22 LAB — MICROSCOPIC EXAMINATION

## 2023-02-22 NOTE — Progress Notes (Signed)
I, Jack Harris,acting as a scribe for Jack Altes, MD.,have documented all relevant documentation on the behalf of Jack Altes, MD,as directed by  Jack Altes, MD while in the presence of Jack Altes, MD.  02/22/2023 11:46 AM   Jack Harris 1934-04-20 [id number]  Referring provider: [name], MD [address] [place],  [place] [address]  Chief Complaint  Patient presents with   New Patient (Initial Visit)    HPI: Jack Harris is a 89 y.o. male referred for history of bladder cancer.  Patient recently evaluated by his PCP and oncology for 17 pound weight loss with no significant findings identified.  He has a history of bladder cancer, and I saw him sometime between 2007-2012 for history of bladder cancer. He apparently had no recurrences and negative surveillance cystoscopies.  Denies gross hematuria or flank/abdominal/pelvic pain. CT chest/abdomen/pelvis showed some bladder wall thickening, but no hydronephrosis, upper tract abnormalities, or bladder mass.   PMH: Past Medical History:  Diagnosis Date   AICD (automatic cardioverter/defibrillator) present    MEDTRONIC   Aortic insufficiency    Asthma    childhood asthma   Atrial fibrillation (HCC)    Cancer (HCC)    bladder, 10 yrs ago   Cancer (HCC)    squamous cell carcinoma   Cardiomyopathy (HCC)    CHF (congestive heart failure) (HCC)    Hyperlipidemia    Hypothyroidism    Paralysis of vocal cords and larynx, unilateral    Shortness of breath dyspnea     Surgical History: Past Surgical History:  Procedure Laterality Date   BLADDER SURGERY     Surgeon:Dr. Lonna Harris, Location [facility]    CARDIAC DEFIBRILLATOR PLACEMENT     [facility]   EYE SURGERY Bilateral    Cataract Extraction with IOL   IMPLANTABLE CARDIOVERTER DEFIBRILLATOR (ICD) GENERATOR CHANGE Left 04/23/2019   Procedure: ICD GENERATOR CHANGE;  Surgeon: Jack Millard, MD;  Location: [facility] ORS;  Service:  Cardiovascular;  Laterality: Left;   THYROPLASTY Left 03/02/2016   Procedure: THYROPLASTY;  Surgeon: Jack Salmons, MD;  Location: [facility] ORS;  Service: ENT;  Laterality: Left;    Home Medications:  Allergies as of 02/22/2023       Reactions   Ciprofloxacin Hives   Levaquin [levofloxacin In D5w] Hives        Medication List        Accurate as of February 22, 2023 11:46 AM. If you have any questions, ask your nurse or doctor.          amiodarone 200 MG tablet Commonly known as: PACERONE Take 200 mg by mouth every evening.   atorvastatin 10 MG tablet Commonly known as: LIPITOR Take 10 mg by mouth daily at 6 PM.   cyanocobalamin 1000 MCG tablet Commonly known as: VITAMIN B12 Take 1,000 mcg by mouth daily.   escitalopram 5 MG tablet Commonly known as: LEXAPRO Take 5 mg by mouth daily.   levothyroxine 100 MCG tablet Commonly known as: SYNTHROID Take 100 mcg by mouth daily before breakfast.   metoprolol succinate 50 MG 24 hr tablet Commonly known as: TOPROL-XL Take 12.5 mg by mouth every evening. Take with or immediately following a meal.   midodrine 10 MG tablet Commonly known as: PROAMATINE Take 10 mg by mouth 3 (three) times daily.   warfarin 2.5 MG tablet Commonly known as: COUMADIN Take 2.5 mg by mouth every evening.        Allergies:  Allergies  Allergen Reactions   Ciprofloxacin Hives   Levaquin [Levofloxacin In D5w] Hives    Family History: Family History  Problem Relation Age of Onset   Diabetes Maternal Grandmother     Social History:  reports that he has never smoked. He has quit using smokeless tobacco.  His smokeless tobacco use included chew. He reports that he does not currently use alcohol. He reports that he does not use drugs.   Physical Exam: BP 131/79   Pulse 68   Ht 6' (1.829 m)   Wt 125 lb (56.7 kg)   BMI 16.95 kg/m   Constitutional:  Alert and oriented, No acute distress. HEENT: [place] AT Respiratory: Normal respiratory  effort, no increased work of breathing. Psychiatric: Normal mood and affect.   Urinalysis Microscopy 3-10 RBC   Pertinent Imaging: CT chest/abdomen/pelvis images were personally reviewed and interpreted.   CT CHEST/ABDOMEN/ PELVIS  EXAM: CT CHEST, ABDOMEN, AND PELVIS WITH CONTRAST   TECHNIQUE: Multidetector CT imaging of the chest, abdomen and pelvis was performed following the standard protocol during bolus administration of intravenous contrast.   RADIATION DOSE REDUCTION: This exam was performed according to the departmental dose-optimization program which includes automated exposure control, adjustment of the mA and/or kV according to patient size and/or use of iterative reconstruction technique.   CONTRAST:  OMNIPAQUE IOHEXOL 300 MG/ML  SOLN   COMPARISON:  Multiple priors including CT June 25, 2022.   FINDINGS: CT CHEST FINDINGS   Cardiovascular: Left chest wall AICD/pacemaker with leads in the right atrium and right ventricle. Unchanged aneurysmal dilation of the ascending thoracic aorta measuring 4.4 cm in maximum axial dimension measured in the sagittal plane. Aortic atherosclerosis. Four-chamber cardiac enlargement. Coronary artery calcifications.   Mediastinum/Nodes: No suspicious thyroid nodule. No pathologically enlarged mediastinal, hilar or axillary lymph nodes. Small hiatal hernia.   Lungs/Pleura: Central airways are patent. Interval resolution of the right middle lobe peribronchovascular consolidation. Scattered atelectasis/scarring. Mild interstitial opacities in the bilateral lung bases. Biapical pleuroparenchymal scarring. Solid 4 mm left lower lobe pulmonary nodule on image 100/4 is stable dating back to December 03, 2015 compatible with a benign finding.   Musculoskeletal: T9 vertebral body hemangioma. No aggressive lytic or blastic lesion of bone. Multilevel degenerative change of the spine. Degenerative change of the bilateral  shoulders.   CT ABDOMEN PELVIS FINDINGS   Hepatobiliary: Calcified hepatic granuloma. Scattered millimetric hypodense hepatic lesions are technically too small to accurately characterize but statistically likely to reflect cysts. No suspicious hepatic lesion. Gallbladder is unremarkable. Mild prominence of the biliary tree with the common duct measuring 9 mm, minimally greater than expected for patient's age. Large periampullary duodenal diverticulum which narrows the distal common duct near the ampulla   Pancreas: No pancreatic ductal dilation or evidence of acute inflammation.   Spleen: No splenomegaly.   Adrenals/Urinary Tract: Bilateral adrenal glands appear normal. No hydronephrosis. Fluid density bilateral renal lesions are compatible with cysts and considered benign requiring no independent imaging follow-up. Additional hypodense renal lesions are technically too small to accurately characterize but statistically likely to reflect cysts nonobstructive punctate right lower pole renal calculus. Symmetric wall thickening of the urinary bladder.   Stomach/Bowel: No radiopaque enteric contrast material was administered. Small hiatal hernia. Stomach is minimally distended limiting evaluation. Periampullary duodenal diverticulum. No pathologic dilation of small or large bowel. Moderate volume of formed stool in the colon. Colonic diverticulosis without findings of acute diverticulitis.   Vascular/Lymphatic: Aortic atherosclerosis. Smooth IVC contours. No pathologically enlarged abdominal or pelvic lymph  nodes.   Reproductive: Dystrophic calcifications in an enlarged prostate gland.   Other: No significant abdominopelvic free fluid.   Musculoskeletal: Multilevel degenerative change of the spine. No aggressive lytic or blastic lesion of bone. Partial bony ankylosis of the bilateral SI joints. Mild degenerative change of the bilateral hips.   IMPRESSION: 1. No convincing  evidence of malignancy within the chest, abdomen or pelvis. 2. Mild prominence of the biliary tree with the common duct measuring 9 mm, minimally greater than expected for patient's age. Large periampullary duodenal diverticulum which narrows the distal common duct near the ampulla. Constellation of findings are most compatible with senescent change and back pressure from the large periampullary duodenal diverticulum. Recommend correlation with LFTs and consider further evaluation with MRCP if clinical signs/symptoms of biliary obstruction 3. Colonic diverticulosis without findings of acute diverticulitis. 4. Moderate volume of formed stool in the colon. Correlate for constipation. 5. Symmetric wall thickening of the urinary bladder, which may be secondary to chronic outflow impedance or cystitis. 6. Unchanged aneurysmal dilation of the ascending thoracic aorta measuring 4.4 cm, Recommend annual imaging followup by CTA or MRA. This recommendation follows 2010 ACCF/AHA/AATS/ACR/ASA/SCA/SCAI/SIR/STS/SVM Guidelines for the Diagnosis and Management of Patients with Thoracic Aortic Disease. Circulation. 2010; 121: Z610-R604. Aortic aneurysm NOS (ICD10-I71.9) 7.  Aortic Atherosclerosis (ICD10-I70.0).     Electronically Signed   By: Maudry Mayhew M.D.   On: 10/02/2022 10:46   Assessment & Plan:    1. History of urothelial carcinoma bladder UA today with microhematuria Schedule cystoscopy for lower tract evaluation.  I have reviewed the above documentation for accuracy and completeness, and I agree with the above.   Jack Altes, MD  [facility] [address], [address] [place], [place] [address] [phone]

## 2023-03-14 ENCOUNTER — Ambulatory Visit: Payer: Medicare Other | Admitting: Urology

## 2023-03-14 VITALS — BP 92/58 | HR 71 | Ht 72.0 in | Wt 125.0 lb

## 2023-03-14 DIAGNOSIS — Z8551 Personal history of malignant neoplasm of bladder: Secondary | ICD-10-CM

## 2023-03-14 DIAGNOSIS — N400 Enlarged prostate without lower urinary tract symptoms: Secondary | ICD-10-CM

## 2023-03-14 DIAGNOSIS — N3289 Other specified disorders of bladder: Secondary | ICD-10-CM | POA: Diagnosis not present

## 2023-03-14 LAB — URINALYSIS, COMPLETE
Bilirubin, UA: NEGATIVE
Glucose, UA: NEGATIVE
Ketones, UA: NEGATIVE
Leukocytes,UA: NEGATIVE
Nitrite, UA: NEGATIVE
Protein,UA: NEGATIVE
Specific Gravity, UA: 1.02 (ref 1.005–1.030)
Urobilinogen, Ur: 0.2 mg/dL (ref 0.2–1.0)
pH, UA: 6 (ref 5.0–7.5)

## 2023-03-14 LAB — MICROSCOPIC EXAMINATION

## 2023-03-14 NOTE — Progress Notes (Unsigned)
   03/14/23  CC:  Chief Complaint  Patient presents with   Cysto    HPI: Refer to my previous note 02/22/2023.  Blood pressure (!) 92/58, pulse 71, height 6' (1.829 m), weight 125 lb (56.7 kg). NED. A&Ox3.   No respiratory distress   Abd soft, NT, ND Normal phallus with bilateral descended testicles  Cystoscopy Procedure Note  Patient identification was confirmed, informed consent was obtained, and patient was prepped using Betadine solution.  Lidocaine jelly was administered per urethral meatus.     Pre-Procedure: - Inspection reveals a normal caliber ureteral meatus.  Procedure: The flexible cystoscope was introduced without difficulty - No urethral strictures/lesions are present. -  Mild-moderate lateral lobe enlargement  prostate  -  Small median lobe  bladder neck - Bilateral ureteral orifices identified - Bladder mucosa  reveals no ulcers, tumors, or lesions - No bladder stones -Trabeculated bladder with scattered cellules  Retroflexion shows small intravesical median lobe   Post-Procedure: - Patient tolerated the procedure well  Assessment/ Plan: No evidence recurrent bladder cancer Trabeculated bladder secondary to BPH Follow-up prn    Riki Altes, MD

## 2023-03-21 ENCOUNTER — Inpatient Hospital Stay: Payer: Medicare Other | Attending: Oncology

## 2023-03-21 NOTE — Progress Notes (Signed)
Nutrition Follow-up:   Patient with history of bladder cancer.  Currently on surveillance.    Met with patient in clinic today and caregiver Boyd Kerbs on speaker phone.  Boyd Kerbs reports that over the past 3 days patient has made an effort to snack in between meals (3 a day).  She is preparing high calorie foods.  Portion sizes are still small.  Has been drinking boost VHC (530 calories) 1 time a day . Also has some 360 calorie shakes as well.  Patient reports feelings of depression and had requested medication from MD.  He has stopped this but planning to discuss other alternatives with PCP at appointment on 8/9.  Also reports feeling unsteady/off balance at times but has not fallen.    Concerned about Hgba1c 6.4%. Planning PCP visit on 8/9.   Medications: reviewed  Labs: reviewed  Anthropometrics:   Weight 126 lb 2 oz today 125 lb on 7/31 127 lb on 6/26   NUTRITION DIAGNOSIS: Unintentional weight loss stable    INTERVENTION:  Consider trial of appetite stimulant.  Patient will discussed with PCP Continue boost VHC 530 calories 1-2 times per day Liberalize diet.  Would encourage controlling blood glucose with medication vs restricting diet at this time due to severe malnutrition Discussed option of OT evaluation at the cancer center for balance/weakness. Prefers to hold off at this time Planning to address another option for antidepressant with PCP at visit on 8/8 Continue high calorie, high protein foods Patient has contact number and prefers to call RD if needed in the future    MONITORING, EVALUATION, GOAL: weight trends, intake   NEXT VISIT: no follow-up RD available if needed  Shelvia Fojtik B. Freida Busman, RD, LDN Registered Dietitian [phone]

## 2023-07-29 ENCOUNTER — Emergency Department: Payer: Medicare Other

## 2023-07-29 ENCOUNTER — Other Ambulatory Visit: Payer: Self-pay

## 2023-07-29 ENCOUNTER — Inpatient Hospital Stay
Admission: EM | Admit: 2023-07-29 | Discharge: 2023-08-01 | DRG: 872 | Disposition: A | Discharge status: Home Health | Payer: Medicare Other | Attending: Internal Medicine | Admitting: Internal Medicine

## 2023-07-29 DIAGNOSIS — E440 Moderate protein-calorie malnutrition: Secondary | ICD-10-CM | POA: Diagnosis present

## 2023-07-29 DIAGNOSIS — I480 Paroxysmal atrial fibrillation: Secondary | ICD-10-CM | POA: Diagnosis present

## 2023-07-29 DIAGNOSIS — I272 Pulmonary hypertension, unspecified: Secondary | ICD-10-CM | POA: Diagnosis present

## 2023-07-29 DIAGNOSIS — F32A Depression, unspecified: Secondary | ICD-10-CM | POA: Diagnosis present

## 2023-07-29 DIAGNOSIS — F419 Anxiety disorder, unspecified: Secondary | ICD-10-CM | POA: Diagnosis present

## 2023-07-29 DIAGNOSIS — I482 Chronic atrial fibrillation, unspecified: Secondary | ICD-10-CM | POA: Diagnosis present

## 2023-07-29 DIAGNOSIS — Z87891 Personal history of nicotine dependence: Secondary | ICD-10-CM

## 2023-07-29 DIAGNOSIS — Z79890 Hormone replacement therapy (postmenopausal): Secondary | ICD-10-CM | POA: Diagnosis not present

## 2023-07-29 DIAGNOSIS — I2510 Atherosclerotic heart disease of native coronary artery without angina pectoris: Secondary | ICD-10-CM | POA: Diagnosis present

## 2023-07-29 DIAGNOSIS — N400 Enlarged prostate without lower urinary tract symptoms: Secondary | ICD-10-CM | POA: Diagnosis present

## 2023-07-29 DIAGNOSIS — R531 Weakness: Secondary | ICD-10-CM | POA: Diagnosis present

## 2023-07-29 DIAGNOSIS — I951 Orthostatic hypotension: Secondary | ICD-10-CM | POA: Diagnosis present

## 2023-07-29 DIAGNOSIS — I429 Cardiomyopathy, unspecified: Secondary | ICD-10-CM | POA: Diagnosis present

## 2023-07-29 DIAGNOSIS — I110 Hypertensive heart disease with heart failure: Secondary | ICD-10-CM | POA: Diagnosis present

## 2023-07-29 DIAGNOSIS — A419 Sepsis, unspecified organism: Secondary | ICD-10-CM | POA: Diagnosis not present

## 2023-07-29 DIAGNOSIS — Z79899 Other long term (current) drug therapy: Secondary | ICD-10-CM | POA: Diagnosis not present

## 2023-07-29 DIAGNOSIS — E872 Acidosis, unspecified: Secondary | ICD-10-CM | POA: Diagnosis present

## 2023-07-29 DIAGNOSIS — Z833 Family history of diabetes mellitus: Secondary | ICD-10-CM

## 2023-07-29 DIAGNOSIS — I351 Nonrheumatic aortic (valve) insufficiency: Secondary | ICD-10-CM | POA: Diagnosis present

## 2023-07-29 DIAGNOSIS — Z7901 Long term (current) use of anticoagulants: Secondary | ICD-10-CM

## 2023-07-29 DIAGNOSIS — E785 Hyperlipidemia, unspecified: Secondary | ICD-10-CM | POA: Diagnosis present

## 2023-07-29 DIAGNOSIS — I5022 Chronic systolic (congestive) heart failure: Secondary | ICD-10-CM | POA: Diagnosis present

## 2023-07-29 DIAGNOSIS — Z681 Body mass index (BMI) 19 or less, adult: Secondary | ICD-10-CM

## 2023-07-29 DIAGNOSIS — N390 Urinary tract infection, site not specified: Secondary | ICD-10-CM | POA: Diagnosis present

## 2023-07-29 DIAGNOSIS — I4891 Unspecified atrial fibrillation: Secondary | ICD-10-CM | POA: Diagnosis present

## 2023-07-29 DIAGNOSIS — E039 Hypothyroidism, unspecified: Secondary | ICD-10-CM | POA: Diagnosis present

## 2023-07-29 DIAGNOSIS — Z95810 Presence of automatic (implantable) cardiac defibrillator: Secondary | ICD-10-CM | POA: Diagnosis not present

## 2023-07-29 LAB — CBC WITH DIFFERENTIAL/PLATELET
Abs Immature Granulocytes: 0.04 10*3/uL (ref 0.00–0.07)
Basophils Absolute: 0 10*3/uL (ref 0.0–0.1)
Basophils Relative: 0 %
Eosinophils Absolute: 0 10*3/uL (ref 0.0–0.5)
Eosinophils Relative: 0 %
HCT: 39.1 % (ref 39.0–52.0)
Hemoglobin: 13 g/dL (ref 13.0–17.0)
Immature Granulocytes: 1 %
Lymphocytes Relative: 4 %
Lymphs Abs: 0.3 10*3/uL — ABNORMAL LOW (ref 0.7–4.0)
MCH: 31 pg (ref 26.0–34.0)
MCHC: 33.2 g/dL (ref 30.0–36.0)
MCV: 93.3 fL (ref 80.0–100.0)
Monocytes Absolute: 0.8 10*3/uL (ref 0.1–1.0)
Monocytes Relative: 10 %
Neutro Abs: 6.7 10*3/uL (ref 1.7–7.7)
Neutrophils Relative %: 85 %
Platelets: 184 10*3/uL (ref 150–400)
RBC: 4.19 MIL/uL — ABNORMAL LOW (ref 4.22–5.81)
RDW: 13.6 % (ref 11.5–15.5)
WBC: 7.8 10*3/uL (ref 4.0–10.5)
nRBC: 0 % (ref 0.0–0.2)

## 2023-07-29 LAB — COMPREHENSIVE METABOLIC PANEL
ALT: 20 U/L (ref 0–44)
AST: 26 U/L (ref 15–41)
Albumin: 3.8 g/dL (ref 3.5–5.0)
Alkaline Phosphatase: 59 U/L (ref 38–126)
Anion gap: 9 (ref 5–15)
BUN: 26 mg/dL — ABNORMAL HIGH (ref 8–23)
CO2: 25 mmol/L (ref 22–32)
Calcium: 8.8 mg/dL — ABNORMAL LOW (ref 8.9–10.3)
Chloride: 103 mmol/L (ref 98–111)
Creatinine, Ser: 1.2 mg/dL (ref 0.61–1.24)
GFR, Estimated: 59 mL/min — ABNORMAL LOW (ref >60)
Glucose, Bld: 147 mg/dL — ABNORMAL HIGH (ref 70–99)
Potassium: 4.4 mmol/L (ref 3.5–5.1)
Sodium: 137 mmol/L (ref 135–145)
Total Bilirubin: 0.5 mg/dL (ref <1.2)
Total Protein: 6.6 g/dL (ref 6.5–8.1)

## 2023-07-29 LAB — URINALYSIS, W/ REFLEX TO CULTURE (INFECTION SUSPECTED)
Bacteria, UA: NONE SEEN
Bilirubin Urine: NEGATIVE
Glucose, UA: NEGATIVE mg/dL
Ketones, ur: NEGATIVE mg/dL
Nitrite: NEGATIVE
Protein, ur: NEGATIVE mg/dL
Specific Gravity, Urine: 1.019 (ref 1.005–1.030)
pH: 5 (ref 5.0–8.0)

## 2023-07-29 LAB — LACTIC ACID, PLASMA
Lactic Acid, Venous: 2 mmol/L (ref 0.5–1.9)
Lactic Acid, Venous: 1.2 mmol/L (ref 0.5–1.9)

## 2023-07-29 LAB — PROTIME-INR
INR: 2.6 — ABNORMAL HIGH (ref 0.8–1.2)
Prothrombin Time: 27.8 s — ABNORMAL HIGH (ref 11.4–15.2)

## 2023-07-29 LAB — SARS CORONAVIRUS 2 BY RT PCR: SARS Coronavirus 2 by RT PCR: NEGATIVE

## 2023-07-29 MED ORDER — ONDANSETRON HCL 4 MG PO TABS: 4.0000 mg | ORAL_TABLET | Freq: Four times a day (QID) | ORAL | Status: DC | PRN

## 2023-07-29 MED ORDER — METOPROLOL SUCCINATE ER 25 MG PO TB24
12.5000 mg | ORAL_TABLET | Freq: Every evening | ORAL | Status: DC
  Administered 2023-07-30 – 2023-07-31 (×2): 12.5 mg via ORAL
  Filled 2023-07-29 (×2): qty 1

## 2023-07-29 MED ORDER — SODIUM CHLORIDE 0.9 % IV SOLN
1.0000 g | INTRAVENOUS | Status: AC
  Administered 2023-07-29: 1 g via INTRAVENOUS
  Filled 2023-07-29: qty 10

## 2023-07-29 MED ORDER — MIDODRINE HCL 5 MG PO TABS
10.0000 mg | ORAL_TABLET | Freq: Once | ORAL | Status: AC
  Administered 2023-07-29: 10 mg via ORAL
  Filled 2023-07-29: qty 2

## 2023-07-29 MED ORDER — ATORVASTATIN CALCIUM 10 MG PO TABS
10.0000 mg | ORAL_TABLET | Freq: Every day | ORAL | Status: DC
  Administered 2023-07-29 – 2023-07-31 (×3): 10 mg via ORAL
  Filled 2023-07-29 (×3): qty 1

## 2023-07-29 MED ORDER — ENSURE ENLIVE PO LIQD
237.0000 mL | Freq: Two times a day (BID) | ORAL | Status: DC
  Administered 2023-07-29 – 2023-08-01 (×2): 237 mL via ORAL

## 2023-07-29 MED ORDER — TAMSULOSIN HCL 0.4 MG PO CAPS
0.4000 mg | ORAL_CAPSULE | Freq: Every day | ORAL | Status: DC
  Administered 2023-07-29 – 2023-07-31 (×3): 0.4 mg via ORAL
  Filled 2023-07-29 (×3): qty 1

## 2023-07-29 MED ORDER — ONDANSETRON HCL 4 MG/2ML IJ SOLN
4.0000 mg | Freq: Four times a day (QID) | INTRAMUSCULAR | Status: DC | PRN
Start: 2023-07-29 — End: 2023-08-01

## 2023-07-29 MED ORDER — WARFARIN 1.25 MG HALF TABLET
1.2500 mg | ORAL_TABLET | Freq: Once | ORAL | Status: AC
  Administered 2023-07-29: 1.25 mg via ORAL
  Filled 2023-07-29: qty 1

## 2023-07-29 MED ORDER — WARFARIN - PHARMACIST DOSING INPATIENT
Freq: Every day | Status: DC
  Filled 2023-07-29: qty 1

## 2023-07-29 MED ORDER — SODIUM CHLORIDE 0.9 % IV SOLN
1.0000 g | INTRAVENOUS | Status: DC
  Administered 2023-07-30 – 2023-08-01 (×3): 1 g via INTRAVENOUS
  Filled 2023-07-29 (×3): qty 10

## 2023-07-29 MED ORDER — LEVOTHYROXINE SODIUM 50 MCG PO TABS
100.0000 ug | ORAL_TABLET | Freq: Every day | ORAL | Status: DC
  Administered 2023-07-30 – 2023-08-01 (×3): 100 ug via ORAL
  Filled 2023-07-29 (×3): qty 2

## 2023-07-29 MED ORDER — LACTATED RINGERS IV BOLUS
500.0000 mL | Freq: Once | INTRAVENOUS | Status: AC
  Administered 2023-07-29: 500 mL via INTRAVENOUS

## 2023-07-29 MED ORDER — ACETAMINOPHEN 325 MG PO TABS
650.0000 mg | ORAL_TABLET | Freq: Once | ORAL | Status: AC
  Administered 2023-07-29: 650 mg via ORAL
  Filled 2023-07-29: qty 2

## 2023-07-29 MED ORDER — AMIODARONE HCL 200 MG PO TABS
200.0000 mg | ORAL_TABLET | Freq: Every evening | ORAL | Status: DC
  Administered 2023-07-29 – 2023-07-31 (×3): 200 mg via ORAL
  Filled 2023-07-29 (×3): qty 1

## 2023-07-29 NOTE — ED Notes (Signed)
Pt denies further needs at this time.

## 2023-07-29 NOTE — Consult Note (Signed)
CODE SEPSIS - PHARMACY COMMUNICATION  **Broad Spectrum Antibiotics should be administered within 1 hour of Sepsis diagnosis**  Time Code Sepsis Called/Page Received: 1155  Antibiotics Ordered: Ceftriaxone   Time of 1st antibiotic administration: 1245  Additional action taken by pharmacy: Messaged RN  If necessary, Name of Provider/Nurse Contacted: N/A  [name], PharmD Pharmacy Resident  07/29/2023 12:07 PM

## 2023-07-29 NOTE — ED Notes (Signed)
Pt resting comfortably at this time.

## 2023-07-29 NOTE — ED Provider Notes (Signed)
[facility] Provider Note    Event Date/Time   First MD Initiated Contact with Patient 07/29/23 1110     (approximate)   History   Weakness   HPI  Jack Harris is a 89 y.o. male history of A-fib, cardiomyopathy community-acquired pneumonia   He was here today as he had 2 episodes of feeling very faint, 1 in which he sort of stumbled and struck his head.  He is also been experiencing fevers chills and fatigue  He has a hoarse voice but reports that is chronic due to a previous vocal cord injury  No cough no shortness of breath no headache.  No chest pain.  Got up this morning feeling fatigued, a little bit generally weak, and then had to lower himself to the ground.  Family assisted him back up and then he got very weak again they had to sort of quickly assisted him to the ground, but in so doing he did strike his head on a piece of furniture.  He denies any injury but does report he did takes Coumadin  He has noticed a little bit of urine burning from time to time  Vomited once this morning.  Denies abdominal pain no further nausea  Physical Exam   Triage Vital Signs: ED Triage Vitals  Encounter Vitals Group     BP 07/29/23 0740 125/69     Systolic BP Percentile --      Diastolic BP Percentile --      Pulse Rate 07/29/23 0740 (!) 101     Resp 07/29/23 0740 16     Temp 07/29/23 0741 (!) 101.8 F (38.8 C)     Temp Source 07/29/23 0740 Oral     SpO2 07/29/23 0740 94 %     Weight 07/29/23 0740 126 lb 1.7 oz (57.2 kg)     Height --      Head Circumference --      Peak Flow --      Pain Score 07/29/23 0740 0     Pain Loc --      Pain Education --      Exclude from Growth Chart --     Most recent vital signs: Vitals:   07/29/23 0741 07/29/23 0952  BP:    Pulse:    Resp:    Temp: (!) 101.8 F (38.8 C) 98.5 F (36.9 C)  SpO2:       General: Awake, no distress.  Normocephalic atraumatic.  No cervical tenderness CV:  Good peripheral  perfusion.  Normal tones and rate / Tachycardia Resp:  Normal effort.  Clear bilateral Abd:  No distention.  Soft nontender nondistended throughout Other:  Rashes.  No tenderness to percussion of costovertebral angles   ED Results / Procedures / Treatments   Labs (all labs ordered are listed, but only abnormal results are displayed) Labs Reviewed  LACTIC ACID, PLASMA - Abnormal; Notable for the following components:      Result Value   Lactic Acid, Venous 2.0 (*)    All other components within normal limits  COMPREHENSIVE METABOLIC PANEL - Abnormal; Notable for the following components:   Glucose, Bld 147 (*)    BUN 26 (*)    Calcium 8.8 (*)    GFR, Estimated 59 (*)    All other components within normal limits  CBC WITH DIFFERENTIAL/PLATELET - Abnormal; Notable for the following components:   RBC 4.19 (*)    Lymphs Abs 0.3 (*)  All other components within normal limits  URINALYSIS, W/ REFLEX TO CULTURE (INFECTION SUSPECTED) - Abnormal; Notable for the following components:   Color, Urine YELLOW (*)    APPearance HAZY (*)    Hgb urine dipstick SMALL (*)    Leukocytes,Ua SMALL (*)    All other components within normal limits  SARS CORONAVIRUS 2 BY RT PCR  URINE CULTURE  CULTURE, BLOOD (ROUTINE X 2)  CULTURE, BLOOD (ROUTINE X 2)  LACTIC ACID, PLASMA   Labs notable for slightly elevated lactic acid.  EKG  And interpreted by me at 845 heart rate 100 QRS 150 QTc 400 Normal sinus rhythm, left bundle branch block/first-degree AV block   RADIOLOGY  X-ray interpreted as clear by me.  ICD/pacemaker   CT Head Wo Contrast Result Date: 07/29/2023 CLINICAL DATA:  Facial trauma, blunt. Patient fell getting out of bed. No loss of consciousness. Patient did not strike his head. The patient takes warfarin. EXAM: CT HEAD WITHOUT CONTRAST TECHNIQUE: Contiguous axial images were obtained from the base of the skull through the vertex without intravenous contrast. RADIATION DOSE  REDUCTION: This exam was performed according to the departmental dose-optimization program which includes automated exposure control, adjustment of the mA and/or kV according to patient size and/or use of iterative reconstruction technique. COMPARISON:  CT head without contrast 12/03/2015 FINDINGS: Brain: No acute infarct, hemorrhage, or mass lesion is present. Mild atrophy and white matter changes are stable, likely within normal limits for age. The ventricles are proportionate to the degree of atrophy. Deep brain nuclei are within normal limits. No significant extraaxial fluid collection is present. The brainstem and cerebellum are within normal limits. Midline structures are within normal limits. Vascular: Atherosclerotic calcifications are present within the cavernous internal carotid arteries and at the dural margin of both vertebral arteries. No hyperdense vessels are present. Skull: Calvarium is intact. No focal lytic or blastic lesions are present. No significant extracranial soft tissue lesion is present. Sinuses/Orbits: The paranasal sinuses and mastoid air cells are clear. Bilateral lens replacements are noted. Globes and orbits are otherwise unremarkable. IMPRESSION: 1. No acute intracranial abnormality or significant interval change. 2. Stable atrophy and white matter disease, likely within normal limits for age. Electronically Signed   By: Marin Roberts M.D.   On: 07/29/2023 12:26   DG Chest 2 View Result Date: 07/29/2023 CLINICAL DATA:  89 year old male with history of fever. EXAM: CHEST - 2 VIEW COMPARISON:  Chest x-ray 07/05/2022. FINDINGS: Lung volumes are normal. No consolidative airspace disease. No definite pleural effusions. No pneumothorax. No evidence of pulmonary edema. Heart size is mildly enlarged. Upper mediastinal contours are within normal limits. Atherosclerotic calcifications in the thoracic aorta. Left-sided pacemaker/AICD device in place with lead tips projecting over the  expected location of the right atrium and right ventricle. IMPRESSION: 1. No radiographic evidence of acute cardiopulmonary disease. 2. Cardiomegaly. 3. Aortic atherosclerosis. Electronically Signed   By: Trudie Reed M.D.   On: 07/29/2023 08:16     PROCEDURES:  Critical Care performed: No  Procedures   MEDICATIONS ORDERED IN ED: Medications  cefTRIAXone (ROCEPHIN) 1 g in sodium chloride 0.9 % 100 mL IVPB (1 g Intravenous New Bag/Given 07/29/23 1245)  acetaminophen (TYLENOL) tablet 650 mg (650 mg Oral Given 07/29/23 0746)  lactated ringers bolus 500 mL (500 mLs Intravenous New Bag/Given 07/29/23 1245)     IMPRESSION / MDM / ASSESSMENT AND PLAN / ED COURSE  I reviewed the triage vital signs and the nursing notes.  Differential diagnosis includes, but is not limited to, suspect infection with sepsis, very mild tachycardia, along with this he has a fever, reports some dysuria weakness fatigue.  He did not fall with a hard strike or injury but did strike his head while being assisted or somewhat falling with the assistance of family at the bedside.  Denies any obvious injury, CT of the head performed to evaluate given patient on Coumadin  I am concerned he has infection he is febrile his urinalysis demonstrates lots of white cells and leukocytes but no bacteria are seen but is a clean sample.  Suspect urinary tract infection.  Sepsis is present by criteria, code sepsis initiated.  He does not have evidence of severe sepsis or septic shock  Denies any acute cardiac or pulmonary symptoms.  He is awake alert well-oriented.  Will give small fluid bolus, initiate Rocephin, urine sent for culture.  Patient's presentation is most consistent with acute complicated illness / injury requiring diagnostic workup.     Patient accepted in admission by Dr. Chipper Herb     FINAL CLINICAL IMPRESSION(S) / ED DIAGNOSES   Final diagnoses:  Generalized weakness  Urinary  tract infection, acute  Sepsis, due to unspecified organism, unspecified whether acute organ dysfunction present [facility])     Rx / DC Orders   ED Discharge Orders     None        Note:  This document was prepared using Dragon voice recognition software and may include unintentional dictation errors.   Sharyn Creamer, MD 07/29/23 1302

## 2023-07-29 NOTE — ED Notes (Signed)
Lab called to add on PT-INR 

## 2023-07-29 NOTE — ED Notes (Signed)
Called pharmacy for med rec  

## 2023-07-29 NOTE — Sepsis Progress Note (Signed)
Elink following code sepsis °

## 2023-07-29 NOTE — ED Notes (Signed)
Messaged covering provider regarding BP. Pt reports that he typically takes midodrine three times a day.

## 2023-07-29 NOTE — Consult Note (Signed)
PHARMACY - ANTICOAGULATION CONSULT NOTE  Pharmacy Consult for Warfarin Indication: atrial fibrillation  Allergies  Allergen Reactions   Ciprofloxacin Hives   Levaquin [Levofloxacin In D5w] Hives   Patient Measurements: Weight: 57.2 kg (126 lb 1.7 oz)  Vital Signs: Temp: 98.5 F (36.9 C) (12/15 0952) Temp Source: Oral (12/15 0952) BP: 97/67 (12/15 1330) Pulse Rate: 105 (12/15 1330)  Labs: Recent Labs    07/29/23 0744  HGB 13.0  HCT 39.1  PLT 184  CREATININE 1.20   Estimated Creatinine Clearance: 35.1 mL/min (by C-G formula based on SCr of 1.2 mg/dL).  Medical History: Past Medical History:  Diagnosis Date   AICD (automatic cardioverter/defibrillator) present    MEDTRONIC   Aortic insufficiency    Asthma    childhood asthma   Atrial fibrillation (HCC)    Cancer (HCC)    bladder, 10 yrs ago   Cancer (HCC)    squamous cell carcinoma   Cardiomyopathy (HCC)    CHF (congestive heart failure) (HCC)    Hyperlipidemia    Hypothyroidism    Paralysis of vocal cords and larynx, unilateral    Shortness of breath dyspnea    Medications:  Home regimen was confirmed with patient as alternating between 1.25 mg and 2.5 mg daily - last dose was 2.5 mg taken 12/14 PM   DDI:  PTA meds - amiodarone   Assessment: Jack Harris is an 89 year old male that presented with generalized weakness. Reports that he work up this morning and fell getting out of bed. PMH is significant for atrial fibrillation for which he takes warfarin. Patient took his last dose on 12/14 PM. Pharmacy has been consulted for management of warfarin.   Goal of Therapy:  INR 2-3 Monitor platelets by anticoagulation protocol: Yes   Plan:  INR therapeutic at 2.6  Continue home regimen of 1.25 mg x 1 tonight  Monitor INR daily with AM labs   [name], PharmD Pharmacy Resident  07/29/2023 1:42 PM

## 2023-07-29 NOTE — ED Notes (Signed)
Stood patient to void. Assist x 2. Tolerated well.

## 2023-07-29 NOTE — ED Triage Notes (Addendum)
Pt in via EMS from home with c/o generalized weakness when he woke up this am and fell getting out of bed. No LOC, he did hit his head and takes warfarin. Pt also has UTI symptoms. 112/86, HR 100, 97% RA, CBG 118, temp 101.7, #18g to the left FA

## 2023-07-29 NOTE — H&P (Signed)
History and Physical    BETHEL DEERY BMW:[id number] DOB: [birth year] DOA: 07/29/2023  PCP: Gracelyn Nurse, MD (Confirm with patient/family/NH records and if not entered, this has to be entered at [facility] point of entry) Patient coming from: Home  I have personally briefly reviewed patient's old medical records in [facility]  Chief Complaint: Feeling weak, fever, painful urination, fever  HPI: Jack Harris is a 89 y.o. male with medical history significant of PAF on Eliquis, chronic HFrEF with LVEF 40%, cardiomyopathy status post AICD, moderate pulmonary hypertension, hypothyroidism, vocal cord paralysis with chronic hoarseness, brought in by family member for duration of generalized weakness, dysuria and fall at home.  Symptoms started this morning, patient started to feel generalized weakness, feeling nausea and vomited x 1 with stomach content, and started to feel dizziness, when trying to get out of bed, he fell for the first time.  No LOC no head injury.  Family was able to hold him up then he fell again and hit his head on the back, no LOC.  No postictal confusion.  Per member checked his temperature was 101 and gave him 1 dose of Tylenol.  Patient denies any cough, no abdominal pain or chest pain, no diarrhea.  ED Course: Temperature 1-1.8, borderline tachycardia, blood pressure 120/60.  Not hypoxic.  UA showed pyuria negative nitrite.  WBC 7.8, patient was given ceftriaxone in the ED.  CT head negative for acute findings but brain atrophy.  Review of Systems: As per HPI otherwise 14 point review of systems negative.    Past Medical History:  Diagnosis Date   AICD (automatic cardioverter/defibrillator) present    [employer]   Aortic insufficiency    Asthma    childhood asthma   Atrial fibrillation (HCC)    Cancer (HCC)    bladder, 10 yrs ago   Cancer (HCC)    squamous cell carcinoma   Cardiomyopathy (HCC)    CHF (congestive heart failure) (HCC)    Hyperlipidemia     Hypothyroidism    Paralysis of vocal cords and larynx, unilateral    Shortness of breath dyspnea     Past Surgical History:  Procedure Laterality Date   BLADDER SURGERY     Surgeon:Dr. Lonna Cobb, Location [facility]    CARDIAC DEFIBRILLATOR PLACEMENT     [facility]   EYE SURGERY Bilateral    Cataract Extraction with IOL   IMPLANTABLE CARDIOVERTER DEFIBRILLATOR (ICD) GENERATOR CHANGE Left 04/23/2019   Procedure: ICD GENERATOR CHANGE;  Surgeon: Marcina Millard, MD;  Location: [facility] ORS;  Service: Cardiovascular;  Laterality: Left;   THYROPLASTY Left 03/02/2016   Procedure: THYROPLASTY;  Surgeon: Linus Salmons, MD;  Location: [facility] ORS;  Service: ENT;  Laterality: Left;     reports that he has never smoked. He has quit using smokeless tobacco.  His smokeless tobacco use included chew. He reports that he does not currently use alcohol. He reports that he does not use drugs.  Allergies  Allergen Reactions   Ciprofloxacin Hives   Levaquin [Levofloxacin In D5w] Hives    Family History  Problem Relation Age of Onset   Diabetes Maternal Grandmother      Prior to Admission medications   Medication Sig Start Date End Date Taking? Authorizing Provider  amiodarone (PACERONE) 200 MG tablet Take 200 mg by mouth every evening.     [provider]  atorvastatin (LIPITOR) 10 MG tablet Take 10 mg by mouth daily at 6 PM.    [provider]  levothyroxine (SYNTHROID) 100 MCG tablet Take 100 mcg by mouth daily before breakfast. 02/06/19   [provider]  metoprolol succinate (TOPROL-XL) 50 MG 24 hr tablet Take 12.5 mg by mouth every evening. Take with or immediately following a meal.    [provider]  vitamin B-12 (CYANOCOBALAMIN) 1000 MCG tablet Take 1,000 mcg by mouth daily.    [provider]  warfarin (COUMADIN) 2.5 MG tablet Take 2.5 mg by mouth every evening.     [provider]    Physical Exam: Vitals:   07/29/23 0740 07/29/23  0741 07/29/23 0952  BP: 125/69    Pulse: (!) 101    Resp: 16    Temp:  (!) 101.8 F (38.8 C) 98.5 F (36.9 C)  TempSrc: Oral  Oral  SpO2: 94%    Weight: 57.2 kg      Constitutional: NAD, calm, comfortable Vitals:   07/29/23 0740 07/29/23 0741 07/29/23 0952  BP: 125/69    Pulse: (!) 101    Resp: 16    Temp:  (!) 101.8 F (38.8 C) 98.5 F (36.9 C)  TempSrc: Oral  Oral  SpO2: 94%    Weight: 57.2 kg     Eyes: PERRL, lids and conjunctivae normal ENMT: Mucous membranes are moist. Posterior pharynx clear of any exudate or lesions.Normal dentition.  Neck: normal, supple, no masses, no thyromegaly Respiratory: clear to auscultation bilaterally, no wheezing, no crackles. Normal respiratory effort. No accessory muscle use.  Cardiovascular: Regular rate and rhythm, no murmurs / rubs / gallops. No extremity edema. 2+ pedal pulses. No carotid bruits.  Abdomen: no tenderness, no masses palpated. No hepatosplenomegaly. Bowel sounds positive.  Musculoskeletal: no clubbing / cyanosis. No joint deformity upper and lower extremities. Good ROM, no contractures. Normal muscle tone.  Skin: no rashes, lesions, ulcers. No induration Neurologic: CN 2-12 grossly intact. Sensation intact, DTR normal. Strength 5/5 in all 4.  Psychiatric: Normal judgment and insight. Alert and oriented x 3. Normal mood.     Labs on Admission: I have personally reviewed following labs and imaging studies  CBC: Recent Labs  Lab 07/29/23 0744  WBC 7.8  NEUTROABS 6.7  HGB 13.0  HCT 39.1  MCV 93.3  PLT 184   Basic Metabolic Panel: Recent Labs  Lab 07/29/23 0744  NA 137  K 4.4  CL 103  CO2 25  GLUCOSE 147*  BUN 26*  CREATININE 1.20  CALCIUM 8.8*   GFR: Estimated Creatinine Clearance: 35.1 mL/min (by C-G formula based on SCr of 1.2 mg/dL). Liver Function Tests: Recent Labs  Lab 07/29/23 0744  AST 26  ALT 20  ALKPHOS 59  BILITOT 0.5  PROT 6.6  ALBUMIN 3.8   No results for input(s): "LIPASE",  "AMYLASE" in the last 168 hours. No results for input(s): "AMMONIA" in the last 168 hours. Coagulation Profile: No results for input(s): "INR", "PROTIME" in the last 168 hours. Cardiac Enzymes: No results for input(s): "CKTOTAL", "CKMB", "CKMBINDEX", "TROPONINI" in the last 168 hours. BNP (last 3 results) No results for input(s): "PROBNP" in the last 8760 hours. HbA1C: No results for input(s): "HGBA1C" in the last 72 hours. CBG: No results for input(s): "GLUCAP" in the last 168 hours. Lipid Profile: No results for input(s): "CHOL", "HDL", "LDLCALC", "TRIG", "CHOLHDL", "LDLDIRECT" in the last 72 hours. Thyroid Function Tests: No results for input(s): "TSH", "T4TOTAL", "FREET4", "T3FREE", "THYROIDAB" in the last 72 hours. Anemia Panel: No results for input(s): "VITAMINB12", "FOLATE", "FERRITIN", "TIBC", "IRON", "RETICCTPCT" in the last 72 hours.  Urine analysis:    Component Value Date/Time   COLORURINE YELLOW (A) 07/29/2023 0743   APPEARANCEUR HAZY (A) 07/29/2023 0743   APPEARANCEUR Clear 03/14/2023 1010   LABSPEC 1.019 07/29/2023 0743   LABSPEC 1.021 01/29/2012 1510   PHURINE 5.0 07/29/2023 0743   GLUCOSEU NEGATIVE 07/29/2023 0743   GLUCOSEU Negative 01/29/2012 1510   HGBUR SMALL (A) 07/29/2023 0743   BILIRUBINUR NEGATIVE 07/29/2023 0743   BILIRUBINUR Negative 03/14/2023 1010   BILIRUBINUR Negative 01/29/2012 1510   KETONESUR NEGATIVE 07/29/2023 0743   PROTEINUR NEGATIVE 07/29/2023 0743   NITRITE NEGATIVE 07/29/2023 0743   LEUKOCYTESUR SMALL (A) 07/29/2023 0743   LEUKOCYTESUR Negative 01/29/2012 1510    Radiological Exams on Admission: CT Head Wo Contrast Result Date: 07/29/2023 CLINICAL DATA:  Facial trauma, blunt. Patient fell getting out of bed. No loss of consciousness. Patient did not strike his head. The patient takes warfarin. EXAM: CT HEAD WITHOUT CONTRAST TECHNIQUE: Contiguous axial images were obtained from the base of the skull through the vertex without  intravenous contrast. RADIATION DOSE REDUCTION: This exam was performed according to the departmental dose-optimization program which includes automated exposure control, adjustment of the mA and/or kV according to patient size and/or use of iterative reconstruction technique. COMPARISON:  CT head without contrast 12/03/2015 FINDINGS: Brain: No acute infarct, hemorrhage, or mass lesion is present. Mild atrophy and white matter changes are stable, likely within normal limits for age. The ventricles are proportionate to the degree of atrophy. Deep brain nuclei are within normal limits. No significant extraaxial fluid collection is present. The brainstem and cerebellum are within normal limits. Midline structures are within normal limits. Vascular: Atherosclerotic calcifications are present within the cavernous internal carotid arteries and at the dural margin of both vertebral arteries. No hyperdense vessels are present. Skull: Calvarium is intact. No focal lytic or blastic lesions are present. No significant extracranial soft tissue lesion is present. Sinuses/Orbits: The paranasal sinuses and mastoid air cells are clear. Bilateral lens replacements are noted. Globes and orbits are otherwise unremarkable. IMPRESSION: 1. No acute intracranial abnormality or significant interval change. 2. Stable atrophy and white matter disease, likely within normal limits for age. Electronically Signed   By: Marin Roberts M.D.   On: 07/29/2023 12:26   DG Chest 2 View Result Date: 07/29/2023 CLINICAL DATA:  89 year old male with history of fever. EXAM: CHEST - 2 VIEW COMPARISON:  Chest x-ray 07/05/2022. FINDINGS: Lung volumes are normal. No consolidative airspace disease. No definite pleural effusions. No pneumothorax. No evidence of pulmonary edema. Heart size is mildly enlarged. Upper mediastinal contours are within normal limits. Atherosclerotic calcifications in the thoracic aorta. Left-sided pacemaker/AICD device in  place with lead tips projecting over the expected location of the right atrium and right ventricle. IMPRESSION: 1. No radiographic evidence of acute cardiopulmonary disease. 2. Cardiomegaly. 3. Aortic atherosclerosis. Electronically Signed   By: Trudie Reed M.D.   On: 07/29/2023 08:16    EKG: Independently reviewed.  Sinus rhythm, chronic LBBB  Assessment/Plan Principal Problem:   Sepsis (HCC) Active Problems:   Atrial fibrillation (HCC)   Sepsis secondary to UTI [facility])  (please populate well all problems here in Problem List. (For example, if patient is on BP meds at home and you resume or decide to hold them, it is a problem that needs to be her. Same for CAD, COPD, HLD and so on)  Sepsis -Evidenced by new onset fever, tachycardia elevated lactate acid level, without symptoms signs of acute endorgan damage, source of infection is  new onset of UTI. -Patient appears to be euvolemic, given he has significant history of reduced EF CHF, will hold off further IV fluids. -Continue ceftriaxone -Check PVR, patient does admit that he has increased nocturnal urination and hesitancy.  Recommend patient follow-up with urology for urodynamic study.  Will start patient with Flomax.  PAF -In sinus rhythm -Continue amiodarone -Consult pharmacy for Coumadin  Chronic HFrEF HTN -Stable, continue beta-blocker  Hypothyroidism -Continue Synthroid  Fall Deconditioning -PT OT evaluation  Moderate protein calorie malnutrition -BMI= 17 -As per family, patient weight has been stable, he is on protein supplement  DVT prophylaxis: Coumadin Code Status: Full code Family Communication: Daughter at bedside Disposition Plan: Patient is sick with sepsis secondary to UTI requiring IV antibiotics and close monitoring clinical progress, expect more than 2 midnight hospital stay Consults called: None Admission status: Tele admit   Emeline General MD [facility] Pager [phone]  07/29/2023, 1:37 PM

## 2023-07-30 DIAGNOSIS — N390 Urinary tract infection, site not specified: Secondary | ICD-10-CM | POA: Diagnosis not present

## 2023-07-30 DIAGNOSIS — A419 Sepsis, unspecified organism: Secondary | ICD-10-CM | POA: Diagnosis not present

## 2023-07-30 LAB — CBC
HCT: 36.3 % — ABNORMAL LOW (ref 39.0–52.0)
Hemoglobin: 11.9 g/dL — ABNORMAL LOW (ref 13.0–17.0)
MCH: 31.6 pg (ref 26.0–34.0)
MCHC: 32.8 g/dL (ref 30.0–36.0)
MCV: 96.5 fL (ref 80.0–100.0)
Platelets: 124 10*3/uL — ABNORMAL LOW (ref 150–400)
RBC: 3.76 MIL/uL — ABNORMAL LOW (ref 4.22–5.81)
RDW: 13.5 % (ref 11.5–15.5)
WBC: 5.8 10*3/uL (ref 4.0–10.5)
nRBC: 0 % (ref 0.0–0.2)

## 2023-07-30 LAB — PROTIME-INR
INR: 3.1 — ABNORMAL HIGH (ref 0.8–1.2)
Prothrombin Time: 31.8 s — ABNORMAL HIGH (ref 11.4–15.2)

## 2023-07-30 LAB — TSH: TSH: 1.48 u[IU]/mL (ref 0.350–4.500)

## 2023-07-30 LAB — T4, FREE: Free T4: 1.32 ng/dL — ABNORMAL HIGH (ref 0.61–1.12)

## 2023-07-30 MED ORDER — WARFARIN 1.25 MG HALF TABLET
1.2500 mg | ORAL_TABLET | Freq: Once | ORAL | Status: AC
  Administered 2023-07-30: 1.25 mg via ORAL
  Filled 2023-07-30 (×2): qty 1

## 2023-07-30 MED ORDER — SODIUM CHLORIDE 0.9 % IV BOLUS
1000.0000 mL | Freq: Once | INTRAVENOUS | Status: AC
  Administered 2023-07-30: 1000 mL via INTRAVENOUS

## 2023-07-30 MED ORDER — MIDODRINE HCL 5 MG PO TABS
10.0000 mg | ORAL_TABLET | Freq: Two times a day (BID) | ORAL | Status: DC
  Administered 2023-07-30 (×2): 10 mg via ORAL
  Filled 2023-07-30 (×2): qty 2

## 2023-07-30 MED ORDER — BUPROPION HCL 75 MG PO TABS
75.0000 mg | ORAL_TABLET | Freq: Two times a day (BID) | ORAL | Status: DC
  Administered 2023-07-30 – 2023-08-01 (×5): 75 mg via ORAL
  Filled 2023-07-30 (×6): qty 1

## 2023-07-30 NOTE — Progress Notes (Signed)
PROGRESS NOTE    Jack Harris  ZOX:[id number] DOB: [birth year] DOA: 07/29/2023 PCP: Gracelyn Nurse, MD    Brief Narrative:   Jack Harris is a 89 y.o. male with past medical history significant for paroxysmal atrial fibrillation on Eliquis, chronic systolic congestive heart failure (LVEF 40%), cardiomyopathy s/p AICD, moderate pulmonary HTN, hypothyroidism, vocal cord paralysis with chronic hoarseness who presented to [facility] ED on 12/15 with generalized weakness, dysuria and fall at home.  Patient reports onset of generalized weakness day of admission with associated nausea/vomiting x 1, dizziness while standing.  Patient reportedly fell, denies striking his head or loss of consciousness.  No seizure-like activity was reported.  Family member checked his temperature which was reported as 101 and gave him 1 dose of Tylenol.  Patient denies cough, no abdominal pain, no chest pain and no diarrhea.  In the ED, temperature 101.8 F, HR 101, RR 16, BP 125/69, SpO2 94% on room air.  WBC 7.8, hemoglobin 13.0, platelet 184.  Sodium 137, potassium 4.4, chloride 103, CO2 25, glucose 147, BUN 26, Cram 1.20.  AST 26, ALT 20, total bilirubin 0.5.  Lactic acid 2.0.  Urinalysis with small leukocytes, negative nitrite, 21-50 WBCs.  CT head without contrast with no acute intracranial abnormality, stable atrophy and white matter disease likely within normal limits for age.  Chest x-ray with no radiographic evidence of acute cardiopulmonary disease, noted cardiomegaly and aortic atherosclerosis.  Urine culture and blood cultures x 2 obtained.  Patient started on ceftriaxone.  EDP consulted [facility] for admission.  Assessment & Plan:    Sepsis, POA Urinary tract infection Patient presenting with weakness, nausea/vomiting.  Temperature 101.8 F, HR 101.  Lactic acid 2.0.  Urinalysis with small leukocytes, negative nitrite, 21-50 WBCs. -- Urine culture: Pending -- Blood cultures x 2: No growth less than 24 hours --  Ceftriaxone 1 g IV every 24 hours -- Supportive care, antiemetics, antipyretics  Orthostatic hypotension While working with therapy today, patient was noted to have significant drop in his blood pressure with associated dizziness. -- Midodrine 10 mg p.o. twice daily -- NS 1L bolus -- Encourage increased oral intake -- Repeat orthostatic vital signs in the a.m.  Paroxysmal atrial fibrillation Chronic systolic congestive heart failure, compensated Cardiomyopathy s/p AICD -- Amiodarone 200 mg p.o. nightly -- Metoprolol succinate 12.5 mg p.o. nightly --Continue warfarin, pharmacy consulted for dosing/monitoring (goal INR 2-3) -- INR daily  HLD -- Atorvastatin 10 mg p.o. daily  Hypothyroidism -- Levothyroxine 100 mcg p.o. daily  Anxiety/depression --Wellbutrin 75 mg p.o. BID  BPH --Tamsulosin 0.4 mg p.o. nightly  Weakness/debility/deconditioning/gait disturbance: -- PT recommends home health -- OT: No needs identified  DVT prophylaxis: Warfarin    Code Status: Full Code Family Communication: No family present at bedside this morning  Disposition Plan:  Level of care: Telemetry Medical Status is: Inpatient Remains inpatient appropriate because: IV antibiotics, IV fluid hydration    Consultants:  None  Procedures:  None  Antimicrobials:  Ceftriaxone 12/15>>   Subjective: Patient seen examined bedside, resting calmly.  Lying in bed.  Remains in ED holding area.  Nausea and vomiting now resolved.  Remains on IV antibiotics.  Worked with PT this morning, positive orthostasis, starting IV fluid bolus.  No other specific questions, concerns or complaints at this time.  Denies headache, no dizziness, no chest pain, no palpitations, no shortness of breath, no abdominal pain, no current fever, no chills/night sweats, no focal weakness, no cough, no paresthesias.  No acute  events overnight per nursing staff.  Objective: Vitals:   07/30/23 1520 07/30/23 1600 07/30/23 1620  07/30/23 1638  BP: 105/70 96/74 (!) 91/58 104/62  Pulse: 98 (!) 109 (!) 110 94  Resp: 14  18   Temp:   98 F (36.7 C) 98.8 F (37.1 C)  TempSrc:   Oral   SpO2: 100% 100% 100% 98%  Weight:        Intake/Output Summary (Last 24 hours) at 07/30/2023 1652 Last data filed at 07/30/2023 1109 Gross per 24 hour  Intake 1000 ml  Output --  Net 1000 ml   Filed Weights   07/29/23 0740  Weight: 57.2 kg    Examination:  Physical Exam: GEN: NAD, alert and oriented x 3, chronically ill/elderly in appearance, HEENT: NCAT, PERRL, EOMI, sclera clear, dry mucous membranes PULM: CTAB w/o wheezes/crackles, normal respiratory effort, on room air with SpO2 96% at rest CV: RRR w/o M/G/R GI: abd soft, NTND, NABS, no R/G/M MSK: no peripheral edema, moves all extremities dependently NEURO: No focal neurological deficits PSYCH: normal mood/affect Integumentary: No concerning rashes/lesions/wounds noted on exposed skin surfaces    Data Reviewed: I have personally reviewed following labs and imaging studies  CBC: Recent Labs  Lab 07/29/23 0744 07/30/23 0546  WBC 7.8 5.8  NEUTROABS 6.7  --   HGB 13.0 11.9*  HCT 39.1 36.3*  MCV 93.3 96.5  PLT 184 124*   Basic Metabolic Panel: Recent Labs  Lab 07/29/23 0744  NA 137  K 4.4  CL 103  CO2 25  GLUCOSE 147*  BUN 26*  CREATININE 1.20  CALCIUM 8.8*   GFR: Estimated Creatinine Clearance: 35.1 mL/min (by C-G formula based on SCr of 1.2 mg/dL). Liver Function Tests: Recent Labs  Lab 07/29/23 0744  AST 26  ALT 20  ALKPHOS 59  BILITOT 0.5  PROT 6.6  ALBUMIN 3.8   No results for input(s): "LIPASE", "AMYLASE" in the last 168 hours. No results for input(s): "AMMONIA" in the last 168 hours. Coagulation Profile: Recent Labs  Lab 07/29/23 0744 07/30/23 0546  INR 2.6* 3.1*   Cardiac Enzymes: No results for input(s): "CKTOTAL", "CKMB", "CKMBINDEX", "TROPONINI" in the last 168 hours. BNP (last 3 results) No results for input(s):  "PROBNP" in the last 8760 hours. HbA1C: No results for input(s): "HGBA1C" in the last 72 hours. CBG: No results for input(s): "GLUCAP" in the last 168 hours. Lipid Profile: No results for input(s): "CHOL", "HDL", "LDLCALC", "TRIG", "CHOLHDL", "LDLDIRECT" in the last 72 hours. Thyroid Function Tests: Recent Labs    07/30/23 0546  TSH 1.480  FREET4 1.32*   Anemia Panel: No results for input(s): "VITAMINB12", "FOLATE", "FERRITIN", "TIBC", "IRON", "RETICCTPCT" in the last 72 hours. Sepsis Labs: Recent Labs  Lab 07/29/23 0744 07/29/23 1239  LATICACIDVEN 2.0* 1.2    Recent Results (from the past 240 hours)  SARS Coronavirus 2 by RT PCR (hospital order, performed in [facility] hospital lab) *cepheid single result test* Urine, Clean Catch     Status: None   Collection Time: 07/29/23  7:43 AM   Specimen: Urine, Clean Catch; Nasal Swab  Result Value Ref Range Status   SARS Coronavirus 2 by RT PCR NEGATIVE NEGATIVE Final    Comment: (NOTE) SARS-CoV-2 target nucleic acids are NOT DETECTED.  The SARS-CoV-2 RNA is generally detectable in upper and lower respiratory specimens during the acute phase of infection. The lowest concentration of SARS-CoV-2 viral copies this assay can detect is 250 copies / mL. A negative result does  not preclude SARS-CoV-2 infection and should not be used as the sole basis for treatment or other patient management decisions.  A negative result may occur with improper specimen collection / handling, submission of specimen other than nasopharyngeal swab, presence of viral mutation(s) within the areas targeted by this assay, and inadequate number of viral copies (<250 copies / mL). A negative result must be combined with clinical observations, patient history, and epidemiological information.  Fact Sheet for Patients:   RoadLapTop.co.za  Fact Sheet for Healthcare Providers: http://kim-miller.com/  This test is  not yet approved or  cleared by the [place] FDA and has been authorized for detection and/or diagnosis of SARS-CoV-2 by FDA under an Emergency Use Authorization (EUA).  This EUA will remain in effect (meaning this test can be used) for the duration of the COVID-19 declaration under Section 564(b)(1) of the Act, 21 U.S.C. section 360bbb-3(b)(1), unless the authorization is terminated or revoked sooner.  Performed at [facility], [address] [place], [place] [address]   Urine Culture     Status: None (Preliminary result)   Collection Time: 07/29/23  7:43 AM   Specimen: Urine, Random  Result Value Ref Range Status   Specimen Description   Final    URINE, RANDOM Performed at [facility], [address] [place], [place] [address]    Special Requests   Final    NONE Reflexed from [phone] Performed at [facility], [address], [place], [place] [address]    Culture   Final    CULTURE REINCUBATED FOR BETTER GROWTH Performed at [facility], [address], [place], [place] [address]    Report Status PENDING  Incomplete  Blood culture (routine x 2)     Status: None (Preliminary result)   Collection Time: 07/29/23 12:39 PM   Specimen: BLOOD  Result Value Ref Range Status   Specimen Description BLOOD BLOOD RIGHT FOREARM  Final   Special Requests   Final    BOTTLES DRAWN AEROBIC AND ANAEROBIC Blood Culture results may not be optimal due to an inadequate volume of blood received in culture bottles   Culture   Final    NO GROWTH < 24 HOURS Performed at [facility], [address], [place], [place] [address]    Report Status PENDING  Incomplete  Blood culture (routine x 2)     Status: None (Preliminary result)   Collection Time: 07/29/23 12:39 PM   Specimen: BLOOD LEFT HAND  Result Value Ref Range Status   Specimen Description BLOOD LEFT HAND  Final   Special Requests   Final    BOTTLES DRAWN AEROBIC AND ANAEROBIC Blood  Culture results may not be optimal due to an inadequate volume of blood received in culture bottles   Culture   Final    NO GROWTH < 24 HOURS Performed at [facility], [address] [place], [place] [address]    Report Status PENDING  Incomplete         Radiology Studies: CT Head Wo Contrast Result Date: 07/29/2023 CLINICAL DATA:  Facial trauma, blunt. Patient fell getting out of bed. No loss of consciousness. Patient did not strike his head. The patient takes warfarin. EXAM: CT HEAD WITHOUT CONTRAST TECHNIQUE: Contiguous axial images were obtained from the base of the skull through the vertex without intravenous contrast. RADIATION DOSE REDUCTION: This exam was performed according to the departmental dose-optimization program which includes automated exposure control, adjustment of the mA and/or kV  according to patient size and/or use of iterative reconstruction technique. COMPARISON:  CT head without contrast 12/03/2015 FINDINGS: Brain: No acute infarct, hemorrhage, or mass lesion is present. Mild atrophy and white matter changes are stable, likely within normal limits for age. The ventricles are proportionate to the degree of atrophy. Deep brain nuclei are within normal limits. No significant extraaxial fluid collection is present. The brainstem and cerebellum are within normal limits. Midline structures are within normal limits. Vascular: Atherosclerotic calcifications are present within the cavernous internal carotid arteries and at the dural margin of both vertebral arteries. No hyperdense vessels are present. Skull: Calvarium is intact. No focal lytic or blastic lesions are present. No significant extracranial soft tissue lesion is present. Sinuses/Orbits: The paranasal sinuses and mastoid air cells are clear. Bilateral lens replacements are noted. Globes and orbits are otherwise unremarkable. IMPRESSION: 1. No acute intracranial abnormality or significant interval change. 2.  Stable atrophy and white matter disease, likely within normal limits for age. Electronically Signed   By: Marin Roberts M.D.   On: 07/29/2023 12:26   DG Chest 2 View Result Date: 07/29/2023 CLINICAL DATA:  89 year old male with history of fever. EXAM: CHEST - 2 VIEW COMPARISON:  Chest x-ray 07/05/2022. FINDINGS: Lung volumes are normal. No consolidative airspace disease. No definite pleural effusions. No pneumothorax. No evidence of pulmonary edema. Heart size is mildly enlarged. Upper mediastinal contours are within normal limits. Atherosclerotic calcifications in the thoracic aorta. Left-sided pacemaker/AICD device in place with lead tips projecting over the expected location of the right atrium and right ventricle. IMPRESSION: 1. No radiographic evidence of acute cardiopulmonary disease. 2. Cardiomegaly. 3. Aortic atherosclerosis. Electronically Signed   By: Trudie Reed M.D.   On: 07/29/2023 08:16        Scheduled Meds:  amiodarone  200 mg Oral QPM   atorvastatin  10 mg Oral q1800   buPROPion  75 mg Oral BID   feeding supplement  237 mL Oral BID BM   levothyroxine  100 mcg Oral Q0600   metoprolol succinate  12.5 mg Oral QPM   midodrine  10 mg Oral BID WC   tamsulosin  0.4 mg Oral QPC supper   Warfarin - Pharmacist Dosing Inpatient   Does not apply q1600   Continuous Infusions:  cefTRIAXone (ROCEPHIN)  IV Stopped (07/30/23 1049)     LOS: 1 day    Time spent: 53 minutes spent on chart review, discussion with nursing staff, consultants, updating family and interview/physical exam; more than 50% of that time was spent in counseling and/or coordination of care.    Alvira Philips [place], DO [facility] Available via Epic secure chat 7am-7pm After these hours, please refer to coverage provider listed on [email or web address] 07/30/2023, 4:52 PM

## 2023-07-30 NOTE — Plan of Care (Signed)

## 2023-07-30 NOTE — Consult Note (Addendum)
PHARMACY - ANTICOAGULATION CONSULT NOTE  Pharmacy Consult for Warfarin Indication: atrial fibrillation  Allergies  Allergen Reactions   Ciprofloxacin Hives   Levaquin [Levofloxacin In D5w] Hives   Patient Measurements: Weight: 57.2 kg (126 lb 1.7 oz)  Vital Signs: Temp: 98.8 F (37.1 C) (12/16 0452) Temp Source: Oral (12/16 0452) BP: 98/71 (12/16 0700) Pulse Rate: 110 (12/16 0700)  Labs: Recent Labs    07/29/23 0744 07/30/23 0546  HGB 13.0 11.9*  HCT 39.1 36.3*  PLT 184 124*  LABPROT 27.8* 31.8*  INR 2.6* 3.1*  CREATININE 1.20  --    Estimated Creatinine Clearance: 35.1 mL/min (by C-G formula based on SCr of 1.2 mg/dL).  Medical History: Past Medical History:  Diagnosis Date   AICD (automatic cardioverter/defibrillator) present    MEDTRONIC   Aortic insufficiency    Asthma    childhood asthma   Atrial fibrillation (HCC)    Cancer (HCC)    bladder, 10 yrs ago   Cancer (HCC)    squamous cell carcinoma   Cardiomyopathy (HCC)    CHF (congestive heart failure) (HCC)    Hyperlipidemia    Hypothyroidism    Paralysis of vocal cords and larynx, unilateral    Shortness of breath dyspnea    Medications:  Home regimen was confirmed with patient as alternating between 1.25 mg and 2.5 mg daily - last dose was 2.5 mg taken 12/14 PM   DDI:  PTA meds - amiodarone   Assessment: Jack Harris is an 89 year old male that presented with generalized weakness. Reports that he work up this morning and fell getting out of bed. PMH is significant for atrial fibrillation for which he takes warfarin. Patient took his last dose on 12/14 PM. Pharmacy has been consulted for management of warfarin.   Goal of Therapy:  INR 2-3 Monitor platelets by anticoagulation protocol: Yes  INR is slightly above goal at 3.1. CBC trended down - hgb 11.9, plt 124. Per RN, no signs/symptoms of bleeding noted.   Of note, patient resumed home amiodarone 200 mg daily - first dose on 12/15 at 1730.  Given that patient's home warfarin regimen was resumed on 12/15, patient should receive 2.5 mg today, however, due to INR trending slightly above goal, will repeat 1.25 mg dose and recheck INR in AM.   Plan:  Give warfarin 1.25 mg x1 this evening Monitor INR daily while impatient Monitor CBC and signs/symptoms of bleeding  Thank you for involving pharmacy in this patient's care.   Rockwell Alexandria, PharmD Clinical Pharmacist 07/30/2023 10:15 AM

## 2023-07-30 NOTE — Evaluation (Signed)
Physical Therapy Evaluation Patient Details Name: Jack Harris MRN: [id number] DOB: 1934-07-05 Today's Date: 07/30/2023  History of Present Illness  Jack Harris is a 89yoM who comes to [facility] on 07/29/23 with weakness, dysuria, falls at home. Emesis, fever, orthostatic dizziness. PMH: PAF on eliquis, HFrEF, AICD, pulmonary hypertension. Pt admitted with UTI sepsis.  Clinical Impression  Pt in bed on entry in ED, agreeable to session. Pt reports feeling better. Per pt reports, he sustained a fall prior to admission, generally weak, then his legs would no longer support him. Today pt has 5 BPs on monitor on arrival in 90s/70s range, he is unable to remark on normalcy of this. BP drops during modI bed mobility and standing bedside balance screening. Pt has no symptoms during his 70s/50s BPM. Pt asks to AMB to toilet, which is tolerated surprisingly well, no device needed, although he remain unsure about his current balance. PT has strong care support at home, well poised for a home DC, can start rehab their with HHPT, but may need minimal services. Will continue to follow.       If plan is discharge home, recommend the following: A little help with walking and/or transfers;Assist for transportation;Assistance with cooking/housework;Help with stairs or ramp for entrance   Can travel by private vehicle        Equipment Recommendations None recommended by PT  Recommendations for Other Services       Functional Status Assessment Patient has had a recent decline in their functional status and demonstrates the ability to make significant improvements in function in a reasonable and predictable amount of time.     Precautions / Restrictions Precautions Precautions: Fall Restrictions Weight Bearing Restrictions Per Provider Order: No      Mobility  Bed Mobility Overal bed mobility: Modified Independent                  Transfers Overall transfer level: Needs assistance Equipment  used: None Transfers: Sit to/from Stand Sit to Stand: Supervision, From elevated surface (ED gurney)                Ambulation/Gait   Gait Distance (Feet): 18 Feet (78ft;; 63ft)           General Gait Details: asked to AMB to toilet at end of session; no additional AMB performed due to orthostatic and hypotensive presentation  Stairs            Wheelchair Mobility     Tilt Bed    Modified Rankin (Stroke Patients Only)       Balance Overall balance assessment: Modified Independent, History of Falls (able to stand with eyes closes; alternate forward adn backward, alternate side stepping)                                           Pertinent Vitals/Pain Pain Assessment Pain Assessment: No/denies pain    Home Living Family/patient expects to be discharged to:: Private residence Living Arrangements: Alone (wife passed away in 06-03-23) Available Help at Discharge: Personal care attendant;Available 24 hours/day Type of Home: Apartment Home Access: Ramped entrance       Home Layout: One level Home Equipment: Agricultural consultant (2 wheels);Cane - single point;Shower seat      Prior Function  Extremity/Trunk Assessment                Communication      Cognition Arousal: Alert Behavior During Therapy: WFL for tasks assessed/performed Overall Cognitive Status: Within Functional Limits for tasks assessed                                          General Comments      Exercises     Assessment/Plan    PT Assessment Patient needs continued PT services  PT Problem List Decreased strength;Decreased range of motion;Decreased activity tolerance;Decreased balance;Decreased mobility;Decreased coordination       PT Treatment Interventions DME instruction;Gait training;Stair training;Functional mobility training;Therapeutic activities    PT Goals (Current goals can be found in the  Care Plan section)  Acute Rehab PT Goals Patient Stated Goal: be able to recover so he can keep her [place] PT Goal Formulation: With patient Time For Goal Achievement: 08/13/23 Potential to Achieve Goals: Good    Frequency Min 1X/week     Co-evaluation               AM-PAC PT "6 Clicks" Mobility  Outcome Measure Help needed turning from your back to your side while in a flat bed without using bedrails?: A Little Help needed moving from lying on your back to sitting on the side of a flat bed without using bedrails?: A Little Help needed moving to and from a bed to a chair (including a wheelchair)?: A Little Help needed standing up from a chair using your arms (e.g., wheelchair or bedside chair)?: A Little Help needed to walk in hospital room?: A Little Help needed climbing 3-5 steps with a railing? : A Little 6 Click Score: 18    End of Session   Activity Tolerance: Patient tolerated treatment well;No increased pain;Treatment limited secondary to medical complications (Comment) Patient left: in bed Nurse Communication: Mobility status PT Visit Diagnosis: Unsteadiness on feet (R26.81);Other abnormalities of gait and mobility (R26.89);History of falling (Z91.81)    Time: 1610-9604 PT Time Calculation (min) (ACUTE ONLY): 22 min   Charges:   PT Evaluation $PT Eval Low Complexity: 1 Low   PT General Charges $$ ACUTE PT VISIT: 1 Visit        11:21 AM, 07/30/23 Rosamaria Lints, PT, DPT Physical Therapist - [facility]  [phone] (ASCOM)    Jack Harris 07/30/2023, 11:18 AM

## 2023-07-30 NOTE — Evaluation (Signed)
Occupational Therapy Evaluation Patient Details Name: Jack Harris MRN: [id number] DOB: 1934-02-12 Today's Date: 07/30/2023   History of Present Illness Rj Starzynski is a 89yoM who comes to [facility] on 07/29/23 with weakness, dysuria, falls at home. Emesis, fever, orthostatic dizziness. PMH: PAF on eliquis, HFrEF, AICD, pulmonary hypertension. Pt admitted with UTI sepsis.   Clinical Impression   Pt was seen for OT evaluation this date. Prior to hospital admission, pt lives at home alone, but has 24/7 PCA services and family who check in occasionally. Reports IND at [facility], active lifestyle, traveling to [place] with daughter, golfing, etc.  Pt presents to acute OT demonstrating no functional decline or impaired ADL performance. He performed bed mobility with MOD I, STS with MOD I from high gurney and maintained static standing balance x5 mins with SUP with no LOB noted. BP monitored for orthostatics and was not positive. Lowest reading of 88/63 with pt asymptomatic. BP improved to 98/71 with return to supine. He reports being at baseline with ADL performance with no needs for acute OT services or follow up services upon return home. OT to sign off in house and can be re consulted if further needs arise.        If plan is discharge home, recommend the following: A little help with walking and/or transfers    Functional Status Assessment  Patient has not had a recent decline in their functional status  Equipment Recommendations  None recommended by OT    Recommendations for Other Services       Precautions / Restrictions Precautions Precautions: Fall Restrictions Weight Bearing Restrictions Per Provider Order: No      Mobility Bed Mobility Overal bed mobility: Modified Independent                  Transfers Overall transfer level: Needs assistance Equipment used: None Transfers: Sit to/from Stand Sit to Stand: Supervision, From elevated surface           General  transfer comment: SUP for ST from high gurney and able to stand for 5 mins with no trouble then take lateral steps to L and R at EOB with no LOB and SUP      Balance Overall balance assessment: Modified Independent, History of Falls                                         ADL either performed or assessed with clinical judgement   ADL Overall ADL's : At baseline                                       General ADL Comments: pt reports he is at baseline with ADL function at this time-went to the bathroom with PT during their evaluation     Vision         Perception         Praxis         Pertinent Vitals/Pain Pain Assessment Pain Assessment: No/denies pain     Extremity/Trunk Assessment Upper Extremity Assessment Upper Extremity Assessment: Overall WFL for tasks assessed   Lower Extremity Assessment Lower Extremity Assessment: Defer to PT evaluation       Communication Communication Communication: No apparent difficulties   Cognition Arousal: Alert Behavior During Therapy: WFL for tasks assessed/performed Overall Cognitive Status:  Within Functional Limits for tasks assessed                                       General Comments  BP improved from this AM; dropped lowest to 88/63 with 3-4 min standing at EOB and increased back to 98/71 with return to supine    Exercises Other Exercises Other Exercises: Edu in role of OT in acute setting with pt stated he was able to perform his ADLs at baseline with no current OT needs.   Shoulder Instructions      Home Living Family/patient expects to be discharged to:: Private residence Living Arrangements: Alone (wife passed away in 06-01-2023) Available Help at Discharge: Personal care attendant;Available 24 hours/day Type of Home: Apartment Home Access: Ramped entrance     Home Layout: One level     Bathroom Shower/Tub: Producer, television/film/video:  Standard     Home Equipment: Agricultural consultant (2 wheels);Cane - single point;Shower seat - built in          Prior Functioning/Environment Prior Level of Function : Independent/Modified Independent             Mobility Comments: reports IND with mobility ADLs Comments: reports IND with ADLs/IADLs        OT Problem List:        OT Treatment/Interventions:      OT Goals(Current goals can be found in the care plan section)    OT Frequency:      Co-evaluation              AM-PAC OT "6 Clicks" Daily Activity     Outcome Measure Help from another person eating meals?: None Help from another person taking care of personal grooming?: None Help from another person toileting, which includes using toliet, bedpan, or urinal?: None Help from another person bathing (including washing, rinsing, drying)?: None Help from another person to put on and taking off regular upper body clothing?: None Help from another person to put on and taking off regular lower body clothing?: None 6 Click Score: 24   End of Session Nurse Communication: Mobility status  Activity Tolerance: Patient tolerated treatment well Patient left: in bed;with call bell/phone within reach;with nursing/sitter in room  OT Visit Diagnosis: Other abnormalities of gait and mobility (R26.89)                Time: 3875-6433 OT Time Calculation (min): 28 min Charges:  OT General Charges $OT Visit: 1 Visit OT Evaluation $OT Eval Low Complexity: 1 Low OT Treatments $Therapeutic Activity: 8-22 mins Remedy Corporan, OTR/L 07/30/23, 12:42 PM  Donn Zanetti E Bianka Liberati 07/30/2023, 12:38 PM

## 2023-07-31 ENCOUNTER — Encounter: Payer: Self-pay | Admitting: Internal Medicine

## 2023-07-31 DIAGNOSIS — I951 Orthostatic hypotension: Secondary | ICD-10-CM

## 2023-07-31 DIAGNOSIS — R531 Weakness: Secondary | ICD-10-CM

## 2023-07-31 DIAGNOSIS — N390 Urinary tract infection, site not specified: Secondary | ICD-10-CM | POA: Diagnosis not present

## 2023-07-31 DIAGNOSIS — A419 Sepsis, unspecified organism: Secondary | ICD-10-CM | POA: Diagnosis not present

## 2023-07-31 LAB — BASIC METABOLIC PANEL
Anion gap: 8 (ref 5–15)
BUN: 19 mg/dL (ref 8–23)
CO2: 25 mmol/L (ref 22–32)
Calcium: 8.3 mg/dL — ABNORMAL LOW (ref 8.9–10.3)
Chloride: 104 mmol/L (ref 98–111)
Creatinine, Ser: 1.14 mg/dL (ref 0.61–1.24)
GFR, Estimated: >60 mL/min (ref >60)
Glucose, Bld: 92 mg/dL (ref 70–99)
Potassium: 4 mmol/L (ref 3.5–5.1)
Sodium: 137 mmol/L (ref 135–145)

## 2023-07-31 LAB — CBC
HCT: 36.2 % — ABNORMAL LOW (ref 39.0–52.0)
Hemoglobin: 11.9 g/dL — ABNORMAL LOW (ref 13.0–17.0)
MCH: 30.7 pg (ref 26.0–34.0)
MCHC: 32.9 g/dL (ref 30.0–36.0)
MCV: 93.3 fL (ref 80.0–100.0)
Platelets: 160 10*3/uL (ref 150–400)
RBC: 3.88 MIL/uL — ABNORMAL LOW (ref 4.22–5.81)
RDW: 13.6 % (ref 11.5–15.5)
WBC: 5.4 10*3/uL (ref 4.0–10.5)
nRBC: 0 % (ref 0.0–0.2)

## 2023-07-31 LAB — URINE CULTURE

## 2023-07-31 LAB — MAGNESIUM: Magnesium: 2 mg/dL (ref 1.7–2.4)

## 2023-07-31 LAB — PROTIME-INR
INR: 2.7 — ABNORMAL HIGH (ref 0.8–1.2)
Prothrombin Time: 29.3 s — ABNORMAL HIGH (ref 11.4–15.2)

## 2023-07-31 LAB — PHOSPHORUS: Phosphorus: 2.6 mg/dL (ref 2.5–4.6)

## 2023-07-31 MED ORDER — WARFARIN 1.25 MG HALF TABLET
1.2500 mg | ORAL_TABLET | Freq: Once | ORAL | Status: AC
  Administered 2023-07-31: 1.25 mg via ORAL
  Filled 2023-07-31: qty 1

## 2023-07-31 MED ORDER — SODIUM CHLORIDE 0.9 % IV BOLUS
250.0000 mL | Freq: Once | INTRAVENOUS | Status: AC
  Administered 2023-07-31: 250 mL via INTRAVENOUS

## 2023-07-31 MED ORDER — MIDODRINE HCL 5 MG PO TABS
10.0000 mg | ORAL_TABLET | Freq: Three times a day (TID) | ORAL | Status: DC
  Administered 2023-07-31 – 2023-08-01 (×5): 10 mg via ORAL
  Filled 2023-07-31 (×5): qty 2

## 2023-07-31 NOTE — Consult Note (Signed)
PHARMACY - ANTICOAGULATION CONSULT NOTE  Pharmacy Consult for Warfarin Indication: atrial fibrillation  Allergies  Allergen Reactions   Ciprofloxacin Hives   Levaquin [Levofloxacin In D5w] Hives   Patient Measurements: Weight: 57.2 kg (126 lb 1.7 oz)  Vital Signs: Temp: 98.9 F (37.2 C) (12/17 0757) Temp Source: Oral (12/17 0757) BP: 90/56 (12/17 0757) Pulse Rate: 88 (12/17 0757)  Labs: Recent Labs    07/29/23 0744 07/30/23 0546 07/31/23 0403  HGB 13.0 11.9* 11.9*  HCT 39.1 36.3* 36.2*  PLT 184 124* 160  LABPROT 27.8* 31.8* 29.3*  INR 2.6* 3.1* 2.7*  CREATININE 1.20  --  1.14   Estimated Creatinine Clearance: 36.9 mL/min (by C-G formula based on SCr of 1.14 mg/dL).  Medications:  Home regimen was confirmed with patient as alternating between 1.25 mg and 2.5 mg daily - last dose was 2.5 mg taken 12/14 PM   DDI:  PTA meds - amiodarone   Assessment: Jack Harris is an 89 year old male that presented with generalized weakness. Reports that he work up this morning and fell getting out of bed. PMH is significant for atrial fibrillation for which he takes warfarin. Patient took his last dose on 12/14 PM prior to admission. Pharmacy has been consulted for management of warfarin.   Goal of Therapy:  INR 2-3 Monitor platelets by anticoagulation protocol: Yes  Per RN, no signs/symptoms of bleeding noted. Of note, patient resumed home amiodarone 200 mg daily - first dose on 12/15 at 1730. Given that patient's home warfarin regimen was resumed on 12/15. INR    Date INR Warfarin dose Comments  12/15 2.6 1.25 mg Baseline DDI with amiodarone, UTI?, syncope  12/16 3.1 1.25 mg CBC stable  12/17 2.7           Plan:  Warfarin 1.25 mg x 1 this evening Monitor INR daily while impatient Monitor CBC and signs/symptoms of bleeding  Thank you for involving pharmacy in this patient's care.   Hermela Hardt Rodriguez-Guzman PharmD, BCPS 07/31/2023 9:59 AM

## 2023-07-31 NOTE — Progress Notes (Signed)
PROGRESS NOTE    Jack Harris  WGN:[id number] DOB: August 02, 1934 DOA: 07/29/2023 PCP: Gracelyn Nurse, MD    Brief Narrative:   Jack Harris is a 89 y.o. male with past medical history significant for paroxysmal atrial fibrillation on Eliquis, chronic systolic congestive heart failure (LVEF 40%), cardiomyopathy s/p AICD, moderate pulmonary HTN, hypothyroidism, vocal cord paralysis with chronic hoarseness who presented to [facility] ED on 12/15 with generalized weakness, dysuria and fall at home.  Patient reports onset of generalized weakness day of admission with associated nausea/vomiting x 1, dizziness while standing.  Patient reportedly fell, denies striking his head or loss of consciousness.  No seizure-like activity was reported.  Family member checked his temperature which was reported as 101 and gave him 1 dose of Tylenol.  Patient denies cough, no abdominal pain, no chest pain and no diarrhea.  In the ED, temperature 101.8 F, HR 101, RR 16, BP 125/69, SpO2 94% on room air.  WBC 7.8, hemoglobin 13.0, platelet 184.  Sodium 137, potassium 4.4, chloride 103, CO2 25, glucose 147, BUN 26, Cram 1.20.  AST 26, ALT 20, total bilirubin 0.5.  Lactic acid 2.0.  Urinalysis with small leukocytes, negative nitrite, 21-50 WBCs.  CT head without contrast with no acute intracranial abnormality, stable atrophy and white matter disease likely within normal limits for age.  Chest x-ray with no radiographic evidence of acute cardiopulmonary disease, noted cardiomegaly and aortic atherosclerosis.  Urine culture and blood cultures x 2 obtained.  Patient started on ceftriaxone.  EDP consulted [facility] for admission.  Assessment & Plan:    Sepsis, POA Urinary tract infection Patient presenting with weakness, nausea/vomiting.  Temperature 101.8 F, HR 101.  Lactic acid 2.0.  Urinalysis with small leukocytes, negative nitrite, 21-50 WBCs. -- Urine culture: 50K aerococcus -- Blood cultures x 2: No growth x 2 days --  Ceftriaxone 1 g IV every 24 hours -- Supportive care, antiemetics, antipyretics  Orthostatic hypotension While working with therapy, patient was noted to have significant drop in his blood pressure with associated dizziness.  Repeat orthostatic vital signs this morning improved but remains orthostatic -- Midodrine 10 mg p.o. increased to 3 times daily -- Repeat NS 1L bolus today -- Encourage increased oral intake -- Repeat orthostatic vital signs in the a.m.  Paroxysmal atrial fibrillation Chronic systolic congestive heart failure, compensated Cardiomyopathy s/p AICD -- Amiodarone 200 mg p.o. nightly -- Metoprolol succinate 12.5 mg p.o. nightly -- Continue warfarin, pharmacy consulted for dosing/monitoring (goal INR 2-3) -- INR daily; 2.7 today  HLD -- Atorvastatin 10 mg p.o. daily  Hypothyroidism -- Levothyroxine 100 mcg p.o. daily  Anxiety/depression --Wellbutrin 75 mg p.o. BID  BPH --Tamsulosin 0.4 mg p.o. nightly  Weakness/debility/deconditioning/gait disturbance: -- PT recommends home health; home health orders placed -- OT: No needs identified -- TOC consulted  DVT prophylaxis: Warfarin warfarin (COUMADIN) tablet 1.25 mg    Code Status: Full Code Family Communication: No family present at bedside this morning  Disposition Plan:  Level of care: Med-Surg Status is: Inpatient Remains inpatient appropriate because: IV antibiotics, IV fluid hydration    Consultants:  None  Procedures:  None  Antimicrobials:  Ceftriaxone 12/15>>   Subjective: Patient seen examined bedside, resting calmly.  Lying in bed.  Eating breakfast.  Remains orthostatic per vital signs this morning.  States dizziness much improved.  Remains on IV antibiotics.  Repeating IV fluid bolus today.  No other specific questions, concerns or complaints at this time.  Denies headache, no dizziness, no  chest pain, no palpitations, no shortness of breath, no abdominal pain, no current fever, no  chills/night sweats, no focal weakness, no cough, no paresthesias.  No acute events overnight per nursing staff.  Objective: Vitals:   07/30/23 1717 07/30/23 1718 07/30/23 2216 07/31/23 0757  BP: (!) 85/61 100/67 95/71 (!) 90/56  Pulse: (!) 108 (!) 106 86 88  Resp: 20  18 17   Temp: 97.7 F (36.5 C)  98.5 F (36.9 C) 98.9 F (37.2 C)  TempSrc: Oral   Oral  SpO2: 98% 100% 98% 96%  Weight:        Intake/Output Summary (Last 24 hours) at 07/31/2023 1234 Last data filed at 07/31/2023 0300 Gross per 24 hour  Intake 100 ml  Output --  Net 100 ml   Filed Weights   07/29/23 0740  Weight: 57.2 kg    Examination:  Physical Exam: GEN: NAD, alert and oriented x 3, chronically ill/elderly in appearance, HEENT: NCAT, PERRL, EOMI, sclera clear, dry mucous membranes PULM: CTAB w/o wheezes/crackles, normal respiratory effort, on room air with SpO2 96% at rest CV: RRR w/o M/G/R GI: abd soft, NTND, NABS, no R/G/M MSK: no peripheral edema, moves all extremities dependently NEURO: No focal neurological deficits PSYCH: normal mood/affect Integumentary: No concerning rashes/lesions/wounds noted on exposed skin surfaces    Data Reviewed: I have personally reviewed following labs and imaging studies  CBC: Recent Labs  Lab 07/29/23 0744 07/30/23 0546 07/31/23 0403  WBC 7.8 5.8 5.4  NEUTROABS 6.7  --   --   HGB 13.0 11.9* 11.9*  HCT 39.1 36.3* 36.2*  MCV 93.3 96.5 93.3  PLT 184 124* 160   Basic Metabolic Panel: Recent Labs  Lab 07/29/23 0744 07/31/23 0403  NA 137 137  K 4.4 4.0  CL 103 104  CO2 25 25  GLUCOSE 147* 92  BUN 26* 19  CREATININE 1.20 1.14  CALCIUM 8.8* 8.3*  MG  --  2.0  PHOS  --  2.6   GFR: Estimated Creatinine Clearance: 36.9 mL/min (by C-G formula based on SCr of 1.14 mg/dL). Liver Function Tests: Recent Labs  Lab 07/29/23 0744  AST 26  ALT 20  ALKPHOS 59  BILITOT 0.5  PROT 6.6  ALBUMIN 3.8   No results for input(s): "LIPASE", "AMYLASE" in  the last 168 hours. No results for input(s): "AMMONIA" in the last 168 hours. Coagulation Profile: Recent Labs  Lab 07/29/23 0744 07/30/23 0546 07/31/23 0403  INR 2.6* 3.1* 2.7*   Cardiac Enzymes: No results for input(s): "CKTOTAL", "CKMB", "CKMBINDEX", "TROPONINI" in the last 168 hours. BNP (last 3 results) No results for input(s): "PROBNP" in the last 8760 hours. HbA1C: No results for input(s): "HGBA1C" in the last 72 hours. CBG: No results for input(s): "GLUCAP" in the last 168 hours. Lipid Profile: No results for input(s): "CHOL", "HDL", "LDLCALC", "TRIG", "CHOLHDL", "LDLDIRECT" in the last 72 hours. Thyroid Function Tests: Recent Labs    07/30/23 0546  TSH 1.480  FREET4 1.32*   Anemia Panel: No results for input(s): "VITAMINB12", "FOLATE", "FERRITIN", "TIBC", "IRON", "RETICCTPCT" in the last 72 hours. Sepsis Labs: Recent Labs  Lab 07/29/23 0744 07/29/23 1239  LATICACIDVEN 2.0* 1.2    Recent Results (from the past 240 hours)  SARS Coronavirus 2 by RT PCR (hospital order, performed in [facility] hospital lab) *cepheid single result test* Urine, Clean Catch     Status: None   Collection Time: 07/29/23  7:43 AM   Specimen: Urine, Clean Catch; Nasal Swab  Result  Value Ref Range Status   SARS Coronavirus 2 by RT PCR NEGATIVE NEGATIVE Final    Comment: (NOTE) SARS-CoV-2 target nucleic acids are NOT DETECTED.  The SARS-CoV-2 RNA is generally detectable in upper and lower respiratory specimens during the acute phase of infection. The lowest concentration of SARS-CoV-2 viral copies this assay can detect is 250 copies / mL. A negative result does not preclude SARS-CoV-2 infection and should not be used as the sole basis for treatment or other patient management decisions.  A negative result may occur with improper specimen collection / handling, submission of specimen other than nasopharyngeal swab, presence of viral mutation(s) within the areas targeted by this  assay, and inadequate number of viral copies (<250 copies / mL). A negative result must be combined with clinical observations, patient history, and epidemiological information.  Fact Sheet for Patients:   RoadLapTop.co.za  Fact Sheet for Healthcare Providers: http://kim-miller.com/  This test is not yet approved or  cleared by the [place] FDA and has been authorized for detection and/or diagnosis of SARS-CoV-2 by FDA under an Emergency Use Authorization (EUA).  This EUA will remain in effect (meaning this test can be used) for the duration of the COVID-19 declaration under Section 564(b)(1) of the Act, 21 U.S.C. section 360bbb-3(b)(1), unless the authorization is terminated or revoked sooner.  Performed at [facility], [address], [place], [place] [address]   Urine Culture     Status: Abnormal   Collection Time: 07/29/23  7:43 AM   Specimen: Urine, Random  Result Value Ref Range Status   Specimen Description   Final    URINE, RANDOM Performed at [facility], [address] [place], [place] [address]    Special Requests   Final    NONE Reflexed from [id number] Performed at [facility], [address] [place], [place] [address]    Culture (A)  Final    50,000 COLONIES/mL AEROCOCCUS URINAE Standardized susceptibility testing for this organism is not available. Performed at [facility], [address] [place], [place] [address]    Report Status 07/31/2023 FINAL  Final  Blood culture (routine x 2)     Status: None (Preliminary result)   Collection Time: 07/29/23 12:39 PM   Specimen: BLOOD  Result Value Ref Range Status   Specimen Description BLOOD BLOOD RIGHT FOREARM  Final   Special Requests   Final    BOTTLES DRAWN AEROBIC AND ANAEROBIC Blood Culture results may not be optimal due to an inadequate volume of blood received in culture bottles   Culture   Final    NO GROWTH 2  DAYS Performed at [facility], [address], [place], [place] [address]    Report Status PENDING  Incomplete  Blood culture (routine x 2)     Status: None (Preliminary result)   Collection Time: 07/29/23 12:39 PM   Specimen: BLOOD LEFT HAND  Result Value Ref Range Status   Specimen Description BLOOD LEFT HAND  Final   Special Requests   Final    BOTTLES DRAWN AEROBIC AND ANAEROBIC Blood Culture results may not be optimal due to an inadequate volume of blood received in culture bottles   Culture   Final    NO GROWTH 2 DAYS Performed at [facility], [address] [place], [place] [address]    Report Status PENDING  Incomplete         Radiology Studies: No results found.  Scheduled Meds:  amiodarone  200 mg Oral QPM   atorvastatin  10 mg Oral q1800   buPROPion  75 mg Oral BID   feeding supplement  237 mL Oral BID BM   levothyroxine  100 mcg Oral Q0600   metoprolol succinate  12.5 mg Oral QPM   midodrine  10 mg Oral TID with meals   tamsulosin  0.4 mg Oral QPC supper   warfarin  1.25 mg Oral ONCE-1600   Warfarin - Pharmacist Dosing Inpatient   Does not apply q1600   Continuous Infusions:  cefTRIAXone (ROCEPHIN)  IV 1 g (07/31/23 0935)     LOS: 2 days    Time spent: 50 minutes spent on chart review, discussion with nursing staff, consultants, updating family and interview/physical exam; more than 50% of that time was spent in counseling and/or coordination of care.    Alvira Philips [place], DO [facility] Available via Epic secure chat 7am-7pm After these hours, please refer to coverage provider listed on [email or web address] 07/31/2023, 12:34 PM

## 2023-08-01 DIAGNOSIS — A419 Sepsis, unspecified organism: Secondary | ICD-10-CM | POA: Diagnosis not present

## 2023-08-01 LAB — BASIC METABOLIC PANEL
Anion gap: 6 (ref 5–15)
BUN: 17 mg/dL (ref 8–23)
CO2: 24 mmol/L (ref 22–32)
Calcium: 8.3 mg/dL — ABNORMAL LOW (ref 8.9–10.3)
Chloride: 106 mmol/L (ref 98–111)
Creatinine, Ser: 0.95 mg/dL (ref 0.61–1.24)
GFR, Estimated: >60 mL/min (ref >60)
Glucose, Bld: 75 mg/dL (ref 70–99)
Potassium: 4.2 mmol/L (ref 3.5–5.1)
Sodium: 136 mmol/L (ref 135–145)

## 2023-08-01 LAB — PROTIME-INR
INR: 2.5 — ABNORMAL HIGH (ref 0.8–1.2)
Prothrombin Time: 27.5 s — ABNORMAL HIGH (ref 11.4–15.2)

## 2023-08-01 MED ORDER — WARFARIN SODIUM 2.5 MG PO TABS
2.5000 mg | ORAL_TABLET | Freq: Once | ORAL | Status: DC
  Filled 2023-08-01: qty 1

## 2023-08-01 MED ORDER — TAMSULOSIN HCL 0.4 MG PO CAPS: 0.4000 mg | ORAL_CAPSULE | Freq: Every day | ORAL | 0 refills | Status: DC

## 2023-08-01 MED ORDER — AMOXICILLIN 500 MG PO CAPS: 500.0000 mg | ORAL_CAPSULE | Freq: Three times a day (TID) | ORAL | 0 refills | Status: AC

## 2023-08-01 MED ORDER — AMOXICILLIN 500 MG PO CAPS: 500.0000 mg | ORAL_CAPSULE | Freq: Three times a day (TID) | ORAL | Status: DC

## 2023-08-01 NOTE — TOC Transition Note (Signed)
Transition of Care [facility]) - Discharge Note   Patient Details  Name: Jack Harris MRN: [id number] Date of Birth: July 04, 1934  Transition of Care  [facility]) CM/SW Contact:  Garret Reddish, RN Phone Number: 08/01/2023, 12:31 PM   Clinical Narrative:    Chart reviewed.  Noted that patient has orders for discharge today.  I have spoken to patient about the recommendation for Home Health PT.  Patient reports that he will be going to [place] with his daughter for 3-4 weeks.  He has declined PT at this time.  I have informed him that if he feels like he needs PT when he returns to follow up with PCP.  Patient informs me that he has a rolling walker at home but he does not use the rolling walker.    No other discharge needs identified.     Final next level of care:  (Patient has declined home health) Barriers to Discharge: No Barriers Identified   Patient Goals and CMS Choice   CMS Medicare.gov Compare Post Acute Care list provided to:: Patient Choice offered to / list presented to : Patient      Discharge Placement                    Patient and family notified of of transfer: 08/01/23  Discharge Plan and Services Additional resources added to the After Visit Summary for                  DME Arranged:  (Patient has a rolling walker at home but reports that he does not use the rolling walker.)                    Social Drivers of Health (SDOH) Interventions SDOH Screenings   Food Insecurity: No Food Insecurity (07/30/2023)  Housing: Unknown (07/30/2023)  Transportation Needs: No Transportation Needs (07/30/2023)  Utilities: Not At Risk (07/30/2023)  Depression (PHQ2-9): Low Risk  (01/26/2023)  Financial Resource Strain: Low Risk  (01/26/2023)  Stress: No Stress Concern Present (01/26/2023)  Recent Concern: Stress - Stress Concern Present (01/26/2023)  Tobacco Use: Medium Risk (05/02/2023)   Received from [facility]     Readmission Risk  Interventions     No data to display

## 2023-08-01 NOTE — Consult Note (Signed)
PHARMACY - ANTICOAGULATION CONSULT NOTE  Pharmacy Consult for Warfarin Indication: atrial fibrillation  Allergies  Allergen Reactions   Ciprofloxacin Hives   Levaquin [Levofloxacin In D5w] Hives   Patient Measurements: Weight: 57.2 kg (126 lb 1.7 oz)  Vital Signs: Temp: 97.7 F (36.5 C) (12/18 0848) BP: 97/55 (12/18 0848) Pulse Rate: 99 (12/18 0848)  Labs: Recent Labs    07/30/23 0546 07/31/23 0403 08/01/23 0240  HGB 11.9* 11.9*  --   HCT 36.3* 36.2*  --   PLT 124* 160  --   LABPROT 31.8* 29.3* 27.5*  INR 3.1* 2.7* 2.5*  CREATININE  --  1.14 0.95   Estimated Creatinine Clearance: 44.3 mL/min (by C-G formula based on SCr of 0.95 mg/dL).  Medications:  Home regimen was confirmed with patient as alternating between 1.25 mg and 2.5 mg daily - last dose was 2.5 mg taken 12/14 PM   DDI:  PTA meds - amiodarone   Assessment: Jack Harris is an 89 year old male that presented with generalized weakness. Reports that he work up this morning and fell getting out of bed. PMH is significant for atrial fibrillation for which he takes warfarin. Patient took his last dose on 12/14 PM prior to admission. Pharmacy has been consulted for management of warfarin.   Goal of Therapy:  INR 2-3 Monitor platelets by anticoagulation protocol: Yes  Per RN, no signs/symptoms of bleeding noted. Of note, patient resumed home amiodarone 200 mg daily - first dose on 12/15 at 1730. Given that patient's home warfarin regimen was resumed on 12/15. INR    Date INR Warfarin dose Comments  12/15 2.6 1.25 mg Baseline DDI with amiodarone, UTI?, syncope  12/16 3.1 1.25 mg CBC stable  12/17 2.7 1.25 mg   12/18 2.5  Resume alternating 1.25mg  and 2.5mg  dose    Plan:  Warfarin 2.5 mg x 1 this evening Monitor INR daily while impatient Monitor CBC and signs/symptoms of bleeding  Thank you for involving pharmacy in this patient's care.   Kresha Abelson Rodriguez-Guzman PharmD, BCPS 08/01/2023 9:24 AM

## 2023-08-01 NOTE — Plan of Care (Signed)

## 2023-08-01 NOTE — Progress Notes (Signed)
Physical Therapy Treatment Patient Details Name: Jack Harris MRN: [id number] DOB: 09/01/1933 Today's Date: 08/01/2023   History of Present Illness Jack Harris is a 89yoM who comes to [facility] on 07/29/23 with weakness, dysuria, falls at home. Emesis, fever, orthostatic dizziness. PMH: PAF on eliquis, HFrEF, AICD, pulmonary hypertension. Pt admitted with UTI sepsis.    PT Comments  Pt was long sitting in bed upon arrival. He is A and O x 4 and extremely pleasant. " I want to go home." Pt was able to demonstrate safe abilities to exit bed, stand, and ambulate without AD. He also demonstrates safe abilities to ascend/descend stairs. No symptoms of low BP however lowest BP reading during session 96/49 (59). Overall pt tolerates session well. Recommend HHPT at DC to maximize his safety during ADLs while improving higher level balance deficits. Acute PT will continue to follow per current POC.     If plan is discharge home, recommend the following: A little help with walking and/or transfers;Assist for transportation;Assistance with cooking/housework;Help with stairs or ramp for entrance     Equipment Recommendations  None recommended by PT       Precautions / Restrictions Precautions Precautions: Fall Restrictions Weight Bearing Restrictions Per Provider Order: No     Mobility  Bed Mobility Overal bed mobility: Modified Independent     Transfers Overall transfer level: Modified independent Equipment used: Rolling walker (2 wheels), None Transfers: Sit to/from Stand Sit to Stand: Modified independent (Device/Increase time)   Ambulation/Gait Ambulation/Gait assistance: Modified independent (Device/Increase time) Gait Distance (Feet): 400 Feet Assistive device: None Gait Pattern/deviations: Step-through pattern Gait velocity: WNL  General Gait Details: pt was able to ambulate ~ 400 ft without LOB or safety concern. He does own a SPC and RW. encouraged to use SPC at DC for additional  safety   Stairs Stairs: Yes Stairs assistance: Supervision Stair Management: Two rails, Forwards, Alternating pattern Number of Stairs: 4 General stair comments: Pt was easily able to perform ascending/descending stairs. pt has ramp entry to home if needed         Cognition Arousal: Alert Behavior During Therapy: [facility] for tasks assessed/performed Overall Cognitive Status: Within Functional Limits for tasks assessed    General Comments: extremely cooperative and pleasant. A and o x 4               Pertinent Vitals/Pain Pain Assessment Pain Assessment: No/denies pain     PT Goals (current goals can now be found in the care plan section) Acute Rehab PT Goals Patient Stated Goal: go home today Progress towards PT goals: Progressing toward goals    Frequency    Min 1X/week       AM-PAC PT "6 Clicks" Mobility   Outcome Measure  Help needed turning from your back to your side while in a flat bed without using bedrails?: None Help needed moving from lying on your back to sitting on the side of a flat bed without using bedrails?: None Help needed moving to and from a bed to a chair (including a wheelchair)?: None Help needed standing up from a chair using your arms (e.g., wheelchair or bedside chair)?: A Little Help needed to walk in hospital room?: A Little Help needed climbing 3-5 steps with a railing? : A Little 6 Click Score: 21    End of Session   Activity Tolerance: Patient tolerated treatment well Patient left: in chair;with call bell/phone within reach Nurse Communication: Mobility status PT Visit Diagnosis: Unsteadiness on feet (R26.81);Other abnormalities  of gait and mobility (R26.89);History of falling (Z91.81)     Time: 0383-3383 PT Time Calculation (min) (ACUTE ONLY): 18 min  Charges:    $Gait Training: 8-22 mins PT General Charges $$ ACUTE PT VISIT: 1 Visit                    Jetta Lout PTA 08/01/23, 11:06 AM

## 2023-08-01 NOTE — Discharge Summary (Signed)
Physician Discharge Summary  Jack Harris ZOX:[id number] DOB: 27-Aug-1933 DOA: 07/29/2023  PCP: Gracelyn Nurse, MD  Admit date: 07/29/2023 Discharge date: 08/01/2023  Admitted From: Home Disposition: Home with home health  Recommendations for Outpatient Follow-up:  Follow up with PCP in 1-2 weeks   Home Health: Yes PT Equipment/Devices: None  Discharge Condition: Stable CODE STATUS: Full Diet recommendation: Regular  Brief/Interim Summary:   Discharge Diagnoses:  Principal Problem:   Sepsis (HCC) Active Problems:   Atrial fibrillation (HCC)   Sepsis secondary to UTI (HCC) Sepsis, POA Urinary tract infection Patient presenting with weakness, nausea/vomiting.  Temperature 101.8 F, HR 101.  Lactic acid 2.0.  Urinalysis with small leukocytes, negative nitrite, 21-50 WBCs.  De-escalate to oral antibiotics.  Complete additional 3 days for total 7-day antibiotic course  Orthostatic hypotension Resolved at time of discharge.  Work with physical therapy on day of discharge  Paroxysmal atrial fibrillation Chronic systolic congestive heart failure, compensated Cardiomyopathy s/p AICD -- Amiodarone 200 mg p.o. nightly -- Metoprolol succinate 12.5 mg p.o. nightly -- Continue warfarin,      Discharge Instructions  Discharge Instructions     Diet - low sodium heart healthy   Complete by: As directed    Increase activity slowly   Complete by: As directed       Allergies as of 08/01/2023       Reactions   Ciprofloxacin Hives   Levaquin [levofloxacin In D5w] Hives        Medication List     TAKE these medications    amiodarone 200 MG tablet Commonly known as: PACERONE Take 200 mg by mouth every evening.   amoxicillin 500 MG capsule Commonly known as: AMOXIL Take 1 capsule (500 mg total) by mouth every 8 (eight) hours for 3 days. Start taking on: August 02, 2023   atorvastatin 10 MG tablet Commonly known as: LIPITOR Take 10 mg by mouth at  bedtime.   buPROPion 75 MG tablet Commonly known as: WELLBUTRIN Take 75 mg by mouth 2 (two) times daily.   cyanocobalamin 1000 MCG tablet Commonly known as: VITAMIN B12 Take 1,000 mcg by mouth daily.   levothyroxine 100 MCG tablet Commonly known as: SYNTHROID Take 100 mcg by mouth daily before breakfast.   metoprolol succinate 25 MG 24 hr tablet Commonly known as: TOPROL-XL Take 12.5 mg by mouth every evening. Take with or immediately following a meal.   midodrine 10 MG tablet Commonly known as: PROAMATINE Take 10 mg by mouth 3 (three) times daily.   tamsulosin 0.4 MG Caps capsule Commonly known as: FLOMAX Take 1 capsule (0.4 mg total) by mouth daily after supper.   warfarin 2.5 MG tablet Commonly known as: COUMADIN Take 2.5 mg by mouth every evening.        Allergies  Allergen Reactions   Ciprofloxacin Hives   Levaquin [Levofloxacin In D5w] Hives    Consultations: None   Procedures/Studies: CT Head Wo Contrast Result Date: 07/29/2023 CLINICAL DATA:  Facial trauma, blunt. Patient fell getting out of bed. No loss of consciousness. Patient did not strike his head. The patient takes warfarin. EXAM: CT HEAD WITHOUT CONTRAST TECHNIQUE: Contiguous axial images were obtained from the base of the skull through the vertex without intravenous contrast. RADIATION DOSE REDUCTION: This exam was performed according to the departmental dose-optimization program which includes automated exposure control, adjustment of the mA and/or kV according to patient size and/or use of iterative reconstruction technique. COMPARISON:  CT head without contrast 12/03/2015 FINDINGS: Brain:  No acute infarct, hemorrhage, or mass lesion is present. Mild atrophy and white matter changes are stable, likely within normal limits for age. The ventricles are proportionate to the degree of atrophy. Deep brain nuclei are within normal limits. No significant extraaxial fluid collection is present. The brainstem  and cerebellum are within normal limits. Midline structures are within normal limits. Vascular: Atherosclerotic calcifications are present within the cavernous internal carotid arteries and at the dural margin of both vertebral arteries. No hyperdense vessels are present. Skull: Calvarium is intact. No focal lytic or blastic lesions are present. No significant extracranial soft tissue lesion is present. Sinuses/Orbits: The paranasal sinuses and mastoid air cells are clear. Bilateral lens replacements are noted. Globes and orbits are otherwise unremarkable. IMPRESSION: 1. No acute intracranial abnormality or significant interval change. 2. Stable atrophy and white matter disease, likely within normal limits for age. Electronically Signed   By: Marin Roberts M.D.   On: 07/29/2023 12:26   DG Chest 2 View Result Date: 07/29/2023 CLINICAL DATA:  89 year old male with history of fever. EXAM: CHEST - 2 VIEW COMPARISON:  Chest x-ray 07/05/2022. FINDINGS: Lung volumes are normal. No consolidative airspace disease. No definite pleural effusions. No pneumothorax. No evidence of pulmonary edema. Heart size is mildly enlarged. Upper mediastinal contours are within normal limits. Atherosclerotic calcifications in the thoracic aorta. Left-sided pacemaker/AICD device in place with lead tips projecting over the expected location of the right atrium and right ventricle. IMPRESSION: 1. No radiographic evidence of acute cardiopulmonary disease. 2. Cardiomegaly. 3. Aortic atherosclerosis. Electronically Signed   By: Trudie Reed M.D.   On: 07/29/2023 08:16      Subjective: Seen and examined on the day of discharge.  Stable no distress.  Appropriate discharge home.  Discharge Exam: Vitals:   08/01/23 0010 08/01/23 0848  BP: 101/62 (!) 97/55  Pulse: 81 99  Resp: 16 18  Temp: 98.1 F (36.7 C) 97.7 F (36.5 C)  SpO2: 99% 98%   Vitals:   07/31/23 1547 07/31/23 1722 08/01/23 0010 08/01/23 0848  BP: 97/65  95/63 101/62 (!) 97/55  Pulse: 81 85 81 99  Resp: 15  16 18   Temp: 98 F (36.7 C)  98.1 F (36.7 C) 97.7 F (36.5 C)  TempSrc: Oral     SpO2: 99% 99% 99% 98%  Weight:        General: Pt is alert, awake, not in acute distress Cardiovascular: RRR, S1/S2 +, no rubs, no gallops Respiratory: CTA bilaterally, no wheezing, no rhonchi Abdominal: Soft, NT, ND, bowel sounds + Extremities: no edema, no cyanosis    The results of significant diagnostics from this hospitalization (including imaging, microbiology, ancillary and laboratory) are listed below for reference.     Microbiology: Recent Results (from the past 240 hours)  SARS Coronavirus 2 by RT PCR (hospital order, performed in [facility] hospital lab) *cepheid single result test* Urine, Clean Catch     Status: None   Collection Time: 07/29/23  7:43 AM   Specimen: Urine, Clean Catch; Nasal Swab  Result Value Ref Range Status   SARS Coronavirus 2 by RT PCR NEGATIVE NEGATIVE Final    Comment: (NOTE) SARS-CoV-2 target nucleic acids are NOT DETECTED.  The SARS-CoV-2 RNA is generally detectable in upper and lower respiratory specimens during the acute phase of infection. The lowest concentration of SARS-CoV-2 viral copies this assay can detect is 250 copies / mL. A negative result does not preclude SARS-CoV-2 infection and should not be used as the sole  basis for treatment or other patient management decisions.  A negative result may occur with improper specimen collection / handling, submission of specimen other than nasopharyngeal swab, presence of viral mutation(s) within the areas targeted by this assay, and inadequate number of viral copies (<250 copies / mL). A negative result must be combined with clinical observations, patient history, and epidemiological information.  Fact Sheet for Patients:   RoadLapTop.co.za  Fact Sheet for Healthcare  Providers: http://kim-miller.com/  This test is not yet approved or  cleared by the [place] FDA and has been authorized for detection and/or diagnosis of SARS-CoV-2 by FDA under an Emergency Use Authorization (EUA).  This EUA will remain in effect (meaning this test can be used) for the duration of the COVID-19 declaration under Section 564(b)(1) of the Act, 21 U.S.C. section 360bbb-3(b)(1), unless the authorization is terminated or revoked sooner.  Performed at [facility], [address], [place], [place] [address]   Urine Culture     Status: Abnormal   Collection Time: 07/29/23  7:43 AM   Specimen: Urine, Random  Result Value Ref Range Status   Specimen Description   Final    URINE, RANDOM Performed at [facility], [address] [place], [place] [address]    Special Requests   Final    NONE Reflexed from [phone] [id number] Performed at [facility], [address] [place], [place] [address]    Culture (A)  Final    50,000 COLONIES/mL AEROCOCCUS URINAE Standardized susceptibility testing for this organism is not available. Performed at [facility], [address] [place], [place] [address]    Report Status 07/31/2023 FINAL  Final  Blood culture (routine x 2)     Status: None (Preliminary result)   Collection Time: 07/29/23 12:39 PM   Specimen: BLOOD  Result Value Ref Range Status   Specimen Description BLOOD BLOOD RIGHT FOREARM  Final   Special Requests   Final    BOTTLES DRAWN AEROBIC AND ANAEROBIC Blood Culture results may not be optimal due to an inadequate volume of blood received in culture bottles   Culture   Final    NO GROWTH 3 DAYS Performed at [facility], [address], [place], [place] [address]    Report Status PENDING  Incomplete  Blood culture (routine x 2)     Status: None (Preliminary result)   Collection Time: 07/29/23 12:39 PM   Specimen: BLOOD LEFT HAND  Result Value Ref Range  Status   Specimen Description BLOOD LEFT HAND  Final   Special Requests   Final    BOTTLES DRAWN AEROBIC AND ANAEROBIC Blood Culture results may not be optimal due to an inadequate volume of blood received in culture bottles   Culture   Final    NO GROWTH 3 DAYS Performed at [facility], [address], [place], [place] [address]    Report Status PENDING  Incomplete     Labs: BNP (last 3 results) No results for input(s): "BNP" in the last 8760 hours. Basic Metabolic Panel: Recent Labs  Lab 07/29/23 0744 07/31/23 0403 08/01/23 0240  NA 137 137 136  K 4.4 4.0 4.2  CL 103 104 106  CO2 25 25 24   GLUCOSE 147* 92 75  BUN 26* 19 17  CREATININE 1.20 1.14 0.95  CALCIUM 8.8* 8.3* 8.3*  MG  --  2.0  --   PHOS  --  2.6  --    Liver Function Tests: Recent Labs  Lab 07/29/23 0744  AST 26  ALT 20  ALKPHOS 59  BILITOT 0.5  PROT 6.6  ALBUMIN 3.8   No results for input(s): "LIPASE", "AMYLASE" in the last 168 hours. No results for input(s): "AMMONIA" in the last 168 hours. CBC: Recent Labs  Lab 07/29/23 0744 07/30/23 0546 07/31/23 0403  WBC 7.8 5.8 5.4  NEUTROABS 6.7  --   --   HGB 13.0 11.9* 11.9*  HCT 39.1 36.3* 36.2*  MCV 93.3 96.5 93.3  PLT 184 124* 160   Cardiac Enzymes: No results for input(s): "CKTOTAL", "CKMB", "CKMBINDEX", "TROPONINI" in the last 168 hours. BNP: Invalid input(s): "POCBNP" CBG: No results for input(s): "GLUCAP" in the last 168 hours. D-Dimer No results for input(s): "DDIMER" in the last 72 hours. Hgb A1c No results for input(s): "HGBA1C" in the last 72 hours. Lipid Profile No results for input(s): "CHOL", "HDL", "LDLCALC", "TRIG", "CHOLHDL", "LDLDIRECT" in the last 72 hours. Thyroid function studies Recent Labs    07/30/23 0546  TSH 1.480   Anemia work up No results for input(s): "VITAMINB12", "FOLATE", "FERRITIN", "TIBC", "IRON", "RETICCTPCT" in the last 72 hours. Urinalysis    Component Value Date/Time   COLORURINE  YELLOW (A) 07/29/2023 0743   APPEARANCEUR HAZY (A) 07/29/2023 0743   APPEARANCEUR Clear 03/14/2023 1010   LABSPEC 1.019 07/29/2023 0743   LABSPEC 1.021 01/29/2012 1510   PHURINE 5.0 07/29/2023 0743   GLUCOSEU NEGATIVE 07/29/2023 0743   GLUCOSEU Negative 01/29/2012 1510   HGBUR SMALL (A) 07/29/2023 0743   BILIRUBINUR NEGATIVE 07/29/2023 0743   BILIRUBINUR Negative 03/14/2023 1010   BILIRUBINUR Negative 01/29/2012 1510   KETONESUR NEGATIVE 07/29/2023 0743   PROTEINUR NEGATIVE 07/29/2023 0743   NITRITE NEGATIVE 07/29/2023 0743   LEUKOCYTESUR SMALL (A) 07/29/2023 0743   LEUKOCYTESUR Negative 01/29/2012 1510   Sepsis Labs Recent Labs  Lab 07/29/23 0744 07/30/23 0546 07/31/23 0403  WBC 7.8 5.8 5.4   Microbiology Recent Results (from the past 240 hours)  SARS Coronavirus 2 by RT PCR (hospital order, performed in [facility] hospital lab) *cepheid single result test* Urine, Clean Catch     Status: None   Collection Time: 07/29/23  7:43 AM   Specimen: Urine, Clean Catch; Nasal Swab  Result Value Ref Range Status   SARS Coronavirus 2 by RT PCR NEGATIVE NEGATIVE Final    Comment: (NOTE) SARS-CoV-2 target nucleic acids are NOT DETECTED.  The SARS-CoV-2 RNA is generally detectable in upper and lower respiratory specimens during the acute phase of infection. The lowest concentration of SARS-CoV-2 viral copies this assay can detect is 250 copies / mL. A negative result does not preclude SARS-CoV-2 infection and should not be used as the sole basis for treatment or other patient management decisions.  A negative result may occur with improper specimen collection / handling, submission of specimen other than nasopharyngeal swab, presence of viral mutation(s) within the areas targeted by this assay, and inadequate number of viral copies (<250 copies / mL). A negative result must be combined with clinical observations, patient history, and epidemiological information.  Fact Sheet for  Patients:   RoadLapTop.co.za  Fact Sheet for Healthcare Providers: http://kim-miller.com/  This test is not yet approved or  cleared by the [place] FDA and has been authorized for detection and/or diagnosis of SARS-CoV-2 by FDA under an Emergency Use Authorization (EUA).  This EUA will remain in effect (meaning this test can be used) for the duration of the COVID-19 declaration under Section 564(b)(1) of the Act, 21 U.S.C.  section 360bbb-3(b)(1), unless the authorization is terminated or revoked sooner.  Performed at [facility], [address] [place], [place] [address]   Urine Culture     Status: Abnormal   Collection Time: 07/29/23  7:43 AM   Specimen: Urine, Random  Result Value Ref Range Status   Specimen Description   Final    URINE, RANDOM Performed at [facility], [address], [place], [place] [address]    Special Requests   Final    NONE Reflexed from [id number] Performed at [facility], [address] [place], [place] [address]    Culture (A)  Final    50,000 COLONIES/mL AEROCOCCUS URINAE Standardized susceptibility testing for this organism is not available. Performed at [facility], [address] [place], [place] [address]    Report Status 07/31/2023 FINAL  Final  Blood culture (routine x 2)     Status: None (Preliminary result)   Collection Time: 07/29/23 12:39 PM   Specimen: BLOOD  Result Value Ref Range Status   Specimen Description BLOOD BLOOD RIGHT FOREARM  Final   Special Requests   Final    BOTTLES DRAWN AEROBIC AND ANAEROBIC Blood Culture results may not be optimal due to an inadequate volume of blood received in culture bottles   Culture   Final    NO GROWTH 3 DAYS Performed at [facility], [address], [place], [place] [address]    Report Status PENDING  Incomplete  Blood culture (routine x 2)     Status: None (Preliminary result)   Collection  Time: 07/29/23 12:39 PM   Specimen: BLOOD LEFT HAND  Result Value Ref Range Status   Specimen Description BLOOD LEFT HAND  Final   Special Requests   Final    BOTTLES DRAWN AEROBIC AND ANAEROBIC Blood Culture results may not be optimal due to an inadequate volume of blood received in culture bottles   Culture   Final    NO GROWTH 3 DAYS Performed at [facility], [address] [place], [place] [address]    Report Status PENDING  Incomplete     Time coordinating discharge: Over 30 minutes  SIGNED:   Tresa Moore, MD  [facility] 08/01/2023, 12:47 PM Pager   If 7PM-7AM, please contact night-coverage

## 2023-08-03 LAB — CULTURE, BLOOD (ROUTINE X 2)
Culture: NO GROWTH
Culture: NO GROWTH

## 2023-08-22 ENCOUNTER — Ambulatory Visit: Payer: Medicare Other | Admitting: Oncology

## 2023-08-22 ENCOUNTER — Other Ambulatory Visit: Payer: Medicare Other

## 2023-08-23 ENCOUNTER — Inpatient Hospital Stay (HOSPITAL_BASED_OUTPATIENT_CLINIC_OR_DEPARTMENT_OTHER): Payer: Medicare Other | Admitting: Oncology

## 2023-08-23 ENCOUNTER — Encounter: Payer: Self-pay | Admitting: Oncology

## 2023-08-23 ENCOUNTER — Inpatient Hospital Stay: Payer: Medicare Other | Attending: Oncology

## 2023-08-23 VITALS — BP 117/74 | HR 78 | Temp 97.5°F | Resp 18 | Wt 137.2 lb

## 2023-08-23 DIAGNOSIS — I700 Atherosclerosis of aorta: Secondary | ICD-10-CM | POA: Insufficient documentation

## 2023-08-23 DIAGNOSIS — Z87440 Personal history of urinary (tract) infections: Secondary | ICD-10-CM | POA: Diagnosis not present

## 2023-08-23 DIAGNOSIS — J9610 Chronic respiratory failure, unspecified whether with hypoxia or hypercapnia: Secondary | ICD-10-CM | POA: Diagnosis not present

## 2023-08-23 DIAGNOSIS — Z8551 Personal history of malignant neoplasm of bladder: Secondary | ICD-10-CM | POA: Diagnosis not present

## 2023-08-23 DIAGNOSIS — R634 Abnormal weight loss: Secondary | ICD-10-CM | POA: Diagnosis not present

## 2023-08-23 DIAGNOSIS — R7989 Other specified abnormal findings of blood chemistry: Secondary | ICD-10-CM | POA: Diagnosis not present

## 2023-08-23 DIAGNOSIS — G4733 Obstructive sleep apnea (adult) (pediatric): Secondary | ICD-10-CM | POA: Insufficient documentation

## 2023-08-23 LAB — CMP (CANCER CENTER ONLY)
ALT: 19 U/L (ref 0–44)
AST: 24 U/L (ref 15–41)
Albumin: 4.3 g/dL (ref 3.5–5.0)
Alkaline Phosphatase: 66 U/L (ref 38–126)
Anion gap: 12 (ref 5–15)
BUN: 26 mg/dL — ABNORMAL HIGH (ref 8–23)
CO2: 24 mmol/L (ref 22–32)
Calcium: 9.4 mg/dL (ref 8.9–10.3)
Chloride: 103 mmol/L (ref 98–111)
Creatinine: 1.41 mg/dL — ABNORMAL HIGH (ref 0.61–1.24)
GFR, Estimated: 48 mL/min — ABNORMAL LOW (ref >60)
Glucose, Bld: 120 mg/dL — ABNORMAL HIGH (ref 70–99)
Potassium: 4.8 mmol/L (ref 3.5–5.1)
Sodium: 139 mmol/L (ref 135–145)
Total Bilirubin: 0.9 mg/dL (ref 0.0–1.2)
Total Protein: 7.5 g/dL (ref 6.5–8.1)

## 2023-08-23 LAB — CBC WITH DIFFERENTIAL (CANCER CENTER ONLY)
Abs Immature Granulocytes: 0.01 10*3/uL (ref 0.00–0.07)
Basophils Absolute: 0 10*3/uL (ref 0.0–0.1)
Basophils Relative: 1 %
Eosinophils Absolute: 0.1 10*3/uL (ref 0.0–0.5)
Eosinophils Relative: 2 %
HCT: 40.6 % (ref 39.0–52.0)
Hemoglobin: 13 g/dL (ref 13.0–17.0)
Immature Granulocytes: 0 %
Lymphocytes Relative: 17 %
Lymphs Abs: 0.9 10*3/uL (ref 0.7–4.0)
MCH: 29.8 pg (ref 26.0–34.0)
MCHC: 32 g/dL (ref 30.0–36.0)
MCV: 93.1 fL (ref 80.0–100.0)
Monocytes Absolute: 0.6 10*3/uL (ref 0.1–1.0)
Monocytes Relative: 13 %
Neutro Abs: 3.4 10*3/uL (ref 1.7–7.7)
Neutrophils Relative %: 67 %
Platelet Count: 217 10*3/uL (ref 150–400)
RBC: 4.36 MIL/uL (ref 4.22–5.81)
RDW: 13.5 % (ref 11.5–15.5)
WBC Count: 5 10*3/uL (ref 4.0–10.5)
nRBC: 0 % (ref 0.0–0.2)

## 2023-08-23 NOTE — Progress Notes (Signed)
 Hematology/Oncology Consult Note [phone] [phone]     REFERRING PROVIDER: Rudolpho Norleen BIRCH, MD    CHIEF COMPLAINTS/PURPOSE OF CONSULTATION:  Unintentional weight loss.   ASSESSMENT & PLAN:   Weight loss Feb 2024 CT showed no suspicion of cancer. Physical examination did not reveal any organomegaly or lymphadenopathy He has chronic respiratory failure, and pulmonary cachexia may contribute to his weight loss.  He also does not eat well which may lead to weight loss. Refer to nutritionist.  No M protein on SPEP, negative flowcytometry, normal B12, normal Folate, negative hepatitis panel, negative. HIV LDH is slightly elevated, this could be explained by increased self-destruction caused by his valve disease I will hold off additional workup at this point recommend observation.  He has gained weight.   History of bladder cancer He was seen by urologist Dr. Kennith in July 2024. Cystoscopy was negative. He follows up PRN   Elevated serum creatinine Encourage oral hydration and avoid nephrotoxins.  Recommend him to repeat kidney function test when he sees PCP this month.    Patient is discharged from my clinic. I recommend patient to continue follow up with primary care physician. Patient may re-establish care in the future if clinically indicated.  All questions were answered. The patient knows to call the clinic with any problems, questions or concerns.  Zelphia Cap, MD, PhD [facility] Hematology Oncology 08/23/2023    HISTORY OF PRESENTING ILLNESS:  Jack Harris 89 y.o. male presents to establish care for unintentional weight loss.   Patient was accompanied by care giver. He has noticed weight loss, per patient and care giver, he dropped from 135 pounds to 125 pounds in the past 2-3 months.  I reviewed recorded weight documented during his previous medical visits.  09/15/2022 132Ib 07/21/2022 135 Ib 07/14/2023 134 Ib  He does not eat much,  care give says  he eats to live, if he is not hungry, he does not eat.  He has normal regular formed bowel movement, no blood in stool.  He is a casual cigar smoker, chronic hoarseness after a remote throat surgery.  + fatigue, unsteady gait. His pcp discontinued his statin 2 weeks ago.  Denies  fever, chills, fatigue, night sweats. Denies dysphagia, mouth sore.   + Chronic respiratory failure, OSA, on Oxygen PRN. - he sees. Dr. Aleskerov.   09/22/22 CT chest abdomen pelvis w contrast showed 1. No convincing evidence of malignancy within the chest, abdomen or pelvis. 2. Mild prominence of the biliary tree with the common duct measuring 9 mm, minimally greater than expected for patient's age. Large periampullary duodenal diverticulum which narrows the distal common duct near the ampulla. Constellation of findings are most compatible with senescent change and back pressure from the large periampullary duodenal diverticulum. Recommend correlation with LFTs and consider further evaluation with MRCP if clinical signs/symptoms of biliary obstruction 3. Colonic diverticulosis without findings of acute diverticulitis. 4. Moderate volume of formed stool in the colon. Correlate for constipation. 5. Symmetric wall thickening of the urinary bladder, which may be secondary to chronic outflow impedance or cystitis. 6. Unchanged aneurysmal dilation of the ascending thoracic aorta measuring 4.4 cm, Recommend annual imaging followup by CTA or MRA.This recommendation follows 2010 ACCF/AHA/AATS/ACR/ASA/SCA/SCAI/SIR/STS/SVM Guidelines for the Diagnosis and Management of Patients with Thoracic Aortic Disease. Circulation. 2010; 121: Z733-z630. Aortic aneurysm NOS (ICD10-I71.9) 7.  Aortic Atherosclerosis  INTERVAL HISTORY TOPHER BUENAVENTURA is a 89 y.o. male who has above history reviewed by me today presents for follow  up visit for unintended weight loss. Patient has had blood work done since last visit. He has  gained weight.  Recent admission due to UTI  MEDICAL HISTORY:  Past Medical History:  Diagnosis Date   AICD (automatic cardioverter/defibrillator) present    [employer]   Aortic insufficiency    Asthma    childhood asthma   Atrial fibrillation (HCC)    Cancer (HCC)    bladder, 10 yrs ago   Cancer (HCC)    squamous cell carcinoma   Cardiomyopathy (HCC)    CHF (congestive heart failure) (HCC)    Hyperlipidemia    Hypothyroidism    Paralysis of vocal cords and larynx, unilateral    Shortness of breath dyspnea     SURGICAL HISTORY: Past Surgical History:  Procedure Laterality Date   BLADDER SURGERY     Surgeon:Dr. Twylla, Location [facility]    CARDIAC DEFIBRILLATOR PLACEMENT     [facility]   EYE SURGERY Bilateral    Cataract Extraction with IOL   IMPLANTABLE CARDIOVERTER DEFIBRILLATOR (ICD) GENERATOR CHANGE Left 04/23/2019   Procedure: ICD GENERATOR CHANGE;  Surgeon: Ammon Blunt, MD;  Location: [facility] ORS;  Service: Cardiovascular;  Laterality: Left;   THYROPLASTY Left 03/02/2016   Procedure: THYROPLASTY;  Surgeon: Chinita Hasten, MD;  Location: [facility] ORS;  Service: ENT;  Laterality: Left;    SOCIAL HISTORY: Social History   Socioeconomic History   Marital status: Married    Spouse name: Not on file   Number of children: Not on file   Years of education: Not on file   Highest education level: Not on file  Occupational History   Not on file  Tobacco Use   Smoking status: Never   Smokeless tobacco: Former    Types: Chew  Substance and Sexual Activity   Alcohol use: Not Currently    Comment: occ   Drug use: No   Sexual activity: Not on file  Other Topics Concern   Not on file  Social History Narrative   Not on file   Social Drivers of Health   Financial Resource Strain: Low Risk  (01/26/2023)   Overall Financial Resource Strain (CARDIA)    Difficulty of Paying Living Expenses: Not very hard  Food Insecurity: No Food Insecurity (07/30/2023)    Hunger Vital Sign    Worried About Running Out of Food in the Last Year: Never true    Ran Out of Food in the Last Year: Never true  Transportation Needs: No Transportation Needs (07/30/2023)   PRAPARE - Administrator, Civil Service (Medical): No    Lack of Transportation (Non-Medical): No  Physical Activity: Not on file  Stress: No Stress Concern Present (01/26/2023)   [employer] of Occupational [employer] - Occupational Stress Questionnaire    Feeling of Stress : Only a little  Recent Concern: Stress - Stress Concern Present (01/26/2023)   [employer] of Occupational [employer] - Occupational Stress Questionnaire    Feeling of Stress : To some extent  Social Connections: Not on file  Intimate Partner Violence: Not At Risk (07/30/2023)   Humiliation, Afraid, Rape, and Kick questionnaire    Fear of Current or Ex-Partner: No    Emotionally Abused: No    Physically Abused: No    Sexually Abused: No    FAMILY HISTORY: Family History  Problem Relation Age of Onset   Diabetes Maternal Grandmother     ALLERGIES:  is allergic to ciprofloxacin and levaquin [levofloxacin in d5w].  MEDICATIONS:  Current  Outpatient Medications  Medication Sig Dispense Refill   amiodarone  (PACERONE ) 200 MG tablet Take 200 mg by mouth every evening.      atorvastatin  (LIPITOR) 10 MG tablet Take 10 mg by mouth at bedtime.     buPROPion  (WELLBUTRIN ) 75 MG tablet Take 75 mg by mouth 2 (two) times daily.     levothyroxine  (SYNTHROID ) 100 MCG tablet Take 100 mcg by mouth daily before breakfast.     metoprolol  succinate (TOPROL -XL) 25 MG 24 hr tablet Take 12.5 mg by mouth every evening. Take with or immediately following a meal.     midodrine  (PROAMATINE ) 10 MG tablet Take 10 mg by mouth 3 (three) times daily.     tamsulosin  (FLOMAX ) 0.4 MG CAPS capsule Take 1 capsule (0.4 mg total) by mouth daily after supper. 30 capsule 0   vitamin B-12 (CYANOCOBALAMIN ) 1000 MCG tablet Take 1,000 mcg by mouth  daily.     warfarin (COUMADIN ) 2.5 MG tablet Take 2.5 mg by mouth every evening.      No current facility-administered medications for this visit.    Review of Systems  Constitutional:  Negative for appetite change, chills, fatigue, fever and unexpected weight change.  HENT:   Negative for hearing loss and voice change.   Eyes:  Negative for eye problems and icterus.  Respiratory:  Positive for shortness of breath. Negative for chest tightness and cough.   Cardiovascular:  Negative for chest pain and leg swelling.  Gastrointestinal:  Negative for abdominal distention and abdominal pain.  Endocrine: Negative for hot flashes.  Genitourinary:  Negative for difficulty urinating, dysuria and frequency.   Musculoskeletal:  Positive for gait problem. Negative for arthralgias.  Skin:  Negative for itching and rash.  Neurological:  Positive for gait problem. Negative for light-headedness and numbness.  Hematological:  Negative for adenopathy. Does not bruise/bleed easily.  Psychiatric/Behavioral:  Negative for confusion.      PHYSICAL EXAMINATION: ECOG PERFORMANCE STATUS: 1 - Symptomatic but completely ambulatory  Vitals:   08/23/23 1033  BP: 117/74  Pulse: 78  Resp: 18  Temp: (!) 97.5 F (36.4 C)   Filed Weights   08/23/23 1033  Weight: 137 lb 3.2 oz (62.2 kg)    Physical Exam Constitutional:      General: He is not in acute distress.    Appearance: He is not diaphoretic.     Comments: Thin built  HENT:     Head: Normocephalic and atraumatic.  Eyes:     General: No scleral icterus. Cardiovascular:     Rate and Rhythm: Normal rate and regular rhythm.     Heart sounds: Murmur heard.  Pulmonary:     Effort: Pulmonary effort is normal. No respiratory distress.  Abdominal:     General: There is no distension.  Musculoskeletal:        General: Normal range of motion.     Cervical back: Normal range of motion and neck supple.  Lymphadenopathy:     Cervical: No cervical  adenopathy.  Skin:    Findings: No erythema.  Neurological:     Mental Status: He is alert and oriented to person, place, and time. Mental status is at baseline.     Motor: No abnormal muscle tone.  Psychiatric:        Mood and Affect: Mood and affect normal.      LABORATORY DATA:  I have reviewed the data as listed    Latest Ref Rng & Units 08/23/2023   10:07 AM 07/31/2023  4:03 AM 07/30/2023    5:46 AM  CBC  WBC 4.0 - 10.5 K/uL 5.0  5.4  5.8   Hemoglobin 13.0 - 17.0 g/dL 86.9  88.0  88.0   Hematocrit 39.0 - 52.0 % 40.6  36.2  36.3   Platelets 150 - 400 K/uL 217  160  124       Latest Ref Rng & Units 08/23/2023   10:07 AM 08/01/2023    2:40 AM 07/31/2023    4:03 AM  CMP  Glucose 70 - 99 mg/dL 879  75  92   BUN 8 - 23 mg/dL 26  17  19    Creatinine 0.61 - 1.24 mg/dL 8.58  9.04  8.85   Sodium 135 - 145 mmol/L 139  136  137   Potassium 3.5 - 5.1 mmol/L 4.8  4.2  4.0   Chloride 98 - 111 mmol/L 103  106  104   CO2 22 - 32 mmol/L 24  24  25    Calcium  8.9 - 10.3 mg/dL 9.4  8.3  8.3   Total Protein 6.5 - 8.1 g/dL 7.5     Total Bilirubin 0.0 - 1.2 mg/dL 0.9     Alkaline Phos 38 - 126 U/L 66     AST 15 - 41 U/L 24     ALT 0 - 44 U/L 19        RADIOGRAPHIC STUDIES: I have personally reviewed the radiological images as listed and agreed with the findings in the report. CT Head Wo Contrast Result Date: 07/29/2023 CLINICAL DATA:  Facial trauma, blunt. Patient fell getting out of bed. No loss of consciousness. Patient did not strike his head. The patient takes warfarin. EXAM: CT HEAD WITHOUT CONTRAST TECHNIQUE: Contiguous axial images were obtained from the base of the skull through the vertex without intravenous contrast. RADIATION DOSE REDUCTION: This exam was performed according to the departmental dose-optimization program which includes automated exposure control, adjustment of the mA and/or kV according to patient size and/or use of iterative reconstruction technique.  COMPARISON:  CT head without contrast 12/03/2015 FINDINGS: Brain: No acute infarct, hemorrhage, or mass lesion is present. Mild atrophy and white matter changes are stable, likely within normal limits for age. The ventricles are proportionate to the degree of atrophy. Deep brain nuclei are within normal limits. No significant extraaxial fluid collection is present. The brainstem and cerebellum are within normal limits. Midline structures are within normal limits. Vascular: Atherosclerotic calcifications are present within the cavernous internal carotid arteries and at the dural margin of both vertebral arteries. No hyperdense vessels are present. Skull: Calvarium is intact. No focal lytic or blastic lesions are present. No significant extracranial soft tissue lesion is present. Sinuses/Orbits: The paranasal sinuses and mastoid air cells are clear. Bilateral lens replacements are noted. Globes and orbits are otherwise unremarkable. IMPRESSION: 1. No acute intracranial abnormality or significant interval change. 2. Stable atrophy and white matter disease, likely within normal limits for age. Electronically Signed   By: Lonni Necessary M.D.   On: 07/29/2023 12:26   DG Chest 2 View Result Date: 07/29/2023 CLINICAL DATA:  89 year old male with history of fever. EXAM: CHEST - 2 VIEW COMPARISON:  Chest x-ray 07/05/2022. FINDINGS: Lung volumes are normal. No consolidative airspace disease. No definite pleural effusions. No pneumothorax. No evidence of pulmonary edema. Heart size is mildly enlarged. Upper mediastinal contours are within normal limits. Atherosclerotic calcifications in the thoracic aorta. Left-sided pacemaker/AICD device in place with lead tips projecting over the expected  location of the right atrium and right ventricle. IMPRESSION: 1. No radiographic evidence of acute cardiopulmonary disease. 2. Cardiomegaly. 3. Aortic atherosclerosis. Electronically Signed   By: Toribio Aye M.D.   On:  07/29/2023 08:16

## 2023-08-23 NOTE — Assessment & Plan Note (Addendum)
 Feb 2024 CT showed no suspicion of cancer. Physical examination did not reveal any organomegaly or lymphadenopathy He has chronic respiratory failure, and pulmonary cachexia may contribute to his weight loss.  He also does not eat well which may lead to weight loss. Refer to nutritionist.  No M protein on SPEP, negative flowcytometry, normal B12, normal Folate, negative hepatitis panel, negative. HIV LDH is slightly elevated, this could be explained by increased self-destruction caused by his valve disease I will hold off additional workup at this point recommend observation.  He has gained weight.

## 2023-08-23 NOTE — Assessment & Plan Note (Signed)
 Encourage oral hydration and avoid nephrotoxins.  Recommend him to repeat kidney function test when he sees PCP this month.

## 2023-08-23 NOTE — Progress Notes (Signed)
 Pt here for follow up. Pt reports he had hemoccult stool sample testing and came back negative.

## 2023-08-23 NOTE — Assessment & Plan Note (Signed)
 He was seen by urologist Dr. Virl Diamond in July 2024. Cystoscopy was negative. He follows up PRN

## 2023-09-20 ENCOUNTER — Ambulatory Visit
Admission: RE | Admit: 2023-09-20 | Discharge: 2023-09-20 | Disposition: A | Discharge status: Home / Self Care | Payer: Medicare Other | Source: Ambulatory Visit | Attending: Physician Assistant | Admitting: Physician Assistant

## 2023-09-20 ENCOUNTER — Other Ambulatory Visit: Payer: Self-pay | Admitting: Physician Assistant

## 2023-09-20 DIAGNOSIS — R531 Weakness: Secondary | ICD-10-CM

## 2023-09-20 DIAGNOSIS — R441 Visual hallucinations: Secondary | ICD-10-CM

## 2023-09-20 DIAGNOSIS — N3000 Acute cystitis without hematuria: Secondary | ICD-10-CM | POA: Diagnosis present

## 2023-10-09 ENCOUNTER — Emergency Department: Payer: Medicare Other

## 2023-10-09 ENCOUNTER — Other Ambulatory Visit: Payer: Self-pay

## 2023-10-09 ENCOUNTER — Inpatient Hospital Stay
Admit: 2023-10-09 | Discharge: 2023-10-09 | Disposition: A | Discharge status: Home / Self Care | Payer: Medicare Other | Attending: Internal Medicine | Admitting: Internal Medicine

## 2023-10-09 ENCOUNTER — Inpatient Hospital Stay
Admission: EM | Admit: 2023-10-09 | Discharge: 2023-10-13 | DRG: 291 | Disposition: E | Payer: Medicare Other | Attending: Internal Medicine | Admitting: Internal Medicine

## 2023-10-09 DIAGNOSIS — N1831 Chronic kidney disease, stage 3a: Secondary | ICD-10-CM | POA: Diagnosis present

## 2023-10-09 DIAGNOSIS — N400 Enlarged prostate without lower urinary tract symptoms: Secondary | ICD-10-CM | POA: Diagnosis present

## 2023-10-09 DIAGNOSIS — R7989 Other specified abnormal findings of blood chemistry: Secondary | ICD-10-CM | POA: Diagnosis not present

## 2023-10-09 DIAGNOSIS — E875 Hyperkalemia: Secondary | ICD-10-CM | POA: Diagnosis present

## 2023-10-09 DIAGNOSIS — N179 Acute kidney failure, unspecified: Secondary | ICD-10-CM | POA: Diagnosis not present

## 2023-10-09 DIAGNOSIS — I4811 Longstanding persistent atrial fibrillation: Secondary | ICD-10-CM | POA: Diagnosis present

## 2023-10-09 DIAGNOSIS — I130 Hypertensive heart and chronic kidney disease with heart failure and stage 1 through stage 4 chronic kidney disease, or unspecified chronic kidney disease: Principal | ICD-10-CM | POA: Diagnosis present

## 2023-10-09 DIAGNOSIS — I472 Ventricular tachycardia, unspecified: Secondary | ICD-10-CM | POA: Diagnosis present

## 2023-10-09 DIAGNOSIS — I5A Non-ischemic myocardial injury (non-traumatic): Secondary | ICD-10-CM | POA: Diagnosis not present

## 2023-10-09 DIAGNOSIS — I482 Chronic atrial fibrillation, unspecified: Secondary | ICD-10-CM | POA: Diagnosis present

## 2023-10-09 DIAGNOSIS — E785 Hyperlipidemia, unspecified: Secondary | ICD-10-CM | POA: Diagnosis present

## 2023-10-09 DIAGNOSIS — I2489 Other forms of acute ischemic heart disease: Secondary | ICD-10-CM | POA: Diagnosis present

## 2023-10-09 DIAGNOSIS — Z9841 Cataract extraction status, right eye: Secondary | ICD-10-CM

## 2023-10-09 DIAGNOSIS — Z833 Family history of diabetes mellitus: Secondary | ICD-10-CM | POA: Diagnosis not present

## 2023-10-09 DIAGNOSIS — Z79890 Hormone replacement therapy (postmenopausal): Secondary | ICD-10-CM

## 2023-10-09 DIAGNOSIS — I4729 Other ventricular tachycardia: Secondary | ICD-10-CM | POA: Diagnosis present

## 2023-10-09 DIAGNOSIS — Z789 Other specified health status: Secondary | ICD-10-CM

## 2023-10-09 DIAGNOSIS — Z66 Do not resuscitate: Secondary | ICD-10-CM | POA: Diagnosis not present

## 2023-10-09 DIAGNOSIS — Z79899 Other long term (current) drug therapy: Secondary | ICD-10-CM

## 2023-10-09 DIAGNOSIS — I5023 Acute on chronic systolic (congestive) heart failure: Secondary | ICD-10-CM | POA: Diagnosis present

## 2023-10-09 DIAGNOSIS — G9341 Metabolic encephalopathy: Secondary | ICD-10-CM | POA: Diagnosis present

## 2023-10-09 DIAGNOSIS — I420 Dilated cardiomyopathy: Secondary | ICD-10-CM | POA: Diagnosis present

## 2023-10-09 DIAGNOSIS — R4182 Altered mental status, unspecified: Principal | ICD-10-CM

## 2023-10-09 DIAGNOSIS — Z7901 Long term (current) use of anticoagulants: Secondary | ICD-10-CM | POA: Diagnosis not present

## 2023-10-09 DIAGNOSIS — Z961 Presence of intraocular lens: Secondary | ICD-10-CM | POA: Diagnosis present

## 2023-10-09 DIAGNOSIS — Z87891 Personal history of nicotine dependence: Secondary | ICD-10-CM

## 2023-10-09 DIAGNOSIS — Z95810 Presence of automatic (implantable) cardiac defibrillator: Secondary | ICD-10-CM | POA: Diagnosis not present

## 2023-10-09 DIAGNOSIS — E039 Hypothyroidism, unspecified: Secondary | ICD-10-CM | POA: Diagnosis present

## 2023-10-09 DIAGNOSIS — I950 Idiopathic hypotension: Secondary | ICD-10-CM | POA: Diagnosis present

## 2023-10-09 DIAGNOSIS — Z85828 Personal history of other malignant neoplasm of skin: Secondary | ICD-10-CM

## 2023-10-09 DIAGNOSIS — E872 Acidosis, unspecified: Secondary | ICD-10-CM | POA: Diagnosis present

## 2023-10-09 DIAGNOSIS — Z888 Allergy status to other drugs, medicaments and biological substances status: Secondary | ICD-10-CM

## 2023-10-09 DIAGNOSIS — R531 Weakness: Secondary | ICD-10-CM | POA: Diagnosis present

## 2023-10-09 DIAGNOSIS — Z1152 Encounter for screening for COVID-19: Secondary | ICD-10-CM | POA: Diagnosis not present

## 2023-10-09 DIAGNOSIS — I469 Cardiac arrest, cause unspecified: Secondary | ICD-10-CM

## 2023-10-09 DIAGNOSIS — I4891 Unspecified atrial fibrillation: Secondary | ICD-10-CM | POA: Diagnosis present

## 2023-10-09 DIAGNOSIS — Z9842 Cataract extraction status, left eye: Secondary | ICD-10-CM

## 2023-10-09 HISTORY — DX: Disorder of kidney and ureter, unspecified: N28.9

## 2023-10-09 HISTORY — DX: Idiopathic hypotension: I95.0

## 2023-10-09 LAB — TROPONIN I (HIGH SENSITIVITY)
Troponin I (High Sensitivity): 28 ng/L — ABNORMAL HIGH (ref <18)
Troponin I (High Sensitivity): 38 ng/L — ABNORMAL HIGH (ref <18)

## 2023-10-09 LAB — BRAIN NATRIURETIC PEPTIDE: B Natriuretic Peptide: 2377.9 pg/mL — ABNORMAL HIGH (ref 0.0–100.0)

## 2023-10-09 LAB — URINALYSIS, ROUTINE W REFLEX MICROSCOPIC
Bilirubin Urine: NEGATIVE
Glucose, UA: NEGATIVE mg/dL
Hgb urine dipstick: NEGATIVE
Ketones, ur: NEGATIVE mg/dL
Leukocytes,Ua: NEGATIVE
Nitrite: NEGATIVE
Protein, ur: 100 mg/dL — AB
Specific Gravity, Urine: 1.026 (ref 1.005–1.030)
pH: 5 (ref 5.0–8.0)

## 2023-10-09 LAB — COMPREHENSIVE METABOLIC PANEL
ALT: 28 U/L (ref 0–44)
AST: 56 U/L — ABNORMAL HIGH (ref 15–41)
Albumin: 3.4 g/dL — ABNORMAL LOW (ref 3.5–5.0)
Alkaline Phosphatase: 101 U/L (ref 38–126)
Anion gap: 11 (ref 5–15)
BUN: 27 mg/dL — ABNORMAL HIGH (ref 8–23)
CO2: 18 mmol/L — ABNORMAL LOW (ref 22–32)
Calcium: 9 mg/dL (ref 8.9–10.3)
Chloride: 108 mmol/L (ref 98–111)
Creatinine, Ser: 1.42 mg/dL — ABNORMAL HIGH (ref 0.61–1.24)
GFR, Estimated: 48 mL/min — ABNORMAL LOW (ref >60)
Glucose, Bld: 136 mg/dL — ABNORMAL HIGH (ref 70–99)
Potassium: 5.9 mmol/L — ABNORMAL HIGH (ref 3.5–5.1)
Sodium: 137 mmol/L (ref 135–145)
Total Bilirubin: 1.7 mg/dL — ABNORMAL HIGH (ref 0.0–1.2)
Total Protein: 6.8 g/dL (ref 6.5–8.1)

## 2023-10-09 LAB — CBC
HCT: 42.7 % (ref 39.0–52.0)
Hemoglobin: 13.2 g/dL (ref 13.0–17.0)
MCH: 29.3 pg (ref 26.0–34.0)
MCHC: 30.9 g/dL (ref 30.0–36.0)
MCV: 94.7 fL (ref 80.0–100.0)
Platelets: 259 10*3/uL (ref 150–400)
RBC: 4.51 MIL/uL (ref 4.22–5.81)
RDW: 15.4 % (ref 11.5–15.5)
WBC: 5.8 10*3/uL (ref 4.0–10.5)
nRBC: 0 % (ref 0.0–0.2)

## 2023-10-09 LAB — RESP PANEL BY RT-PCR (RSV, FLU A&B, COVID)  RVPGX2
Influenza A by PCR: NEGATIVE
Influenza B by PCR: NEGATIVE
Resp Syncytial Virus by PCR: NEGATIVE
SARS Coronavirus 2 by RT PCR: NEGATIVE

## 2023-10-09 LAB — DIFFERENTIAL
Abs Immature Granulocytes: 0.03 10*3/uL (ref 0.00–0.07)
Basophils Absolute: 0 10*3/uL (ref 0.0–0.1)
Basophils Relative: 1 %
Eosinophils Absolute: 0.1 10*3/uL (ref 0.0–0.5)
Eosinophils Relative: 2 %
Immature Granulocytes: 1 %
Lymphocytes Relative: 10 %
Lymphs Abs: 0.6 10*3/uL — ABNORMAL LOW (ref 0.7–4.0)
Monocytes Absolute: 0.7 10*3/uL (ref 0.1–1.0)
Monocytes Relative: 12 %
Neutro Abs: 4.3 10*3/uL (ref 1.7–7.7)
Neutrophils Relative %: 74 %

## 2023-10-09 LAB — LACTIC ACID, PLASMA
Lactic Acid, Venous: 2.9 mmol/L (ref 0.5–1.9)
Lactic Acid, Venous: 3.4 mmol/L (ref 0.5–1.9)

## 2023-10-09 LAB — PROTIME-INR
INR: 2.5 — ABNORMAL HIGH (ref 0.8–1.2)
Prothrombin Time: 27.3 s — ABNORMAL HIGH (ref 11.4–15.2)

## 2023-10-09 LAB — APTT: aPTT: 35 s (ref 24–36)

## 2023-10-09 LAB — ETHANOL: Alcohol, Ethyl (B): <10 mg/dL (ref <10)

## 2023-10-09 LAB — MAGNESIUM: Magnesium: 2.1 mg/dL (ref 1.7–2.4)

## 2023-10-09 MED ORDER — LACTATED RINGERS IV BOLUS
1000.0000 mL | Freq: Once | INTRAVENOUS | Status: AC
  Administered 2023-10-09: 1000 mL via INTRAVENOUS

## 2023-10-09 MED ORDER — FUROSEMIDE 10 MG/ML IJ SOLN
40.0000 mg | Freq: Two times a day (BID) | INTRAMUSCULAR | Status: DC
  Filled 2023-10-09: qty 4

## 2023-10-09 MED ORDER — HYDRALAZINE HCL 20 MG/ML IJ SOLN: 5.0000 mg | INTRAMUSCULAR | Status: DC | PRN

## 2023-10-09 MED ORDER — ACETAMINOPHEN 325 MG PO TABS: 650.0000 mg | ORAL_TABLET | Freq: Four times a day (QID) | ORAL | Status: DC | PRN

## 2023-10-09 MED ORDER — AMIODARONE LOAD VIA INFUSION
150.0000 mg | Freq: Once | INTRAVENOUS | Status: AC
  Administered 2023-10-09: 150 mg via INTRAVENOUS
  Filled 2023-10-09: qty 83.34

## 2023-10-09 MED ORDER — DM-GUAIFENESIN ER 30-600 MG PO TB12: 1.0000 | ORAL_TABLET | Freq: Two times a day (BID) | ORAL | Status: DC | PRN

## 2023-10-09 MED ORDER — SODIUM ZIRCONIUM CYCLOSILICATE 10 G PO PACK
10.0000 g | PACK | Freq: Once | ORAL | Status: AC
Start: 2023-10-09 — End: 2023-10-10
  Administered 2023-10-10: 10 g via ORAL
  Filled 2023-10-09: qty 1

## 2023-10-09 MED ORDER — AMIODARONE HCL IN DEXTROSE 360-4.14 MG/200ML-% IV SOLN
30.0000 mg/h | INTRAVENOUS | Status: DC
Start: 2023-10-09 — End: 2023-10-10
  Administered 2023-10-09 – 2023-10-10 (×2): 30 mg/h via INTRAVENOUS
  Filled 2023-10-09: qty 200

## 2023-10-09 MED ORDER — FUROSEMIDE 10 MG/ML IJ SOLN
40.0000 mg | Freq: Once | INTRAMUSCULAR | Status: AC
  Administered 2023-10-09: 40 mg via INTRAVENOUS
  Filled 2023-10-09: qty 4

## 2023-10-09 MED ORDER — ONDANSETRON HCL 4 MG/2ML IJ SOLN: 4.0000 mg | Freq: Three times a day (TID) | INTRAMUSCULAR | Status: DC | PRN

## 2023-10-09 MED ORDER — MIDODRINE HCL 5 MG PO TABS: 10.0000 mg | ORAL_TABLET | Freq: Three times a day (TID) | ORAL | Status: DC

## 2023-10-09 MED ORDER — ALBUTEROL SULFATE (2.5 MG/3ML) 0.083% IN NEBU: 2.5000 mg | INHALATION_SOLUTION | RESPIRATORY_TRACT | Status: DC | PRN

## 2023-10-09 MED ORDER — AMIODARONE HCL IN DEXTROSE 360-4.14 MG/200ML-% IV SOLN
60.0000 mg/h | INTRAVENOUS | Status: AC
  Administered 2023-10-09: 60 mg/h via INTRAVENOUS
  Filled 2023-10-09 (×2): qty 200

## 2023-10-09 NOTE — ED Notes (Signed)
 Blue top hemolyzed- lab to send someone to [facility]

## 2023-10-09 NOTE — H&P (Signed)
 History and Physical    Jack Harris ZOX:[id number] DOB: [birth year] DOA: 10/09/2023  Referring MD/NP/PA:   PCP: Gracelyn Nurse, MD   Patient coming from:  The patient is coming from home.     Chief Complaint:   HPI: Jack Harris is a 89 y.o. male with medical history significant of      Data reviewed independently and ED Course: pt was found to have     ***       EKG: I have personally reviewed.  Not done in ED, will get one.   ***   Review of Systems:   General: no fevers, chills, no body weight gain, has poor appetite, has fatigue HEENT: no blurry vision, hearing changes or sore throat Respiratory: no dyspnea, coughing, wheezing CV: no chest pain, no palpitations GI: no nausea, vomiting, abdominal pain, diarrhea, constipation GU: no dysuria, burning on urination, increased urinary frequency, hematuria  Ext: no leg edema Neuro: no unilateral weakness, numbness, or tingling, no vision change or hearing loss Skin: no rash, no skin tear. MSK: No muscle spasm, no deformity, no limitation of range of movement in spin Heme: No easy bruising.  Travel history: No recent long distant travel.   Allergy:  Allergies  Allergen Reactions   Ciprofloxacin Hives   Levaquin [Levofloxacin In D5w] Hives    Past Medical History:  Diagnosis Date   AICD (automatic cardioverter/defibrillator) present    MEDTRONIC   Aortic insufficiency    Asthma    childhood asthma   Atrial fibrillation (HCC)    Cancer (HCC)    bladder, 10 yrs ago   Cancer (HCC)    squamous cell carcinoma   Cardiomyopathy (HCC)    CHF (congestive heart failure) (HCC)    Hyperlipidemia    Hypothyroidism    Idiopathic hypotension    Paralysis of vocal cords and larynx, unilateral    Renal disorder    stage 3A CKD   Shortness of breath dyspnea     Past Surgical History:  Procedure Laterality Date   BLADDER SURGERY     Surgeon:Dr. Lonna Cobb, Location [facility]    CARDIAC DEFIBRILLATOR PLACEMENT      [facility]   EYE SURGERY Bilateral    Cataract Extraction with IOL   IMPLANTABLE CARDIOVERTER DEFIBRILLATOR (ICD) GENERATOR CHANGE Left 04/23/2019   Procedure: ICD GENERATOR CHANGE;  Surgeon: Marcina Millard, MD;  Location: [facility] ORS;  Service: Cardiovascular;  Laterality: Left;   THYROPLASTY Left 03/02/2016   Procedure: THYROPLASTY;  Surgeon: Linus Salmons, MD;  Location: [facility] ORS;  Service: ENT;  Laterality: Left;    Social History:  reports that he has never smoked. He has quit using smokeless tobacco.  His smokeless tobacco use included chew. He reports that he does not currently use alcohol. He reports that he does not use drugs.  Family History:  Family History  Problem Relation Age of Onset   Diabetes Maternal Grandmother      Prior to Admission medications   Medication Sig Start Date End Date Taking? Authorizing Provider  amiodarone (PACERONE) 200 MG tablet Take 200 mg by mouth every evening.     [provider]  atorvastatin (LIPITOR) 10 MG tablet Take 10 mg by mouth at bedtime.    [provider]  buPROPion (WELLBUTRIN) 75 MG tablet Take 75 mg by mouth 2 (two) times daily. 07/24/23   [provider]  levothyroxine (SYNTHROID) 100 MCG tablet Take 100 mcg by mouth daily before breakfast. 02/06/19   [provider]  metoprolol succinate (TOPROL-XL) 25 MG 24 hr tablet Take 12.5 mg by mouth every evening. Take with or immediately following a meal.    [provider]  midodrine (PROAMATINE) 10 MG tablet Take 10 mg by mouth 3 (three) times daily. 06/29/22   [provider]  tamsulosin (FLOMAX) 0.4 MG CAPS capsule Take 1 capsule (0.4 mg total) by mouth daily after supper. 08/01/23   Tresa Moore, MD  vitamin B-12 (CYANOCOBALAMIN) 1000 MCG tablet Take 1,000 mcg by mouth daily.    [provider]  warfarin (COUMADIN) 2.5 MG tablet Take 2.5 mg by mouth every evening.     [provider]    Physical  Exam: Vitals:   10/09/23 1745 10/09/23 1753 10/09/23 2000 10/09/23 2030  BP:   123/78 121/73  Pulse: 61   80  Resp: (!) 24  13 18   Temp:  98.7 F (37.1 C)    TempSrc:      SpO2: 100%  99% 95%  Weight:      Height:       General: Not in acute distress HEENT:       Eyes: PERRL, EOMI, no jaundice       ENT: No discharge from the ears and nose, no pharynx injection, no tonsillar enlargement.        Neck: No JVD, no bruit, no mass felt. Heme: No neck lymph node enlargement. Cardiac: S1/S2, RRR, No murmurs, No gallops or rubs. Respiratory: No rales, wheezing, rhonchi or rubs. GI: Soft, nondistended, nontender, no rebound pain, no organomegaly, BS present. GU: No hematuria Ext: No pitting leg edema bilaterally. 1+DP/PT pulse bilaterally. Musculoskeletal: No joint deformities, No joint redness or warmth, no limitation of ROM in spin. Skin: No rashes.  Neuro: Alert, oriented X3, cranial nerves II-XII grossly intact, moves all extremities normally. Muscle strength 5/5 in all extremities, sensation to light touch intact. Brachial reflex 2+ bilaterally. Knee reflex 1+ bilaterally. Negative Babinski's sign. Normal finger to nose test. Psych: Patient is not psychotic, no suicidal or hemocidal ideation.  Labs on Admission: I have personally reviewed following labs and imaging studies  CBC: Recent Labs  Lab 10/09/23 1400  WBC 5.8  NEUTROABS 4.3  HGB 13.2  HCT 42.7  MCV 94.7  PLT 259   Basic Metabolic Panel: Recent Labs  Lab 10/09/23 1400  NA 137  K 5.9*  CL 108  CO2 18*  GLUCOSE 136*  BUN 27*  CREATININE 1.42*  CALCIUM 9.0   GFR: Estimated Creatinine Clearance: 33.4 mL/min (A) (by C-G formula based on SCr of 1.42 mg/dL (H)). Liver Function Tests: Recent Labs  Lab 10/09/23 1400  AST 56*  ALT 28  ALKPHOS 101  BILITOT 1.7*  PROT 6.8  ALBUMIN 3.4*   No results for input(s): "LIPASE", "AMYLASE" in the last 168 hours. No results for input(s): "AMMONIA" in the last 168  hours. Coagulation Profile: Recent Labs  Lab 10/09/23 1546  INR 2.5*   Cardiac Enzymes: No results for input(s): "CKTOTAL", "CKMB", "CKMBINDEX", "TROPONINI" in the last 168 hours. BNP (last 3 results) No results for input(s): "PROBNP" in the last 8760 hours. HbA1C: No results for input(s): "HGBA1C" in the last 72 hours. CBG: No results for input(s): "GLUCAP" in the last 168 hours. Lipid Profile: No results for input(s): "CHOL", "HDL", "LDLCALC", "TRIG", "CHOLHDL", "LDLDIRECT" in the last 72 hours. Thyroid Function Tests: No results for input(s): "TSH", "T4TOTAL", "FREET4", "T3FREE", "THYROIDAB" in the last 72 hours. Anemia Panel: No results for  input(s): "VITAMINB12", "FOLATE", "FERRITIN", "TIBC", "IRON", "RETICCTPCT" in the last 72 hours. Urine analysis:    Component Value Date/Time   COLORURINE AMBER (A) 10/09/2023 1904   APPEARANCEUR HAZY (A) 10/09/2023 1904   APPEARANCEUR Clear 03/14/2023 1010   LABSPEC 1.026 10/09/2023 1904   LABSPEC 1.021 01/29/2012 1510   PHURINE 5.0 10/09/2023 1904   GLUCOSEU NEGATIVE 10/09/2023 1904   GLUCOSEU Negative 01/29/2012 1510   HGBUR NEGATIVE 10/09/2023 1904   BILIRUBINUR NEGATIVE 10/09/2023 1904   BILIRUBINUR Negative 03/14/2023 1010   BILIRUBINUR Negative 01/29/2012 1510   KETONESUR NEGATIVE 10/09/2023 1904   PROTEINUR 100 (A) 10/09/2023 1904   NITRITE NEGATIVE 10/09/2023 1904   LEUKOCYTESUR NEGATIVE 10/09/2023 1904   LEUKOCYTESUR Negative 01/29/2012 1510   Sepsis Labs: @LABRCNTIP (procalcitonin:4,lacticidven:4) ) Recent Results (from the past 240 hours)  Resp panel by RT-PCR (RSV, Flu A&B, Covid) Anterior Nasal Swab     Status: None   Collection Time: 10/09/23  6:19 PM   Specimen: Anterior Nasal Swab  Result Value Ref Range Status   SARS Coronavirus 2 by RT PCR NEGATIVE NEGATIVE Final    Comment: (NOTE) SARS-CoV-2 target nucleic acids are NOT DETECTED.  The SARS-CoV-2 RNA is generally detectable in upper respiratory specimens  during the acute phase of infection. The lowest concentration of SARS-CoV-2 viral copies this assay can detect is 138 copies/mL. A negative result does not preclude SARS-Cov-2 infection and should not be used as the sole basis for treatment or other patient management decisions. A negative result may occur with  improper specimen collection/handling, submission of specimen other than nasopharyngeal swab, presence of viral mutation(s) within the areas targeted by this assay, and inadequate number of viral copies(<138 copies/mL). A negative result must be combined with clinical observations, patient history, and epidemiological information. The expected result is Negative.  Fact Sheet for Patients:  BloggerCourse.com  Fact Sheet for Healthcare Providers:  SeriousBroker.it  This test is no t yet approved or cleared by the [place] FDA and  has been authorized for detection and/or diagnosis of SARS-CoV-2 by FDA under an Emergency Use Authorization (EUA). This EUA will remain  in effect (meaning this test can be used) for the duration of the COVID-19 declaration under Section 564(b)(1) of the Act, 21 U.S.C.section 360bbb-3(b)(1), unless the authorization is terminated  or revoked sooner.       Influenza A by PCR NEGATIVE NEGATIVE Final   Influenza B by PCR NEGATIVE NEGATIVE Final    Comment: (NOTE) The Xpert Xpress SARS-CoV-2/FLU/RSV plus assay is intended as an aid in the diagnosis of influenza from Nasopharyngeal swab specimens and should not be used as a sole basis for treatment. Nasal washings and aspirates are unacceptable for Xpert Xpress SARS-CoV-2/FLU/RSV testing.  Fact Sheet for Patients: BloggerCourse.com  Fact Sheet for Healthcare Providers: SeriousBroker.it  This test is not yet approved or cleared by the [place] FDA and has been authorized for detection  and/or diagnosis of SARS-CoV-2 by FDA under an Emergency Use Authorization (EUA). This EUA will remain in effect (meaning this test can be used) for the duration of the COVID-19 declaration under Section 564(b)(1) of the Act, 21 U.S.C. section 360bbb-3(b)(1), unless the authorization is terminated or revoked.     Resp Syncytial Virus by PCR NEGATIVE NEGATIVE Final    Comment: (NOTE) Fact Sheet for Patients: BloggerCourse.com  Fact Sheet for Healthcare Providers: SeriousBroker.it  This test is not yet approved or cleared by the [place] FDA and has been authorized for detection and/or diagnosis of SARS-CoV-2  by FDA under an Emergency Use Authorization (EUA). This EUA will remain in effect (meaning this test can be used) for the duration of the COVID-19 declaration under Section 564(b)(1) of the Act, 21 U.S.C. section 360bbb-3(b)(1), unless the authorization is terminated or revoked.  Performed at [facility], [address], [place], [place] [address]      Radiological Exams on Admission:   Assessment/Plan Active Problems:   * No active hospital problems. *   Assessment and Plan: No notes have been filed under this hospital service. Service: Hospitalist      Active Problems:   * No active hospital problems. *    DVT ppx: SQ Heparin         SQ Lovenox  Code Status: Full code   ***  Family Communication:     not done, no family member is at bed side.              Yes, patient's    at bed side.       by phone   ***  Disposition Plan:  Anticipate discharge back to previous environment  Consults called:    Admission status and Level of care: :    for obs as inpt        Dispo: The patient is from: {From:23814}              Anticipated d/c is to: {To:23815}              Anticipated d/c date is: {Days:23816}              Patient currently {Medically stable:23817}    Severity of  Illness:  {Observation/Inpatient:21159}       Date of Service 10/09/2023    Lorretta Harp [facility]   If 7PM-7AM, please contact night-coverage [email or web address] 10/09/2023, 9:17 PM

## 2023-10-09 NOTE — ED Notes (Addendum)
 Critical Result: Lactic Acid 2.9  Bradler, MD aware

## 2023-10-09 NOTE — ED Triage Notes (Signed)
 Pt to ED for increasing altered mental status since 3 weeks. Pt walks assisted (either with cane and 1 person or walker and 1 person). Pt has been increasingly weak since 2 days and fell today (caught by family).   Pt has been confused and not eating or drinking well. Granddaughter with pt who states family would like MRI, has appt with neurology on 3/18 but they don't want to wait. Pt has also been leaning to R for last 3 days.  Also pt has been hallucinating (seeing people and bugs) since weeks.   Saw PCP on 2/18. Started wellbutrin 1 month ago. Not sleeping well at night. Face is symmetric. No arm drift. Grip strength equal. PERRL.

## 2023-10-09 NOTE — ED Notes (Signed)
 Pacemaker interrogated.

## 2023-10-09 NOTE — ED Notes (Signed)
 This RN contacted lab to check on status of troponin that has not been shown as "in process". Lab states the order shows on their end as "cancelled", order is still visible by this RN. Lab states sometimes "they just get cancelled and we dont know why". New order for troponin placed as requested by lab. Will notify Dr Vicente Males of delay

## 2023-10-09 NOTE — ED Triage Notes (Signed)
 First Nurse Note: Patient to ED via [facility] from home for AMS. Family reports increased confusion. Started Wellbutrin 1 month ago. Hx afib, pacemaker, Stage 3 kidney disease. No recent falls noted.  108/80 94 HR 150 cbg

## 2023-10-09 NOTE — ED Provider Notes (Signed)
 [facility] Provider Note   Event Date/Time   First MD Initiated Contact with Patient 10/09/23 1536     (approximate) History  Altered Mental Status and Weakness  HPI Jack Harris is a 89 y.o. male with a past medical history of cardiomyopathy with ICD in place who presents complaining of altered mental status via family.  Patient has had episodes of increasing weakness,, altered mental status, and what family describes as hallucinations.  Patient does not have any complaints at this time ROS: Patient currently denies any vision changes, tinnitus, difficulty speaking, facial droop, sore throat, chest pain, shortness of breath, abdominal pain, nausea/vomiting/diarrhea, dysuria, or weakness/numbness/paresthesias in any extremity   Physical Exam  Triage Vital Signs: ED Triage Vitals  Encounter Vitals Group     BP 10/09/23 1341 (!) 82/72     Systolic BP Percentile --      Diastolic BP Percentile --      Pulse Rate 10/09/23 1341 (!) 131     Resp 10/09/23 1341 17     Temp 10/09/23 1341 97.7 F (36.5 C)     Temp Source 10/09/23 1341 Oral     SpO2 10/09/23 1341 96 %     Weight 10/09/23 1351 142 lb (64.4 kg)     Height 10/09/23 1351 6' (1.829 m)     Head Circumference --      Peak Flow --      Pain Score --      Pain Loc --      Pain Education --      Exclude from Growth Chart --    Most recent vital signs: Vitals:   10/09/23 2000 10/09/23 2030  BP: 123/78 121/73  Pulse:  80  Resp: 13 18  Temp:    SpO2: 99% 95%   General: Awake, oriented to self and place CV:  Good peripheral perfusion.  Resp:  Normal effort.  Abd:  No distention.  Other:  Elderly well-developed, well-nourished Caucasian male resting comfortably in no acute distress ED Results / Procedures / Treatments  Labs (all labs ordered are listed, but only abnormal results are displayed) Labs Reviewed  DIFFERENTIAL - Abnormal; Notable for the following components:      Result Value    Lymphs Abs 0.6 (*)    All other components within normal limits  COMPREHENSIVE METABOLIC PANEL - Abnormal; Notable for the following components:   Potassium 5.9 (*)    CO2 18 (*)    Glucose, Bld 136 (*)    BUN 27 (*)    Creatinine, Ser 1.42 (*)    Albumin 3.4 (*)    AST 56 (*)    Total Bilirubin 1.7 (*)    GFR, Estimated 48 (*)    All other components within normal limits  LACTIC ACID, PLASMA - Abnormal; Notable for the following components:   Lactic Acid, Venous 2.9 (*)    All other components within normal limits  LACTIC ACID, PLASMA - Abnormal; Notable for the following components:   Lactic Acid, Venous 3.4 (*)    All other components within normal limits  PROTIME-INR - Abnormal; Notable for the following components:   Prothrombin Time 27.3 (*)    INR 2.5 (*)    All other components within normal limits  BRAIN NATRIURETIC PEPTIDE - Abnormal; Notable for the following components:   B Natriuretic Peptide 2,377.9 (*)    All other components within normal limits  URINALYSIS, ROUTINE W REFLEX MICROSCOPIC - Abnormal; Notable for  the following components:   Color, Urine AMBER (*)    APPearance HAZY (*)    Protein, ur 100 (*)    Bacteria, UA RARE (*)    All other components within normal limits  TROPONIN I (HIGH SENSITIVITY) - Abnormal; Notable for the following components:   Troponin I (High Sensitivity) 28 (*)    All other components within normal limits  TROPONIN I (HIGH SENSITIVITY) - Abnormal; Notable for the following components:   Troponin I (High Sensitivity) 38 (*)    All other components within normal limits  RESP PANEL BY RT-PCR (RSV, FLU A&B, COVID)  RVPGX2  CULTURE, BLOOD (ROUTINE X 2)  CULTURE, BLOOD (ROUTINE X 2)  CBC  ETHANOL  APTT  MAGNESIUM  BASIC METABOLIC PANEL  CBC  CBG MONITORING, ED   EKG ED ECG REPORT I, Merwyn Katos, the attending physician, personally viewed and interpreted this ECG. Date: 10/09/2023 EKG Time: 1339 Rate: 128 Rhythm:  Tachycardic ventricularly paced rhythm QRS Axis: normal Intervals: normal ST/T Wave abnormalities: normal Narrative Interpretation: Tachycardic ventricularly paced rhythm.  No evidence of acute ischemia RADIOLOGY ED MD interpretation: CT of the head without contrast interpreted by me shows no evidence of acute abnormalities including no intracerebral hemorrhage, obvious masses, or significant edema 2 view chest x-ray interpreted by me shows no evidence of acute abnormalities including no pneumonia, pneumothorax, or widened mediastinum.  There is signs of cardiomegaly with pulmonary edema concerning for CHF -Agree with radiology assessment Official radiology report(s): CT HEAD WO CONTRAST Result Date: 10/09/2023 CLINICAL DATA:  Neuro deficit, acute, stroke suspected EXAM: CT HEAD WITHOUT CONTRAST TECHNIQUE: Contiguous axial images were obtained from the base of the skull through the vertex without intravenous contrast. RADIATION DOSE REDUCTION: This exam was performed according to the departmental dose-optimization program which includes automated exposure control, adjustment of the mA and/or kV according to patient size and/or use of iterative reconstruction technique. COMPARISON:  CT scan head from 09/20/2023. FINDINGS: Brain: No evidence of acute infarction, hemorrhage, hydrocephalus, extra-axial collection or mass lesion/mass effect. There is bilateral periventricular hypodensity, which is non-specific but most likely seen in the settings of microvascular ischemic changes. Mild in extent. Otherwise normal appearance of brain parenchyma. Cerebral volume loss with enlargement of the ventricles. Vascular: No hyperdense vessel or unexpected calcification. Intracranial arteriosclerosis. Skull: Normal. Negative for fracture or focal lesion. Sinuses/Orbits: No acute finding. Other: Visualized mastoid air cells are unremarkable. No mastoid effusion. IMPRESSION: *No acute intracranial abnormality.  Electronically Signed   By: Jules Schick M.D.   On: 10/09/2023 15:22   DG Chest 2 View Result Date: 10/09/2023 CLINICAL DATA:  Suspected Sepsis.  Confusion. EXAM: CHEST - 2 VIEW COMPARISON:  07/29/2023. FINDINGS: Mild diffuse pulmonary vascular congestion. There are bibasilar opacities, which may represent combination of bilateral pleural effusions with or without atelectasis/consolidation. No pneumothorax. Stable cardio-mediastinal silhouette. There is a left sided 2-lead pacemaker. No acute osseous abnormalities. The soft tissues are within normal limits. IMPRESSION: *Findings favor congestive heart failure/pulmonary edema. Correlate clinically. Electronically Signed   By: Jules Schick M.D.   On: 10/09/2023 15:20   PROCEDURES: Critical Care performed: Yes, see critical care procedure note(s) .1-3 Lead EKG Interpretation  Performed by: Merwyn Katos, MD Authorized by: Merwyn Katos, MD     Interpretation: abnormal     ECG rate:  71   ECG rate assessment: normal     Rhythm: atrial fibrillation     Ectopy: none     Conduction: normal  CRITICAL CARE Performed by: Merwyn Katos  Total critical care time: 41 minutes  Critical care time was exclusive of separately billable procedures and treating other patients.  Critical care was necessary to treat or prevent imminent or life-threatening deterioration.  Critical care was time spent personally by me on the following activities: development of treatment plan with patient and/or surrogate as well as nursing, discussions with consultants, evaluation of patient's response to treatment, examination of patient, obtaining history from patient or surrogate, ordering and performing treatments and interventions, ordering and review of laboratory studies, ordering and review of radiographic studies, pulse oximetry and re-evaluation of patient's condition.  MEDICATIONS ORDERED IN ED: Medications  amiodarone (NEXTERONE) 1.8 mg/mL load via  infusion 150 mg (150 mg Intravenous Bolus from Bag 10/09/23 1601)    Followed by  amiodarone (NEXTERONE PREMIX) 360-4.14 MG/200ML-% (1.8 mg/mL) IV infusion (60 mg/hr Intravenous Infusion Verify 10/09/23 2044)    Followed by  amiodarone (NEXTERONE PREMIX) 360-4.14 MG/200ML-% (1.8 mg/mL) IV infusion (has no administration in time range)  midodrine (PROAMATINE) tablet 10 mg (has no administration in time range)  albuterol (PROVENTIL) (2.5 MG/3ML) 0.083% nebulizer solution 2.5 mg (has no administration in time range)  dextromethorphan-guaiFENesin (MUCINEX DM) 30-600 MG per 12 hr tablet 1 tablet (has no administration in time range)  ondansetron (ZOFRAN) injection 4 mg (has no administration in time range)  hydrALAZINE (APRESOLINE) injection 5 mg (has no administration in time range)  acetaminophen (TYLENOL) tablet 650 mg (has no administration in time range)  furosemide (LASIX) injection 40 mg (has no administration in time range)  sodium zirconium cyclosilicate (LOKELMA) packet 10 g (has no administration in time range)  lactated ringers bolus 1,000 mL (0 mLs Intravenous Stopped 10/09/23 1726)  furosemide (LASIX) injection 40 mg (40 mg Intravenous Given 10/09/23 2057)   IMPRESSION / MDM / ASSESSMENT AND PLAN / ED COURSE  I reviewed the triage vital signs and the nursing notes.                             The patient is on the cardiac monitor to evaluate for evidence of arrhythmia and/or significant heart rate changes. Patient's presentation is most consistent with acute presentation with potential threat to life or bodily function.  This patient presents to the ED for concern of altered mental status, this involves an extensive number of treatment options, and is a complaint that carries with it a high risk of complications and morbidity.  The differential diagnosis includes CVA, seizure, medication overdose, ACS Co morbidities that complicate the patient evaluation  Cardiomyopathy, hypertension,  atrial fibrillation, hyperlipidemia Additional history obtained:  Additional history obtained from family at bedside  External records from outside source obtained and reviewed including internal medicine note from 10/02/2023 Lab Tests:  I Ordered, and personally interpreted labs.  The pertinent results include: INR 2.5, potassium 5.9, BNP 2377, lactic acid 2.9 with elevation to 3.4, troponin 3.8 Imaging Studies ordered:  I ordered imaging studies including head CT and chest x-ray  I independently visualized and interpreted imaging which showed head CT clear chest x-ray showing signs of CHF  I agree with the radiologist interpretation Cardiac Monitoring: / EKG:  The patient was maintained on a cardiac monitor.  I personally viewed and interpreted the cardiac monitored which showed an underlying rhythm of: Intermittent atrial fibrillation that is rate controlled as well as tachycardic ventricularly paced rhythm Consultations Obtained:  I requested consultation with the Dr. Darrold Junker  in cardiology,  and discussed lab and imaging findings as well as pertinent plan - they recommend: Continued amnio drip and admission with electrophysiology evaluation in the morning Problem List / ED Course / Critical interventions / Medication management  Intermittent ventricularly paced tachycardia, lactic acidosis, CHF exacerbation  I ordered medication including amiodarone, Lasix for ventricular tachycardia, CHF  Reevaluation of the patient after these medicines showed that the patient improved  I have reviewed the patients home medicines and have made adjustments as needed  Dispo: Admit to medicine     Clinical Course as of 10/09/23 2310  Tue Oct 09, 2023  1517 Informed by staff that this patient who is currently in the waiting room has elevated lactate of 2.9.  IV fluid orders placed.  As per triage notes, patient has not been eating and drinking well and therefore would benefit from fluid  replacement.  Patient shows no leukocytosis however will require further evaluation for other signs of infection prior to antibiotic administration. [EB]    Clinical Course User Index [EB] Merwyn Katos, MD   FINAL CLINICAL IMPRESSION(S) / ED DIAGNOSES   Final diagnoses:  Altered mental status, unspecified altered mental status type  Paced rhythm on cardiac monitor  Longstanding persistent atrial fibrillation (HCC)  Lactic acidosis   Rx / DC Orders   ED Discharge Orders     None      Note:  This document was prepared using Dragon voice recognition software and may include unintentional dictation errors.   Merwyn Katos, MD 10/09/23 [phone]

## 2023-10-10 ENCOUNTER — Other Ambulatory Visit: Payer: Self-pay

## 2023-10-10 DIAGNOSIS — I5023 Acute on chronic systolic (congestive) heart failure: Secondary | ICD-10-CM | POA: Diagnosis not present

## 2023-10-10 LAB — LACTIC ACID, PLASMA
Lactic Acid, Venous: 4.1 mmol/L (ref 0.5–1.9)
Lactic Acid, Venous: 3.1 mmol/L (ref 0.5–1.9)

## 2023-10-10 LAB — CBC
HCT: 42.9 % (ref 39.0–52.0)
Hemoglobin: 13.4 g/dL (ref 13.0–17.0)
MCH: 28.8 pg (ref 26.0–34.0)
MCHC: 31.2 g/dL (ref 30.0–36.0)
MCV: 92.1 fL (ref 80.0–100.0)
Platelets: 274 10*3/uL (ref 150–400)
RBC: 4.66 MIL/uL (ref 4.22–5.81)
RDW: 15.4 % (ref 11.5–15.5)
WBC: 6.8 10*3/uL (ref 4.0–10.5)
nRBC: 0.3 % — ABNORMAL HIGH (ref 0.0–0.2)

## 2023-10-10 LAB — ECHOCARDIOGRAM COMPLETE
Height: 72 in
P 1/2 time: 235 ms
S' Lateral: 4 cm
Weight: 2272 [oz_av]

## 2023-10-10 LAB — BASIC METABOLIC PANEL
Anion gap: 14 (ref 5–15)
BUN: 32 mg/dL — ABNORMAL HIGH (ref 8–23)
CO2: 16 mmol/L — ABNORMAL LOW (ref 22–32)
Calcium: 8.5 mg/dL — ABNORMAL LOW (ref 8.9–10.3)
Chloride: 107 mmol/L (ref 98–111)
Creatinine, Ser: 1.91 mg/dL — ABNORMAL HIGH (ref 0.61–1.24)
GFR, Estimated: 34 mL/min — ABNORMAL LOW (ref >60)
Glucose, Bld: 112 mg/dL — ABNORMAL HIGH (ref 70–99)
Potassium: 6.4 mmol/L (ref 3.5–5.1)
Sodium: 137 mmol/L (ref 135–145)

## 2023-10-10 LAB — PROTIME-INR
INR: 3.3 — ABNORMAL HIGH (ref 0.8–1.2)
Prothrombin Time: 33.6 s — ABNORMAL HIGH (ref 11.4–15.2)

## 2023-10-10 LAB — TSH: TSH: 18.118 u[IU]/mL — ABNORMAL HIGH (ref 0.350–4.500)

## 2023-10-10 LAB — CBG MONITORING, ED: Glucose-Capillary: 85 mg/dL (ref 70–99)

## 2023-10-10 LAB — GLUCOSE, CAPILLARY: Glucose-Capillary: 92 mg/dL (ref 70–99)

## 2023-10-10 LAB — RPR: RPR Ser Ql: NONREACTIVE

## 2023-10-10 LAB — POTASSIUM: Potassium: 4 mmol/L (ref 3.5–5.1)

## 2023-10-10 LAB — MRSA NEXT GEN BY PCR, NASAL: MRSA by PCR Next Gen: DETECTED — AB

## 2023-10-10 LAB — VITAMIN B12: Vitamin B-12: 1472 pg/mL — ABNORMAL HIGH (ref 180–914)

## 2023-10-10 MED ORDER — CHLORHEXIDINE GLUCONATE CLOTH 2 % EX PADS
6.0000 | MEDICATED_PAD | Freq: Every day | CUTANEOUS | Status: DC
  Administered 2023-10-10: 6 via TOPICAL
  Filled 2023-10-10: qty 6

## 2023-10-10 MED ORDER — BUPROPION HCL 75 MG PO TABS
75.0000 mg | ORAL_TABLET | Freq: Two times a day (BID) | ORAL | Status: DC
  Administered 2023-10-10 (×2): 75 mg via ORAL
  Filled 2023-10-10 (×4): qty 1

## 2023-10-10 MED ORDER — VITAMIN B-12 1000 MCG PO TABS
1000.0000 ug | ORAL_TABLET | Freq: Every day | ORAL | Status: DC
Start: 2023-10-10 — End: 2023-10-11
  Administered 2023-10-10: 1000 ug via ORAL
  Filled 2023-10-10: qty 2

## 2023-10-10 MED ORDER — SODIUM ZIRCONIUM CYCLOSILICATE 5 G PO PACK
10.0000 g | PACK | Freq: Two times a day (BID) | ORAL | Status: DC
  Administered 2023-10-10: 10 g via ORAL
  Filled 2023-10-10 (×2): qty 1

## 2023-10-10 MED ORDER — WARFARIN - PHARMACIST DOSING INPATIENT
Freq: Every day | Status: DC
  Filled 2023-10-10: qty 1

## 2023-10-10 MED ORDER — DEXTROSE 50 % IV SOLN
50.0000 mL | Freq: Once | INTRAVENOUS | Status: AC
  Administered 2023-10-10: 50 mL via INTRAVENOUS
  Filled 2023-10-10: qty 50

## 2023-10-10 MED ORDER — WARFARIN SODIUM 2.5 MG PO TABS: 2.5000 mg | ORAL_TABLET | ORAL | Status: DC

## 2023-10-10 MED ORDER — ORAL CARE MOUTH RINSE: 15.0000 mL | OROMUCOSAL | Status: DC | PRN

## 2023-10-10 MED ORDER — INSULIN ASPART 100 UNIT/ML IV SOLN
10.0000 [IU] | Freq: Once | INTRAVENOUS | Status: AC
  Administered 2023-10-10: 10 [IU] via INTRAVENOUS
  Filled 2023-10-10: qty 0.1

## 2023-10-10 MED ORDER — WARFARIN 1.25 MG HALF TABLET
1.2500 mg | ORAL_TABLET | ORAL | Status: DC
  Filled 2023-10-10: qty 1

## 2023-10-10 MED ORDER — MIRTAZAPINE 15 MG PO TABS
7.5000 mg | ORAL_TABLET | Freq: Every day | ORAL | Status: DC
  Administered 2023-10-10 (×2): 7.5 mg via ORAL
  Filled 2023-10-10 (×2): qty 1

## 2023-10-10 MED ORDER — CALCIUM GLUCONATE-NACL 1-0.675 GM/50ML-% IV SOLN
1.0000 g | Freq: Once | INTRAVENOUS | Status: AC
  Administered 2023-10-10: 1000 mg via INTRAVENOUS
  Filled 2023-10-10: qty 50

## 2023-10-10 MED ORDER — MIDODRINE HCL 5 MG PO TABS
10.0000 mg | ORAL_TABLET | Freq: Three times a day (TID) | ORAL | Status: DC
  Administered 2023-10-10 (×4): 10 mg via ORAL
  Filled 2023-10-10 (×4): qty 2

## 2023-10-10 MED ORDER — ATORVASTATIN CALCIUM 10 MG PO TABS
10.0000 mg | ORAL_TABLET | Freq: Every day | ORAL | Status: DC
  Administered 2023-10-10: 10 mg via ORAL
  Filled 2023-10-10: qty 1

## 2023-10-10 MED ORDER — LEVOTHYROXINE SODIUM 100 MCG PO TABS
100.0000 ug | ORAL_TABLET | Freq: Every day | ORAL | Status: DC
  Administered 2023-10-10: 100 ug via ORAL
  Filled 2023-10-10: qty 2
  Filled 2023-10-10: qty 1

## 2023-10-10 NOTE — Progress Notes (Signed)
 PHARMACY - ANTICOAGULATION CONSULT NOTE  Pharmacy Consult for Warfarin  Indication: atrial fibrillation  Allergies  Allergen Reactions   Ciprofloxacin Hives   Levaquin [Levofloxacin In D5w] Hives    Patient Measurements: Height: 6' (182.9 cm) Weight: 64.4 kg (142 lb) IBW/kg (Calculated) : 77.6 Heparin Dosing Weight:   Vital Signs: Temp: 98.7 F (37.1 C) (02/25 1753) BP: 121/73 (02/25 2030) Pulse Rate: 80 (02/25 2030)  Labs: Recent Labs    10/09/23 1400 10/09/23 1546 10/09/23 2052  HGB 13.2  --   --   HCT 42.7  --   --   PLT 259  --   --   APTT  --  35  --   LABPROT  --  27.3*  --   INR  --  2.5*  --   CREATININE 1.42*  --   --   TROPONINIHS 28*  --  38*    Estimated Creatinine Clearance: 33.4 mL/min (A) (by C-G formula based on SCr of 1.42 mg/dL (H)).   Medical History: Past Medical History:  Diagnosis Date   AICD (automatic cardioverter/defibrillator) present    MEDTRONIC   Aortic insufficiency    Asthma    childhood asthma   Atrial fibrillation (HCC)    Cancer (HCC)    bladder, 10 yrs ago   Cancer (HCC)    squamous cell carcinoma   Cardiomyopathy (HCC)    CHF (congestive heart failure) (HCC)    Hyperlipidemia    Hypothyroidism    Idiopathic hypotension    Paralysis of vocal cords and larynx, unilateral    Renal disorder    stage 3A CKD   Shortness of breath dyspnea     Medications:  (Not in a hospital admission)   Assessment: Pharmacy consulted to dose warfarin for Afib in this 89 year old male.  Pt was on warfarin 2.5 mg PO QOD alternating with warfarin 1.25 mg PO every other day.   Last dose warfarin 2.5 mg on 2/25 PM.    2/25: INR = 2.5  Goal of Therapy:  INR 2-3    Plan:  2/25:  INR @ 1546 = 2.5  - Will continue pt home warfarin regimen and recheck INR on 2/26 with AM labs.   Arnett Duddy D 10/10/2023,2:31 AM

## 2023-10-10 NOTE — Progress Notes (Signed)
 Heart Failure Navigator Progress Note  Assessed for Heart & Vascular TOC clinic readiness.  Patient does not meet criteria due to current [facility] patient of Dr. Darrold Junker.   Navigator will sign off at this time.  Roxy Horseman, RN, BSN [facility] Heart Failure Navigator Secure Chat Only

## 2023-10-10 NOTE — ED Notes (Signed)
 Paged hospitalist for critical result.

## 2023-10-10 NOTE — Progress Notes (Addendum)
 PHARMACY - ANTICOAGULATION CONSULT NOTE  Pharmacy Consult for Warfarin  Indication: atrial fibrillation  Allergies  Allergen Reactions   Ciprofloxacin Hives   Levaquin [Levofloxacin In D5w] Hives    Patient Measurements: Height: 6' (182.9 cm) Weight: 64.4 kg (142 lb) IBW/kg (Calculated) : 77.6 Heparin Dosing Weight:   Vital Signs: BP: 97/69 (02/26 0900) Pulse Rate: 120 (02/26 0900)  Labs: Recent Labs    10/09/23 1400 10/09/23 1546 10/09/23 2052 10/10/23 0635 10/10/23 0749 10/10/23 0901  HGB 13.2  --   --   --  13.4  --   HCT 42.7  --   --   --  42.9  --   PLT 259  --   --   --  274  --   APTT  --  35  --   --   --   --   LABPROT  --  27.3*  --   --   --  33.6*  INR  --  2.5*  --   --   --  3.3*  CREATININE 1.42*  --   --  1.91*  --   --   TROPONINIHS 28*  --  38*  --   --   --     Estimated Creatinine Clearance: 24.8 mL/min (A) (by C-G formula based on SCr of 1.91 mg/dL (H)).   Medical History: Past Medical History:  Diagnosis Date   AICD (automatic cardioverter/defibrillator) present    MEDTRONIC   Aortic insufficiency    Asthma    childhood asthma   Atrial fibrillation (HCC)    Cancer (HCC)    bladder, 10 yrs ago   Cancer (HCC)    squamous cell carcinoma   Cardiomyopathy (HCC)    CHF (congestive heart failure) (HCC)    Hyperlipidemia    Hypothyroidism    Idiopathic hypotension    Paralysis of vocal cords and larynx, unilateral    Renal disorder    stage 3A CKD   Shortness of breath dyspnea     Medications:  Warfarin 2.5 mg PO every other day, alternating with 1.25 mg PO every other day. Patient took 2.5 mg on 2/25 Patient has 2.5 mg tablets at home Weekly warfarin: ~19 mg   Assessment: 89 y.o. male with PMH NICM s/p ICD (2008, change out in 2010), aortic insufficiency, CHF, Afib, CKD3a who presents with concerns for Afib and acute on chronic heart failure. CHA2DS2VASc is at least 4 (CHF, age, CAD). Family endorses that patient has had  worsened AMS the past weeks, and has had poor appetite with decreased PO intake. INR is trending up.  Date INR Warfarin Dose 2/25 2.5 2.5 mg (PTA) 2/26 3.2 Hold  Goal of Therapy:  INR 2-3   Plan:  Hold warfarin dose today Daily INR Monitor CBC at least every 7 days for patients on warfarin  Will M. Dareen Piano, PharmD Clinical Pharmacist 10/10/2023 11:25 AM

## 2023-10-10 NOTE — Progress Notes (Signed)
 Progress Note   Patient: Jack Harris ZOX:[id number] DOB: 1933/11/28 DOA: 10/09/2023     1 DOS: the patient was seen and examined on 10/10/2023   Brief hospital course: From HPI "CRUISE BAUMGARDNER is a 89 y.o. male with medical history significant of sCHF with EF 40%, hyperlipidemia, AICD, hypothyroidism, depression, BPH, CKD-3A, bilateral cancer, idiopathic hypotension, UTI, who presents with altered mental status.   Per caregiver at the bedside, patient has been confused in the past several weeks with generalized weakness.  Patient has hallucination, seeing people and bugs.  Patient has poor appetite and decreased oral intake.  Has mild dry cough, no SOB or chest pain.  No nausea, vomiting, diarrhea or abdominal pain.  No symptoms of UTI.    Patient was found to have nonsustained V. tach and A-fib with RVR.  Heart rate up to 130s.  Amiodarone was started, heart rate improved to 80s.   Data reviewed independently and ED Course: pt was found to have WBC 5.8, BNP 2377, stable renal function, potassium of 5.9, urinalysis not impressive, negative PCR for COVID, flu and RSV, temperature normal, soft blood pressure 82/72, 121/73, RR 26, 18, oxygen saturation 93-100% on room air.  Chest x-ray showed pulmonary edema.  CT head negative.  Patient is admitted to PCU as inpatient.  Dr. Darrold Junker of cardiology is consulted by EDP (they will let EP see patient in morning).  "  Assessment and Plan:  Acute on chronic systolic CHF (congestive heart failure) (HCC): 2D echo on 12/21//23 showed EF of 40%.  Patient has trace leg edema, but has significantly elevated BNP 2377, with pulm edema by chest x-ray, clinically consistent with CHF exacerbation. Continue Lasix Monitor input and output Cardiologist following   Myocardial injury: Troponin 28, likely demand ischemia. -Lipitor -Trend troponin -Will not give aspirin since patient is on Coumadin   NSVT (nonsustained ventricular tachycardia) (HCC): Magnesium  1.2. -Amiodarone drip Cardiologist on board and case discussed   Hyperkalemia: Potassium 6.4  Patient was given hyperkalemia cocktail this morning Continue Lokelma Repeat potassium ordered   Chronic atrial fibrillation with RVR (HCC) Continue amiodarone -Coumadin   Acute metabolic encephalopathy: Etiology is not clear.  CT head negative. -Patient will need MRI, however patient has pacemaker.  Will need to discuss with cardiologist in the morning to make sure it is compatible with MRI before ordering MRI. -Check vitamin B12, RPR, TSH   HLD (hyperlipidemia) Continue Lipitor   AKI on stage 3a chronic kidney disease (HCC) possibly cardiorenal Monitor renal function closely Currently on Lasix as recommended by cardiology   Hypothyroidism Continue Synthroid   Elevated lactic acid level:  Continue to trend lactic acid levels   DVT ppx: On Coumadin   Family Communication:    Yes, patient's care ever    at bed side.       Disposition Plan:  Anticipate discharge back to previous environment   Consults called: Dr. Darrold Junker of cardiology   Admission status and Level of care: Progressive:  as inpt        Subjective:  Patient seen and examined at bedside this morning in the presence of patient's wife Have been seen by cardiologist earlier Mental status still off from baseline Denies chest pain nausea or vomiting  Physical Exam:  General: Elderly male laying in bed on intranasal oxygen Heme: No neck lymph node enlargement. Cardiac: S1/S2, RRR, No gallops or rubs. Respiratory: Has crackles bilaterally GI: Soft, nondistended, nontender, no organomegaly, BS present. GU: No hematuria Ext: Has  trace leg edema bilaterally. 1+DP/PT pulse bilaterally. Musculoskeletal: No joint deformities, No joint redness or warmth, no limitation of ROM in spin. Skin: No rashes.  Neuro: Altered moving all extremities BUT appears very weak Psych: Patient is not psychotic, no suicidal or  hemocidal ideation.    Vitals:   10/10/23 1331 10/10/23 1402 10/10/23 1543 10/10/23 1600  BP:  93/67 93/63 99/61   Pulse: (!) 115 65 (!) 108 (!) 105  Resp: (!) 24 (!) 38 (!) 21 (!) 24  Temp:      TempSrc:      SpO2: 98% 90% 96% 100%  Weight:      Height:        Data Reviewed:     Latest Ref Rng & Units 10/10/2023    7:49 AM 10/09/2023    2:00 PM 08/23/2023   10:07 AM  CBC  WBC 4.0 - 10.5 K/uL 6.8  5.8  5.0   Hemoglobin 13.0 - 17.0 g/dL 16.1  09.6  04.5   Hematocrit 39.0 - 52.0 % 42.9  42.7  40.6   Platelets 150 - 400 K/uL 274  259  217        Latest Ref Rng & Units 10/10/2023    6:35 AM 10/09/2023    2:00 PM 08/23/2023   10:07 AM  BMP  Glucose 70 - 99 mg/dL 409  811  914   BUN 8 - 23 mg/dL 32  27  26   Creatinine 0.61 - 1.24 mg/dL 7.82  9.56  2.13   Sodium 135 - 145 mmol/L 137  137  139   Potassium 3.5 - 5.1 mmol/L 6.4  5.9  4.8   Chloride 98 - 111 mmol/L 107  108  103   CO2 22 - 32 mmol/L 16  18  24    Calcium 8.9 - 10.3 mg/dL 8.5  9.0  9.4     Time spent: 56 minutes  Author: Loyce Dys, MD 10/10/2023 6:34 PM  For on call review [email or web address].

## 2023-10-10 NOTE — ED Notes (Signed)
 Floor coverage Geradine Girt) at bedside.

## 2023-10-10 NOTE — Consult Note (Signed)
 [facility] CLINIC CARDIOLOGY CONSULT NOTE       Patient ID: ZAQUAN DUFFNER MRN: [id number] DOB/AGE: 08-20-1933 89 y.o.  Admit date: 10/09/2023 Referring Physician Dr. Donna Bernard Primary Physician Gracelyn Nurse, MD  Primary Cardiologist Dr. Darrold Junker Reason for Consultation atrial fibrillation  HPI: Jack Harris is a 89 y.o. male  with a past medical history of nonischemic dilated cardiomyopathy status post ICD 2008 with change out in 2020, paroxysmal atrial fibrillation, chronic HFrEF, hypertension, hyperlipidemia, hypothyroidism who presented to the ED on 10/09/2023 for altered mental status.  Interrogation of ICD revealed atrial fibrillation with tachycardic ventricular paced rhythm.  Cardiology was consulted for further evaluation.   Patient accompanied by caregiver who provides majority of the history.  States that over the last few weeks he has been acting off.  States she took him to primary care and he did not have a UTI at that time.  Reports that during the last few weeks he is also fell and hit his head, CT was okay.  She reports that over the last few weeks he has had hallucinations and disorientation.  Symptoms have progressively worsened and thus he was brought to the ED for further evaluation.  Workup in the ED notable for creatinine 1.42, potassium 5.9, hemoglobin 13.2, WBC 5.8.  Lactic acid initially 2.9 and up trended to 3.4 > 4.1.  BNP elevated at 2300.  Troponins trended 28 > 38.  Creatinine uptrending at 1.91 today and potassium elevated at 6.4.  EKG in the ED demonstrated ventricular paced rhythm at 128 bpm.  Interrogation of device demonstrated some NSVT and atrial fibrillation.  He was started on IV amiodarone in the ED.  At the time my evaluation this morning patient is resting comfortably in ED stretcher with caregiver present at bedside.  Discussed his recent symptoms in further detail.  Caregiver reports that she noticed him having worsening shortness of breath over  the last week but denies any significant lower extremity edema.  States that he also had a cough last week and she felt like he had a rattle in his chest.  States that he has not complained of any chest pain.  His blood pressure is low at the time of my evaluation, he denies any dizziness or lightheadedness.  He states that at times he has hallucinations when he sees bugs crawling on the wall but when he tries to take a picture of this they are not there on the picture.  Review of systems complete and found to be negative unless listed above    Past Medical History:  Diagnosis Date   AICD (automatic cardioverter/defibrillator) present    [employer]   Aortic insufficiency    Asthma    childhood asthma   Atrial fibrillation (HCC)    Cancer (HCC)    bladder, 10 yrs ago   Cancer (HCC)    squamous cell carcinoma   Cardiomyopathy (HCC)    CHF (congestive heart failure) (HCC)    Hyperlipidemia    Hypothyroidism    Idiopathic hypotension    Paralysis of vocal cords and larynx, unilateral    Renal disorder    stage 3A CKD   Shortness of breath dyspnea     Past Surgical History:  Procedure Laterality Date   BLADDER SURGERY     Surgeon:Dr. Lonna Cobb, Location [facility]    CARDIAC DEFIBRILLATOR PLACEMENT     [facility]   EYE SURGERY Bilateral    Cataract Extraction with IOL   IMPLANTABLE CARDIOVERTER  DEFIBRILLATOR (ICD) GENERATOR CHANGE Left 04/23/2019   Procedure: ICD GENERATOR CHANGE;  Surgeon: Marcina Millard, MD;  Location: [facility] ORS;  Service: Cardiovascular;  Laterality: Left;   THYROPLASTY Left 03/02/2016   Procedure: THYROPLASTY;  Surgeon: Linus Salmons, MD;  Location: [facility] ORS;  Service: ENT;  Laterality: Left;    (Not in a hospital admission)  Social History   Socioeconomic History   Marital status: Married    Spouse name: Not on file   Number of children: Not on file   Years of education: Not on file   Highest education level: Not on file  Occupational History    Not on file  Tobacco Use   Smoking status: Never   Smokeless tobacco: Former    Types: Chew  Substance and Sexual Activity   Alcohol use: Not Currently    Comment: occ   Drug use: No   Sexual activity: Not on file  Other Topics Concern   Not on file  Social History Narrative   Not on file   Social Drivers of Health   Financial Resource Strain: Low Risk  (09/04/2023)   Received from [facility]   Overall Financial Resource Strain (CARDIA)    Difficulty of Paying Living Expenses: Not hard at all  Food Insecurity: No Food Insecurity (09/04/2023)   [facility] from [facility]   Hunger Vital Sign    Worried About Running Out of Food in the Last Year: Never true    Ran Out of Food in the Last Year: Never true  Transportation Needs: No Transportation Needs (09/04/2023)   Received from [facility] - Transportation    In the past 12 months, has lack of transportation kept you from medical appointments or from getting medications?: No    Lack of Transportation (Non-Medical): No  Physical Activity: Not on file  Stress: No Stress Concern Present (01/26/2023)   [employer] - Occupational Stress Questionnaire    Feeling of Stress : Only a little  Recent Concern: Stress - Stress Concern Present (01/26/2023)   [employer] - Occupational Stress Questionnaire    Feeling of Stress : To some extent  Social Connections: Not on file  Intimate Partner Violence: Not At Risk (07/30/2023)   Humiliation, Afraid, Rape, and Kick questionnaire    Fear of Current or Ex-Partner: No    Emotionally Abused: No    Physically Abused: No    Sexually Abused: No    Family History  Problem Relation Age of Onset   Diabetes Maternal Grandmother      Vitals:   10/10/23 0335 10/10/23 0430 10/10/23 0700 10/10/23 0749  BP: (!) 76/62 (!) 79/63 111/82 (!) 67/46  Pulse:  (!) 111 (!) 111 (!) 105   Resp:  16 (!) 28 (!) 28  Temp:      TempSrc:      SpO2:  97% 96% 100%  Weight:      Height:        PHYSICAL EXAM General: Ill-appearing elderly male, well nourished, in no acute distress. HEENT: Normocephalic and atraumatic. Neck: No JVD.  Lungs: Normal respiratory effort on room air.  Mild bibasilar crackles Heart: HRRR, fast rate. Normal S1 and S2 without gallops or murmurs.  Abdomen: Non-distended appearing.  Msk: Normal strength and tone for age. Extremities: Warm and well perfused. No clubbing, cyanosis.  No significant edema.  Neuro: Alert and oriented X 3. Psych: Answers questions appropriately.  Labs: Basic Metabolic Panel: Recent Labs    10/09/23 1400 10/09/23 2052 10/10/23 0635  NA 137  --  137  K 5.9*  --  6.4*  CL 108  --  107  CO2 18*  --  16*  GLUCOSE 136*  --  112*  BUN 27*  --  32*  CREATININE 1.42*  --  1.91*  CALCIUM 9.0  --  8.5*  MG  --  2.1  --    Liver Function Tests: Recent Labs    10/09/23 1400  AST 56*  ALT 28  ALKPHOS 101  BILITOT 1.7*  PROT 6.8  ALBUMIN 3.4*   No results for input(s): "LIPASE", "AMYLASE" in the last 72 hours. CBC: Recent Labs    10/09/23 1400 10/10/23 0749  WBC 5.8 6.8  NEUTROABS 4.3  --   HGB 13.2 13.4  HCT 42.7 42.9  MCV 94.7 92.1  PLT 259 274   Cardiac Enzymes: Recent Labs    10/09/23 1400 10/09/23 2052  TROPONINIHS 28* 38*   BNP: Recent Labs    10/09/23 1400  BNP 2,377.9*   D-Dimer: No results for input(s): "DDIMER" in the last 72 hours. Hemoglobin A1C: No results for input(s): "HGBA1C" in the last 72 hours. Fasting Lipid Panel: No results for input(s): "CHOL", "HDL", "LDLCALC", "TRIG", "CHOLHDL", "LDLDIRECT" in the last 72 hours. Thyroid Function Tests: Recent Labs    10/10/23 0635  TSH 18.118*   Anemia Panel: No results for input(s): "VITAMINB12", "FOLATE", "FERRITIN", "TIBC", "IRON", "RETICCTPCT" in the last 72 hours.   Radiology: CT HEAD WO CONTRAST Result Date:  10/09/2023 CLINICAL DATA:  Neuro deficit, acute, stroke suspected EXAM: CT HEAD WITHOUT CONTRAST TECHNIQUE: Contiguous axial images were obtained from the base of the skull through the vertex without intravenous contrast. RADIATION DOSE REDUCTION: This exam was performed according to the departmental dose-optimization program which includes automated exposure control, adjustment of the mA and/or kV according to patient size and/or use of iterative reconstruction technique. COMPARISON:  CT scan head from 09/20/2023. FINDINGS: Brain: No evidence of acute infarction, hemorrhage, hydrocephalus, extra-axial collection or mass lesion/mass effect. There is bilateral periventricular hypodensity, which is non-specific but most likely seen in the settings of microvascular ischemic changes. Mild in extent. Otherwise normal appearance of brain parenchyma. Cerebral volume loss with enlargement of the ventricles. Vascular: No hyperdense vessel or unexpected calcification. Intracranial arteriosclerosis. Skull: Normal. Negative for fracture or focal lesion. Sinuses/Orbits: No acute finding. Other: Visualized mastoid air cells are unremarkable. No mastoid effusion. IMPRESSION: *No acute intracranial abnormality. Electronically Signed   By: Jules Schick M.D.   On: 10/09/2023 15:22   DG Chest 2 View Result Date: 10/09/2023 CLINICAL DATA:  Suspected Sepsis.  Confusion. EXAM: CHEST - 2 VIEW COMPARISON:  07/29/2023. FINDINGS: Mild diffuse pulmonary vascular congestion. There are bibasilar opacities, which may represent combination of bilateral pleural effusions with or without atelectasis/consolidation. No pneumothorax. Stable cardio-mediastinal silhouette. There is a left sided 2-lead pacemaker. No acute osseous abnormalities. The soft tissues are within normal limits. IMPRESSION: *Findings favor congestive heart failure/pulmonary edema. Correlate clinically. Electronically Signed   By: Jules Schick M.D.   On: 10/09/2023 15:20    CT HEAD WO CONTRAST ( ) Result Date: 09/20/2023 CLINICAL DATA:  Acute cystitis without hematuria. Generalized weakness. Visual hallucinations. EXAM: CT HEAD WITHOUT CONTRAST TECHNIQUE: Contiguous axial images were obtained from the base of the skull through the vertex without intravenous contrast. RADIATION DOSE REDUCTION: This exam was performed according to the departmental dose-optimization program which includes  automated exposure control, adjustment of the mA and/or kV according to patient size and/or use of iterative reconstruction technique. COMPARISON:  Head CT 07/29/2023 FINDINGS: Brain: There is no evidence of an acute infarct, intracranial hemorrhage, mass, midline shift, or extra-axial fluid collection. Cerebral atrophy is unchanged. Cerebral white matter hypodensities are also unchanged and nonspecific but compatible with mild chronic small vessel ischemic disease. Vascular: Calcified atherosclerosis at the skull base. No hyperdense vessel. Skull: No acute fracture or suspicious osseous lesion. Sinuses/Orbits: Unchanged fluid in the left sphenoid sinus. Clear mastoid air cells. Bilateral cataract extraction. Other: None. IMPRESSION: 1. No evidence of acute intracranial abnormality. 2. Mild chronic small vessel ischemic disease. Electronically Signed   By: Sebastian Ache M.D.   On: 09/20/2023 14:20    ECHO pending  TELEMETRY reviewed by me 10/10/2023: Ventricular paced rhythm rate 110s  EKG reviewed by me: Ventricular paced rhythm 128 bpm  Data reviewed by me 10/10/2023: last 24h vitals tele labs imaging I/O ED provider note, admission H&P  Principal Problem:   Acute on chronic systolic CHF (congestive heart failure) (HCC) Active Problems:   Stage 3a chronic kidney disease (HCC)   Hypothyroidism   Acute metabolic encephalopathy   HLD (hyperlipidemia)   Chronic atrial fibrillation with RVR (HCC)   Hyperkalemia   Myocardial injury   Elevated lactic acid level   NSVT (nonsustained  ventricular tachycardia) (HCC)    ASSESSMENT AND PLAN:  Jack Harris is a 89 y.o. male  with a past medical history of nonischemic dilated cardiomyopathy status post ICD 2008 with change out in 2020, paroxysmal atrial fibrillation, chronic HFrEF, hypertension, hyperlipidemia, hypothyroidism who presented to the ED on 10/09/2023 for altered mental status.  Interrogation of ICD revealed atrial fibrillation with tachycardic ventricular paced rhythm.  Cardiology was consulted for further evaluation.   # NSVT # Paroxysmal atrial fibrillation # Acute on chronic HFrEF # Demand ischemia Patient with history of nonischemic cardiomyopathy status post ICD presented for altered mental status for the last few weeks.  Interrogation of ICD revealed episodes of NSVT and paroxysmal atrial fibrillation.  EKG demonstrated ventricular paced rhythm with elevated rates in the 110-120s.  Given his elevated rates and episodes of NSVT/A-fib on interrogation he was started on IV amiodarone.  Hypotensive this morning with systolic in the 60s. -IV amiodarone held given hypotension this a.m. -IV Lasix also held given hypotension. -Device interrogated in person by [employer] rep this morning, appears to be sinus tachycardia as the underlying rhythm. -Continue warfarin for stroke risk reduction. -Continue midodrine for BP support. -Minimally elevated and flat trending troponin most consistent with demand/supply mismatch and not ACS. -No plan for further cardiac diagnostics at this time -Consider infectious workup given lactic acidosis, sinus tachycardia, altered mental status.    This patient's plan of care was discussed and created with Dr. Darrold Junker and he is in agreement.  Signed: Gale Journey, PA-C  10/10/2023, 8:49 AM [facility] Cardiology

## 2023-10-11 ENCOUNTER — Inpatient Hospital Stay: Payer: Medicare Other

## 2023-10-11 DIAGNOSIS — I469 Cardiac arrest, cause unspecified: Secondary | ICD-10-CM

## 2023-10-11 DIAGNOSIS — I5023 Acute on chronic systolic (congestive) heart failure: Secondary | ICD-10-CM | POA: Diagnosis not present

## 2023-10-11 LAB — PROTIME-INR
INR: 6.6 (ref 0.8–1.2)
Prothrombin Time: 57.8 s — ABNORMAL HIGH (ref 11.4–15.2)

## 2023-10-11 LAB — CBC WITH DIFFERENTIAL/PLATELET
Abs Immature Granulocytes: 0.21 10*3/uL — ABNORMAL HIGH (ref 0.00–0.07)
Basophils Absolute: 0 10*3/uL (ref 0.0–0.1)
Basophils Relative: 0 %
Eosinophils Absolute: 0 10*3/uL (ref 0.0–0.5)
Eosinophils Relative: 0 %
HCT: 42.2 % (ref 39.0–52.0)
Hemoglobin: 13.1 g/dL (ref 13.0–17.0)
Immature Granulocytes: 1 %
Lymphocytes Relative: 7 %
Lymphs Abs: 1.1 10*3/uL (ref 0.7–4.0)
MCH: 29.1 pg (ref 26.0–34.0)
MCHC: 31 g/dL (ref 30.0–36.0)
MCV: 93.8 fL (ref 80.0–100.0)
Monocytes Absolute: 2 10*3/uL — ABNORMAL HIGH (ref 0.1–1.0)
Monocytes Relative: 12 %
Neutro Abs: 12.9 10*3/uL — ABNORMAL HIGH (ref 1.7–7.7)
Neutrophils Relative %: 80 %
Platelets: 192 10*3/uL (ref 150–400)
RBC: 4.5 MIL/uL (ref 4.22–5.81)
RDW: 15.4 % (ref 11.5–15.5)
WBC: 16.2 10*3/uL — ABNORMAL HIGH (ref 4.0–10.5)
nRBC: 0.7 % — ABNORMAL HIGH (ref 0.0–0.2)

## 2023-10-11 LAB — HEPATIC FUNCTION PANEL
ALT: 1113 U/L — ABNORMAL HIGH (ref 0–44)
AST: 2200 U/L — ABNORMAL HIGH (ref 15–41)
Albumin: 2.5 g/dL — ABNORMAL LOW (ref 3.5–5.0)
Alkaline Phosphatase: 87 U/L (ref 38–126)
Bilirubin, Direct: 0.7 mg/dL — ABNORMAL HIGH (ref 0.0–0.2)
Indirect Bilirubin: 0.8 mg/dL (ref 0.3–0.9)
Total Bilirubin: 1.5 mg/dL — ABNORMAL HIGH (ref 0.0–1.2)
Total Protein: 5.1 g/dL — ABNORMAL LOW (ref 6.5–8.1)

## 2023-10-11 LAB — BASIC METABOLIC PANEL
Anion gap: 16 — ABNORMAL HIGH (ref 5–15)
BUN: 43 mg/dL — ABNORMAL HIGH (ref 8–23)
CO2: 16 mmol/L — ABNORMAL LOW (ref 22–32)
Calcium: 9.6 mg/dL (ref 8.9–10.3)
Chloride: 104 mmol/L (ref 98–111)
Creatinine, Ser: 3.24 mg/dL — ABNORMAL HIGH (ref 0.61–1.24)
GFR, Estimated: 18 mL/min — ABNORMAL LOW (ref >60)
Glucose, Bld: 207 mg/dL — ABNORMAL HIGH (ref 70–99)
Potassium: 6.4 mmol/L (ref 3.5–5.1)
Sodium: 136 mmol/L (ref 135–145)

## 2023-10-11 LAB — GLUCOSE, CAPILLARY
Glucose-Capillary: 37 mg/dL — CL (ref 70–99)
Glucose-Capillary: 44 mg/dL — CL (ref 70–99)
Glucose-Capillary: 51 mg/dL — ABNORMAL LOW (ref 70–99)

## 2023-10-11 LAB — LACTIC ACID, PLASMA: Lactic Acid, Venous: 8.3 mmol/L (ref 0.5–1.9)

## 2023-10-11 LAB — TROPONIN I (HIGH SENSITIVITY): Troponin I (High Sensitivity): 591 ng/L (ref <18)

## 2023-10-11 LAB — MAGNESIUM: Magnesium: 2.4 mg/dL (ref 1.7–2.4)

## 2023-10-11 LAB — PHOSPHORUS: Phosphorus: 7 mg/dL — ABNORMAL HIGH (ref 2.5–4.6)

## 2023-10-11 MED ORDER — DEXTROSE 50 % IV SOLN
INTRAVENOUS | Status: AC
  Filled 2023-10-11: qty 50

## 2023-10-11 MED ORDER — MUPIROCIN 2 % EX OINT
1.0000 | TOPICAL_OINTMENT | Freq: Two times a day (BID) | CUTANEOUS | Status: DC
  Administered 2023-10-11: 1 via NASAL
  Filled 2023-10-11: qty 22

## 2023-10-11 MED ORDER — FENTANYL 2500MCG IN NS 250ML (10MCG/ML) PREMIX INFUSION
0.0000 ug/h | INTRAVENOUS | Status: DC
  Filled 2023-10-11: qty 250

## 2023-10-11 MED ORDER — MIDAZOLAM HCL 2 MG/2ML IJ SOLN: 2.0000 mg | INTRAMUSCULAR | Status: DC

## 2023-10-11 MED ORDER — SODIUM CHLORIDE 0.9 % IV SOLN
250.0000 mL | INTRAVENOUS | Status: DC
  Administered 2023-10-11: 250 mL via INTRAVENOUS

## 2023-10-11 MED ORDER — DEXTROSE 10 % IV SOLN: INTRAVENOUS | Status: DC

## 2023-10-11 MED ORDER — NOREPINEPHRINE 4 MG/250ML-% IV SOLN
2.0000 ug/min | INTRAVENOUS | Status: DC
  Administered 2023-10-11: 2 ug/min via INTRAVENOUS

## 2023-10-11 MED ORDER — LEVETIRACETAM IN NACL 1500 MG/100ML IV SOLN
1500.0000 mg | Freq: Once | INTRAVENOUS | Status: AC
  Administered 2023-10-11: 1500 mg via INTRAVENOUS
  Filled 2023-10-11: qty 100

## 2023-10-11 MED ORDER — MIDAZOLAM HCL 2 MG/2ML IJ SOLN
INTRAMUSCULAR | Status: DC
Start: 2023-10-11 — End: 2023-10-11
  Filled 2023-10-11: qty 2

## 2023-10-13 NOTE — Procedures (Signed)
 Intubation Procedure Note  RAYDEL HOSICK  [id number]  1933/12/18  Date:09/28/2023  Time:7:12 PM   Provider Performing:Preeya Cleckley L [place]    Procedure: Intubation (31500)  Indication(s) Respiratory Failure  Consent Unable to obtain consent due to emergent nature of procedure.   Anesthesia During CODE BLUE   Time Out Verified patient identification, verified procedure, site/side was marked, verified correct patient position, special equipment/implants available, medications/allergies/relevant history reviewed, required imaging and test results available.   Sterile Technique Usual hand hygeine, masks, and gloves were used   Procedure Description Patient positioned in bed supine.  Sedation given as noted above.  Patient was intubated with endotracheal tube using Glidescope.  View was Grade 1 full glottis .  Number of attempts was 1.  Colorimetric CO2 detector was consistent with tracheal placement.   Complications/Tolerance None; patient tolerated the procedure well. Chest X-ray is ordered to verify placement.   EBL Minimal   Specimen(s) None  Betsey Holiday, AGACNP-BC Acute Care Nurse Practitioner [facility]   [phone] / [phone] Please see Amion for details.

## 2023-10-13 NOTE — Death Summary Note (Signed)
 DEATH SUMMARY   Patient Details  Name: Jack Harris MRN: [id number] DOB: [birth year] WUJ:[id number], Beulah Gandy, MD Admission/Discharge Information   Admit Date:  2023/10/20  Date of Death: Date of Death: 22-Oct-2023  Time of Death: Time of Death: 0413  Length of Stay: 2   Principle Cause of death: Cardiac arrest  Hospital Diagnoses: Acute on chronic systolic CHF (congestive heart failure) (HCC) Myocardial injury: NSVT (nonsustained ventricular tachycardia) (HCC) Hyperkalemia:  Chronic atrial fibrillation with RVR (HCC) Acute metabolic encephalopathy:  HLD (hyperlipidemia) AKI on stage 3a chronic kidney disease (HCC) possibly cardiorenal Hypothyroidism Elevated lactic acid level   Hospital Course: Jack Harris is a 89 y.o. male with medical history significant of sCHF with EF 40%, hyperlipidemia, AICD, hypothyroidism, depression, BPH, CKD-3A, bilateral cancer, idiopathic hypotension, UTI, who presents with altered mental status. Per caregiver at the bedside, patient has been confused in the past several weeks with generalized weakness.  Patient was found to have nonsustained V. tach and A-fib with RVR.  Heart rate up to 130s.  Amiodarone was started, heart rate improved to 80s.  In the emergency room patient was seen by cardiologist.  Cardiologist discussed with electrophysiologist who reviewed patient's AICD showing concerns of ventricular rhythm.  Patient was found to have congestive heart failure however patient's amiodarone and Lasix were held by cardiologist due to worsening hypotension.  Cardiologist added midodrine for BP support.  And order was placed for in stepdown unit for closer monitoring.  Patient had cardiac arrest and underwent CPR.  Patient's wife arrived and mention patient is a DNR requested for CPR to be aborted.  Patient finally passed   Procedures: None  Consultations: Cardiology  The results of significant diagnostics from this hospitalization (including  imaging, microbiology, ancillary and laboratory) are listed below for reference.   Significant Diagnostic Studies: ECHOCARDIOGRAM COMPLETE Result Date: 10/10/2023    ECHOCARDIOGRAM REPORT   Patient Name:   Jack Harris Date of Exam: 10-20-23 Medical Rec #:  [id number]       Height:       72.0 in Accession #:    [id number]      Weight:       142.0 lb Date of Birth:  [birth year]       BSA:          1.842 m Patient Age:    89 years        BP:           79/61 mmHg Patient Gender: M               HR:           117 bpm. Exam Location:  [facility] Procedure: 2D Echo, Cardiac Doppler and Color Doppler (Both Spectral and Color            Flow Doppler were utilized during procedure). Indications:     I50.21 Acute Systolic CHF  History:         Patient has no prior history of Echocardiogram examinations.                  CHF and Cardiomyopathy, Defibrillator, Arrythmias:Atrial                  Fibrillation, Signs/Symptoms:Shortness of Breath; Risk                  Factors:Dyslipidemia.  Sonographer:     Daphine Deutscher RDCS Referring Phys:  [id number] Brien Few NIU Diagnosing Phys: Marcina Millard MD IMPRESSIONS  1. Left ventricular ejection fraction, by estimation, is 20 to 25%. The left ventricle has severely decreased function. The left ventricle has no regional wall motion abnormalities. Indeterminate diastolic filling due to E-A fusion.  2. Right ventricular systolic function is normal. The right ventricular size is normal.  3. The mitral valve is normal in structure. Mild mitral valve regurgitation. No evidence of mitral stenosis.  4. Tricuspid valve regurgitation is mild to moderate.  5. The aortic valve is normal in structure. Aortic valve regurgitation is mild. No aortic stenosis is present.  6. The inferior vena cava is normal in size with greater than 50% respiratory variability, suggesting right atrial pressure of 3 mmHg. FINDINGS  Left Ventricle: Left ventricular ejection fraction, by estimation, is 20 to 25%.  The left ventricle has severely decreased function. The left ventricle has no regional wall motion abnormalities. Strain imaging was not performed. The left ventricular internal cavity size was normal in size. There is no left ventricular hypertrophy. Indeterminate diastolic filling due to E-A fusion. Right Ventricle: The right ventricular size is normal. No increase in right ventricular wall thickness. Right ventricular systolic function is normal. Left Atrium: Left atrial size was normal in size. Right Atrium: Right atrial size was normal in size. Pericardium: There is no evidence of pericardial effusion. Mitral Valve: The mitral valve is normal in structure. Mild mitral valve regurgitation. No evidence of mitral valve stenosis. Tricuspid Valve: The tricuspid valve is normal in structure. Tricuspid valve regurgitation is mild to moderate. No evidence of tricuspid stenosis. Aortic Valve: The aortic valve is normal in structure. Aortic valve regurgitation is mild. Aortic regurgitation PHT measures 235 msec. No aortic stenosis is present. Pulmonic Valve: The pulmonic valve was normal in structure. Pulmonic valve regurgitation is not visualized. No evidence of pulmonic stenosis. Aorta: The aortic root is normal in size and structure. Venous: The inferior vena cava is normal in size with greater than 50% respiratory variability, suggesting right atrial pressure of 3 mmHg. IAS/Shunts: No atrial level shunt detected by color flow Doppler. Additional Comments: 3D imaging was not performed. A device lead is visualized.  LEFT VENTRICLE PLAX 2D LVIDd:         4.40 cm LVIDs:         4.00 cm LV PW:         0.90 cm LV IVS:        0.70 cm LVOT diam:     1.90 cm LV SV:         16 LV SV Index:   9 LVOT Area:     2.84 cm  RIGHT VENTRICLE            IVC RV Basal diam:  4.90 cm    IVC diam: 1.90 cm RV S prime:     6.09 cm/s TAPSE (M-mode): 1.4 cm LEFT ATRIUM             Index        RIGHT ATRIUM           Index LA diam:        4.10  cm 2.23 cm/m   RA Area:     28.90 cm LA Vol (A2C):   43.8 ml 23.78 ml/m  RA Volume:   109.00 ml 59.18 ml/m LA Vol (A4C):   69.2 ml 37.57 ml/m LA Biplane Vol: 60.8 ml 33.01 ml/m  AORTIC VALVE LVOT Vmax:   48.53 cm/s LVOT Vmean:  29.867 cm/s LVOT VTI:    0.056 m  AI PHT:      235 msec  AORTA Ao Root diam: 3.90 cm MV E velocity: 78.00 cm/s  TRICUSPID VALVE                            TR Peak grad:   30.7 mmHg                            TR Vmax:        277.00 cm/s                             SHUNTS                            Systemic VTI:  0.06 m                            Systemic Diam: 1.90 cm Marcina Millard MD Electronically signed by Marcina Millard MD Signature Date/Time: 10/10/2023/1:26:19 PM    Final    CT HEAD WO CONTRAST Result Date: 10/09/2023 CLINICAL DATA:  Neuro deficit, acute, stroke suspected EXAM: CT HEAD WITHOUT CONTRAST TECHNIQUE: Contiguous axial images were obtained from the base of the skull through the vertex without intravenous contrast. RADIATION DOSE REDUCTION: This exam was performed according to the departmental dose-optimization program which includes automated exposure control, adjustment of the mA and/or kV according to patient size and/or use of iterative reconstruction technique. COMPARISON:  CT scan head from 09/20/2023. FINDINGS: Brain: No evidence of acute infarction, hemorrhage, hydrocephalus, extra-axial collection or mass lesion/mass effect. There is bilateral periventricular hypodensity, which is non-specific but most likely seen in the settings of microvascular ischemic changes. Mild in extent. Otherwise normal appearance of brain parenchyma. Cerebral volume loss with enlargement of the ventricles. Vascular: No hyperdense vessel or unexpected calcification. Intracranial arteriosclerosis. Skull: Normal. Negative for fracture or focal lesion. Sinuses/Orbits: No acute finding. Other: Visualized mastoid air cells are unremarkable. No mastoid effusion. IMPRESSION: *No  acute intracranial abnormality. Electronically Signed   By: Jules Schick M.D.   On: 10/09/2023 15:22   DG Chest 2 View Result Date: 10/09/2023 CLINICAL DATA:  Suspected Sepsis.  Confusion. EXAM: CHEST - 2 VIEW COMPARISON:  07/29/2023. FINDINGS: Mild diffuse pulmonary vascular congestion. There are bibasilar opacities, which may represent combination of bilateral pleural effusions with or without atelectasis/consolidation. No pneumothorax. Stable cardio-mediastinal silhouette. There is a left sided 2-lead pacemaker. No acute osseous abnormalities. The soft tissues are within normal limits. IMPRESSION: *Findings favor congestive heart failure/pulmonary edema. Correlate clinically. Electronically Signed   By: Jules Schick M.D.   On: 10/09/2023 15:20   CT HEAD WO CONTRAST ( ) Result Date: 09/20/2023 CLINICAL DATA:  Acute cystitis without hematuria. Generalized weakness. Visual hallucinations. EXAM: CT HEAD WITHOUT CONTRAST TECHNIQUE: Contiguous axial images were obtained from the base of the skull through the vertex without intravenous contrast. RADIATION DOSE REDUCTION: This exam was performed according to the departmental dose-optimization program which includes automated exposure control, adjustment of the mA and/or kV according to patient size and/or use of iterative reconstruction technique. COMPARISON:  Head CT 07/29/2023 FINDINGS: Brain: There is no evidence of an acute infarct, intracranial hemorrhage, mass, midline shift, or extra-axial fluid collection. Cerebral atrophy is unchanged. Cerebral white matter hypodensities are also unchanged and nonspecific but compatible with mild  chronic small vessel ischemic disease. Vascular: Calcified atherosclerosis at the skull base. No hyperdense vessel. Skull: No acute fracture or suspicious osseous lesion. Sinuses/Orbits: Unchanged fluid in the left sphenoid sinus. Clear mastoid air cells. Bilateral cataract extraction. Other: None. IMPRESSION: 1. No evidence  of acute intracranial abnormality. 2. Mild chronic small vessel ischemic disease. Electronically Signed   By: Sebastian Ache M.D.   On: 09/20/2023 14:20    Microbiology: Recent Results (from the past 240 hours)  Culture, blood (Routine x 2)     Status: None (Preliminary result)   Collection Time: 10/09/23  2:00 PM   Specimen: BLOOD  Result Value Ref Range Status   Specimen Description BLOOD BLOOD LEFT FOREARM  Final   Special Requests   Final    BOTTLES DRAWN AEROBIC AND ANAEROBIC Blood Culture results may not be optimal due to an inadequate volume of blood received in culture bottles   Culture   Final    NO GROWTH 2 DAYS Performed at [facility], [address], [place], [place] [address]    Report Status PENDING  Incomplete  Culture, blood (Routine x 2)     Status: None (Preliminary result)   Collection Time: 10/09/23  3:46 PM   Specimen: BLOOD  Result Value Ref Range Status   Specimen Description BLOOD BLOOD RIGHT FOREARM  Final   Special Requests   Final    BOTTLES DRAWN AEROBIC AND ANAEROBIC Blood Culture results may not be optimal due to an inadequate volume of blood received in culture bottles   Culture   Final    NO GROWTH 2 DAYS Performed at [facility], [address], [place], [place] [address]    Report Status PENDING  Incomplete  Resp panel by RT-PCR (RSV, Flu A&B, Covid) Anterior Nasal Swab     Status: None   Collection Time: 10/09/23  6:19 PM   Specimen: Anterior Nasal Swab  Result Value Ref Range Status   SARS Coronavirus 2 by RT PCR NEGATIVE NEGATIVE Final    Comment: (NOTE) SARS-CoV-2 target nucleic acids are NOT DETECTED.  The SARS-CoV-2 RNA is generally detectable in upper respiratory specimens during the acute phase of infection. The lowest concentration of SARS-CoV-2 viral copies this assay can detect is 138 copies/mL. A negative result does not preclude SARS-Cov-2 infection and should not be used as the sole basis for treatment  or other patient management decisions. A negative result may occur with  improper specimen collection/handling, submission of specimen other than nasopharyngeal swab, presence of viral mutation(s) within the areas targeted by this assay, and inadequate number of viral copies(<138 copies/mL). A negative result must be combined with clinical observations, patient history, and epidemiological information. The expected result is Negative.  Fact Sheet for Patients:  BloggerCourse.com  Fact Sheet for Healthcare Providers:  SeriousBroker.it  This test is no t yet approved or cleared by the [place] FDA and  has been authorized for detection and/or diagnosis of SARS-CoV-2 by FDA under an Emergency Use Authorization (EUA). This EUA will remain  in effect (meaning this test can be used) for the duration of the COVID-19 declaration under Section 564(b)(1) of the Act, 21 U.S.C.section 360bbb-3(b)(1), unless the authorization is terminated  or revoked sooner.       Influenza A by PCR NEGATIVE NEGATIVE Final   Influenza B by PCR NEGATIVE NEGATIVE Final    Comment: (NOTE) The Xpert Xpress SARS-CoV-2/FLU/RSV plus assay is intended as an aid in the diagnosis of influenza from Nasopharyngeal  swab specimens and should not be used as a sole basis for treatment. Nasal washings and aspirates are unacceptable for Xpert Xpress SARS-CoV-2/FLU/RSV testing.  Fact Sheet for Patients: BloggerCourse.com  Fact Sheet for Healthcare Providers: SeriousBroker.it  This test is not yet approved or cleared by the [place] FDA and has been authorized for detection and/or diagnosis of SARS-CoV-2 by FDA under an Emergency Use Authorization (EUA). This EUA will remain in effect (meaning this test can be used) for the duration of the COVID-19 declaration under Section 564(b)(1) of the Act, 21 U.S.C. section  360bbb-3(b)(1), unless the authorization is terminated or revoked.     Resp Syncytial Virus by PCR NEGATIVE NEGATIVE Final    Comment: (NOTE) Fact Sheet for Patients: BloggerCourse.com  Fact Sheet for Healthcare Providers: SeriousBroker.it  This test is not yet approved or cleared by the [place] FDA and has been authorized for detection and/or diagnosis of SARS-CoV-2 by FDA under an Emergency Use Authorization (EUA). This EUA will remain in effect (meaning this test can be used) for the duration of the COVID-19 declaration under Section 564(b)(1) of the Act, 21 U.S.C. section 360bbb-3(b)(1), unless the authorization is terminated or revoked.  Performed at [facility] [address] [place], [place] [address]   MRSA Next Gen by PCR, Nasal     Status: Abnormal   Collection Time: 10/10/23 10:09 PM   Specimen: Nasal Mucosa; Nasal Swab  Result Value Ref Range Status   MRSA by PCR Next Gen DETECTED (A) NOT DETECTED Final    Comment: RESULT CALLED TO, READ BACK BY AND VERIFIED WITH:  Maxwell Marion AT 2347 10/10/23 JG (NOTE) The GeneXpert MRSA Assay (FDA approved for NASAL specimens only), is one component of a comprehensive MRSA colonization surveillance program. It is not intended to diagnose MRSA infection nor to guide or monitor treatment for MRSA infections. Test performance is not FDA approved in patients less than 6 years old. Performed at [facility], [address], [place], [place] [address]    Patient has been followed my shift started this morning.  Signed: Loyce Dys, MD 10/04/2023

## 2023-10-13 NOTE — ED Provider Notes (Signed)
 Responded to CODE BLUE for this patient.  Pulseless and apneic being bagged on arrival. Britton-Lee of ICU was running the code, at head of bed preparing for intubation. I stayed as backup for intubation.  Intubation by NP Cheryll Cockayne performed and uncomplicated.   Pilar Jarvis, MD 09/18/2023 [phone]

## 2023-10-13 NOTE — Procedures (Addendum)
 Cardiopulmonary Resuscitation Note  Jack Harris  [id number]  11-Feb-1934  Date:09/19/2023  Time:3:53 AM   Provider Performing:Shantee Hayne L Rust-[place]ster   Procedure: Cardiopulmonary Resuscitation ([address]50)  Indication(s) Loss of Pulse  Consent N/A  Anesthesia N/A   Time Out N/A   Sterile Technique Hand hygiene, gloves   Procedure Description Called to patient's room for CODE BLUE. Initial rhythm was PEA/Asystole. Patient received high quality chest compressions for  minutes with defibrillation or cardioversion when appropriate. Epinephrine was administered every 3 minutes as directed by time Biomedical engineer. Additional pharmacologic interventions included calcium chloride, sodium bicarbonate, and D50. Additional procedural interventions include intubation.  Return of spontaneous circulation was achieved.  Family at bedside.   Complications/Tolerance N/A   EBL N/A   Specimen(s) N/A  Estimated time to ROSC: 9 minutes  Upon ROSC, the daughter-in-law and caregiver who was on the unit just outside the room was updated on his clinical status. She informed me that the patient was a DNR/DNI but had not had the paperwork. She had confirmed with other family members the DNR status. The decision was made, since family was on their way, to leave all of the current support including the ventilator until other family members arrived.  The daughter-in-law DID confirm that the patient should be a DNR. During this conversation, the patient lost his pulse in PEA again. Time of death confirmed at 04:13.

## 2023-10-13 NOTE — Progress Notes (Signed)
   09/18/2023 0330  Spiritual Encounters  Type of Visit Initial  Care provided to: [facility] partners present during encounter Nurse  Reason for visit Code  OnCall Visit Yes   Chaplain visited patient's daughter in law during patient's CODE BLUE.  Staff got his heart back beating; however, shortly thereafter, Chaplain was called directly to return because patient expired.  Chaplain provided prayer, empathy, reflective listening and a compassionate presence.    Rev. Rana M. Earlene Plater, MDiv Chaplain Resident [facility]

## 2023-10-13 NOTE — Progress Notes (Signed)
 Black whistle, yellow colored bracelet and necklace Given to Emergency contact listed as Daughter Yash Cacciola.  Charli corn RN as witness.

## 2023-10-13 NOTE — Progress Notes (Signed)
 Patient noted by this RN to have changed rhythm to AV pace rhythm on monitor. SPO2 probe did not have good pleth so went into room to replace. Upon entering, patient noted to be unresponsive and desaturating. CODE BLUE called and CPR initiated. See CODE sheet for course of action/meds given. Patient intubated. D50 amp given x2 for hypoglycemia and D10 drip initiated. Levophed drip started for hypotension. Patient appeared to have seizure-like activity so Keppra ordered and administered. Caregiver at bedside called family and the decision was made to change patient to DNR status. Patient shortly declined afterward and time of death called at 55.  Carmel Sacramento, RN

## 2023-10-13 NOTE — Progress Notes (Signed)
 CODE BLUE NOTE  Patient Name: Jack Harris   MRN: [id number]   Date of Birth/ Sex: 03/30/1934 , male      Admission Date: 10/09/2023  Attending Provider: Loyce Dys, MD  Primary Diagnosis: Acute on chronic systolic CHF (congestive heart failure) [facility])   Cardiopulmonary Resuscitation Directed by: Cheryll Cockayne Teodoro Kil AGACNP    I personally directed ancillary staff and/or performed CPR in an effort to regain return of spontaneous circulation. In conjunction with Cheryll Cockayne /rust-Chest AGACNP  Indication: Pt was in his usual state of health until this 0 353 am, when he was noted to be PEA. Code blue was subsequently called. At the time of arrival on scene, ACLS protocol was underway.    Technical Description:  - CPR performance duration:  9  minute  - Was defibrillation or cardioversion used? No   - Was external pacer placed? No  - Was patient intubated pre/post CPR? Yes    Medications Administered: Y = Yes; Blank = No Amiodarone    Atropine    Calcium  y  Epinephrine  y  Lidocaine    Magnesium    Norepinephrine    Phenylephrine    Sodium bicarbonate  y  Vasopressin   D50 y             Post CPR evaluation:  - Final Status - Was patient successfully resuscitated ? Yes - What is current rhythm? Av paced - What is current hemodynamic status? On levophed   Miscellaneous Information:  - Labs sent, including: y  - Primary team notified?  Yes  - Family Notified? Yes  - Additional notes/ transfer status: See code blue note from ICU team. Patient with code status change after first arrest. made DNR/DNI - ultimately time of dreath 0413 am pronounced by Cheryll Cockayne Andrey Farmer AGACNP      ACLS H's and T's  -Hypovolemia  -Hypoxia -Hydrogen Ion excess (acidosis) -Hypoglycemia -Hypokalemia / Hyperkalemia  -Hypothermia  -Tension pneumothorax  -Toxins  -Thrombosis PE / MI

## 2023-10-13 DEATH — deceased

## 2023-10-14 LAB — CULTURE, BLOOD (ROUTINE X 2)
Culture: NO GROWTH
Culture: NO GROWTH
